# Patient Record
Sex: Male | Born: 1940 | Race: White | Hispanic: No | Marital: Married | Smoking: Never smoker
Health system: Southern US, Community
[De-identification: ages and names within clinical notes are randomized; demographics above are authoritative.]

## PROBLEM LIST (undated history)

## (undated) DIAGNOSIS — I872 Venous insufficiency (chronic) (peripheral): Secondary | ICD-10-CM

## (undated) DIAGNOSIS — M419 Scoliosis, unspecified: Secondary | ICD-10-CM

## (undated) DIAGNOSIS — M549 Dorsalgia, unspecified: Secondary | ICD-10-CM

## (undated) DIAGNOSIS — M1990 Unspecified osteoarthritis, unspecified site: Secondary | ICD-10-CM

## (undated) DIAGNOSIS — E039 Hypothyroidism, unspecified: Secondary | ICD-10-CM

## (undated) DIAGNOSIS — I480 Paroxysmal atrial fibrillation: Secondary | ICD-10-CM

## (undated) DIAGNOSIS — N419 Inflammatory disease of prostate, unspecified: Secondary | ICD-10-CM

## (undated) DIAGNOSIS — C069 Malignant neoplasm of mouth, unspecified: Secondary | ICD-10-CM

## (undated) DIAGNOSIS — I2510 Atherosclerotic heart disease of native coronary artery without angina pectoris: Secondary | ICD-10-CM

## (undated) DIAGNOSIS — T8859XA Other complications of anesthesia, initial encounter: Secondary | ICD-10-CM

## (undated) DIAGNOSIS — D696 Thrombocytopenia, unspecified: Secondary | ICD-10-CM

## (undated) HISTORY — PX: PILONIDAL CYST EXCISION: SHX744

## (undated) HISTORY — DX: Thrombocytopenia, unspecified: D69.6

## (undated) HISTORY — PX: SEPTOPLASTY: SUR1290

## (undated) HISTORY — DX: Malignant neoplasm of mouth, unspecified: C06.9

## (undated) HISTORY — PX: PROSTATE BIOPSY: SHX241

## (undated) HISTORY — PX: OTHER SURGICAL HISTORY: SHX169

## (undated) HISTORY — DX: Inflammatory disease of prostate, unspecified: N41.9

## (undated) HISTORY — PX: CATARACT EXTRACTION: SUR2

## (undated) HISTORY — DX: Venous insufficiency (chronic) (peripheral): I87.2

## (undated) HISTORY — PX: TONSILLECTOMY: SUR1361

## (undated) HISTORY — DX: Hypothyroidism, unspecified: E03.9

## (undated) HISTORY — DX: Paroxysmal atrial fibrillation: I48.0

## (undated) HISTORY — PX: LEFT HEART CATH: SHX5946

## (undated) HISTORY — DX: Atherosclerotic heart disease of native coronary artery without angina pectoris: I25.10

## (undated) HISTORY — DX: Scoliosis, unspecified: M41.9

## (undated) HISTORY — DX: Dorsalgia, unspecified: M54.9

## (undated) HISTORY — PX: KNEE ARTHROSCOPY: SUR90

---

## 2015-03-03 DIAGNOSIS — I82409 Acute embolism and thrombosis of unspecified deep veins of unspecified lower extremity: Secondary | ICD-10-CM

## 2015-03-03 HISTORY — DX: Acute embolism and thrombosis of unspecified deep veins of unspecified lower extremity: I82.409

## 2015-08-12 DIAGNOSIS — R002 Palpitations: Secondary | ICD-10-CM | POA: Insufficient documentation

## 2015-08-12 DIAGNOSIS — R609 Edema, unspecified: Secondary | ICD-10-CM | POA: Insufficient documentation

## 2015-08-12 DIAGNOSIS — G4733 Obstructive sleep apnea (adult) (pediatric): Secondary | ICD-10-CM | POA: Insufficient documentation

## 2015-08-12 DIAGNOSIS — R0683 Snoring: Secondary | ICD-10-CM | POA: Insufficient documentation

## 2015-08-12 DIAGNOSIS — I10 Essential (primary) hypertension: Secondary | ICD-10-CM | POA: Insufficient documentation

## 2015-08-12 DIAGNOSIS — I4891 Unspecified atrial fibrillation: Secondary | ICD-10-CM | POA: Insufficient documentation

## 2016-03-23 DIAGNOSIS — C4442 Squamous cell carcinoma of skin of scalp and neck: Secondary | ICD-10-CM | POA: Insufficient documentation

## 2016-04-13 DIAGNOSIS — C01 Malignant neoplasm of base of tongue: Secondary | ICD-10-CM | POA: Insufficient documentation

## 2017-11-02 DIAGNOSIS — R600 Localized edema: Secondary | ICD-10-CM | POA: Insufficient documentation

## 2018-10-03 DIAGNOSIS — Z923 Personal history of irradiation: Secondary | ICD-10-CM | POA: Insufficient documentation

## 2018-10-03 DIAGNOSIS — Z85819 Personal history of malignant neoplasm of unspecified site of lip, oral cavity, and pharynx: Secondary | ICD-10-CM | POA: Insufficient documentation

## 2018-12-19 ENCOUNTER — Telehealth: Payer: Self-pay | Admitting: Family Medicine

## 2018-12-19 NOTE — Telephone Encounter (Signed)
Patient has a new patient appointment on 12/26/18 with Dr.Duncan.  Patient wants to know if Dr.Duncan received records from [facility],patient's previous doctor.

## 2018-12-20 NOTE — Telephone Encounter (Signed)
I have the records.  Thanks.

## 2018-12-20 NOTE — Telephone Encounter (Signed)
Left detailed message on voicemail.  

## 2018-12-20 NOTE — Telephone Encounter (Signed)
Pt aware.

## 2018-12-26 ENCOUNTER — Other Ambulatory Visit: Payer: Self-pay

## 2018-12-26 ENCOUNTER — Ambulatory Visit (INDEPENDENT_AMBULATORY_CARE_PROVIDER_SITE_OTHER): Payer: Medicare Other | Admitting: Family Medicine

## 2018-12-26 ENCOUNTER — Encounter: Payer: Self-pay | Admitting: Family Medicine

## 2018-12-26 VITALS — BP 150/76 | HR 57 | Temp 97.5°F | Ht 70.0 in | Wt 165.2 lb

## 2018-12-26 DIAGNOSIS — I480 Paroxysmal atrial fibrillation: Secondary | ICD-10-CM

## 2018-12-26 DIAGNOSIS — E039 Hypothyroidism, unspecified: Secondary | ICD-10-CM

## 2018-12-26 DIAGNOSIS — C069 Malignant neoplasm of mouth, unspecified: Secondary | ICD-10-CM | POA: Diagnosis not present

## 2018-12-26 DIAGNOSIS — N419 Inflammatory disease of prostate, unspecified: Secondary | ICD-10-CM | POA: Diagnosis not present

## 2018-12-26 DIAGNOSIS — D696 Thrombocytopenia, unspecified: Secondary | ICD-10-CM | POA: Diagnosis not present

## 2018-12-26 DIAGNOSIS — Z0000 Encounter for general adult medical examination without abnormal findings: Secondary | ICD-10-CM

## 2018-12-26 DIAGNOSIS — Z7189 Other specified counseling: Secondary | ICD-10-CM

## 2018-12-26 DIAGNOSIS — R300 Dysuria: Secondary | ICD-10-CM

## 2018-12-26 DIAGNOSIS — I872 Venous insufficiency (chronic) (peripheral): Secondary | ICD-10-CM

## 2018-12-26 MED ORDER — CEFUROXIME AXETIL 500 MG PO TABS: 500.0000 mg | ORAL_TABLET | Freq: Two times a day (BID) | ORAL | 0 refills | Status: DC

## 2018-12-26 NOTE — Patient Instructions (Signed)
Urine culture today.  We'll call about urology and cardiology.  Update me as needed.  Start ceftin today.  Take care.  Glad to see you.

## 2018-12-26 NOTE — Progress Notes (Signed)
New patient.  Establishing care.  I will check back through his old records and update his health maintenance items.    Tetanus shot 2020. Flu shot 2020. Pneumonia shot up-to-date Shingrix up-to-date. Prostate cancer screening discussed with patient.  He has history of negative prostate biopsy.  He has a history of prostatitis.  Recent PSA is slightly elevated in spite of finasteride use.  See discussion of prostatitis below. Colonoscopy 2014. Living will discussed with patient.  Wife designated patient were incapacitated.  History of tongue cancer.  Followed at [facility].  No active treatment at this point.  No known active disease at this point.  He had dental exam this morning that was normal per his report without remarkable oral lesions noted.  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  Compliant.  Recent TSH minimally elevated.  Discussed with patient.  No adverse effect of medication.  No dysphagia.  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  History of A. fib.  He has occasional symptoms thought to be due to paroxysmal atrial fibrillation.  BP controlled at home.   He likely has a whitecoat component with his blood pressure.  His CHADS2 score is 1, when using his controlled home blood pressures for scoring.  Discussed with patient.  No chest pain, shortness of breath, edema.  He has not been on anticoagulation previously.  History of venous insufficiency noted and he wears a compression stocking on the right leg at baseline  History of prostatitis.  Required previous treatment with Ceftin.  Fluoroquinolone intolerant.  Over the last few weeks he has had burning with urination that was similar to previous episodes.  No fevers.  No chills.  PMH and SH reviewed  ROS: Per HPI unless specifically indicated in ROS section   Meds, vitals, and allergies reviewed.   GEN: nad, alert and oriented HEENT: Wearing a mask due to Covid  restrictions. ncat NECK: supple w/o LA CV: rrr.  PULM: ctab, no inc wob ABD: soft, +bs EXT: no edema, right leg and compression stocking. SKIN: no acute rash

## 2018-12-28 ENCOUNTER — Encounter: Payer: Self-pay | Admitting: Family Medicine

## 2018-12-28 DIAGNOSIS — I872 Venous insufficiency (chronic) (peripheral): Secondary | ICD-10-CM | POA: Insufficient documentation

## 2018-12-28 DIAGNOSIS — C069 Malignant neoplasm of mouth, unspecified: Secondary | ICD-10-CM | POA: Insufficient documentation

## 2018-12-28 DIAGNOSIS — Z7189 Other specified counseling: Secondary | ICD-10-CM | POA: Insufficient documentation

## 2018-12-28 DIAGNOSIS — I480 Paroxysmal atrial fibrillation: Secondary | ICD-10-CM | POA: Insufficient documentation

## 2018-12-28 DIAGNOSIS — D696 Thrombocytopenia, unspecified: Secondary | ICD-10-CM | POA: Insufficient documentation

## 2018-12-28 DIAGNOSIS — Z0000 Encounter for general adult medical examination without abnormal findings: Secondary | ICD-10-CM | POA: Insufficient documentation

## 2018-12-28 DIAGNOSIS — E039 Hypothyroidism, unspecified: Secondary | ICD-10-CM | POA: Insufficient documentation

## 2018-12-28 DIAGNOSIS — N419 Inflammatory disease of prostate, unspecified: Secondary | ICD-10-CM | POA: Insufficient documentation

## 2018-12-28 LAB — URINE CULTURE
MICRO NUMBER:: 1029578
SPECIMEN QUALITY:: ADEQUATE

## 2018-12-28 NOTE — Assessment & Plan Note (Signed)
Reasonable to check urine culture.  I expect this to be negative but still reasonable to check.  Discussed with patient.  Presumed prostatitis given his symptoms and history.  History of PSA elevation in spite of finasteride use, based on outside labs.  Did not recheck PSA today because of likely prostatitis.  Start Ceftin.  Refer to urology.  He agrees to plan. >30 minutes spent in face to face time with patient, >50% spent in counselling or coordination of care

## 2018-12-28 NOTE — Assessment & Plan Note (Signed)
Wife designated if patient were incapacitated.  

## 2018-12-28 NOTE — Assessment & Plan Note (Signed)
History of, noted, no bleeding.  We can follow this episodically.

## 2018-12-28 NOTE — Assessment & Plan Note (Signed)
CHADS2 score of 1 using his lower blood pressures at home.  No change in medication at this point.  Refer to cardiology.  He agrees with plan.

## 2018-12-28 NOTE — Assessment & Plan Note (Signed)
Tetanus shot 2020. Flu shot 2020. Pneumonia shot up-to-date Shingrix up-to-date. Prostate cancer screening discussed with patient.  He has history of negative prostate biopsy.  He has a history of prostatitis.  Recent PSA is slightly elevated in spite of finasteride use.  See discussion of prostatitis below. Colonoscopy 2014. Living will discussed with patient.  Wife designated patient were incapacitated.

## 2018-12-28 NOTE — Assessment & Plan Note (Signed)
Continue using compression stocking as needed.

## 2018-12-28 NOTE — Assessment & Plan Note (Signed)
TSH minimally elevated on recent check.  No change in meds at this point.  I do not want overtreat his hypothyroidism and I prefer him to see cardiology before we change any of his medications around.  He agrees.  See paroxysmal atrial fibrillation discussion.

## 2018-12-28 NOTE — Assessment & Plan Note (Signed)
Followed by [facility] cancer clinic.  I will defer.  He agrees.

## 2018-12-29 ENCOUNTER — Ambulatory Visit: Payer: Medicare Other | Admitting: Cardiology

## 2018-12-30 ENCOUNTER — Encounter: Payer: Self-pay | Admitting: Cardiology

## 2018-12-30 ENCOUNTER — Ambulatory Visit (INDEPENDENT_AMBULATORY_CARE_PROVIDER_SITE_OTHER): Payer: Medicare Other | Admitting: Cardiology

## 2018-12-30 ENCOUNTER — Other Ambulatory Visit: Payer: Self-pay

## 2018-12-30 ENCOUNTER — Ambulatory Visit (INDEPENDENT_AMBULATORY_CARE_PROVIDER_SITE_OTHER): Payer: Medicare Other

## 2018-12-30 VITALS — BP 160/82 | HR 51 | Ht 70.5 in | Wt 167.5 lb

## 2018-12-30 DIAGNOSIS — R030 Elevated blood-pressure reading, without diagnosis of hypertension: Secondary | ICD-10-CM

## 2018-12-30 DIAGNOSIS — I480 Paroxysmal atrial fibrillation: Secondary | ICD-10-CM

## 2018-12-30 DIAGNOSIS — I77810 Thoracic aortic ectasia: Secondary | ICD-10-CM | POA: Diagnosis not present

## 2018-12-30 DIAGNOSIS — I340 Nonrheumatic mitral (valve) insufficiency: Secondary | ICD-10-CM | POA: Diagnosis not present

## 2018-12-30 MED ORDER — APIXABAN 5 MG PO TABS: 5.0000 mg | ORAL_TABLET | Freq: Two times a day (BID) | ORAL | 5 refills | Status: DC

## 2018-12-30 NOTE — Progress Notes (Signed)
Cardiology Office Note:    Date:  12/30/2018   ID:  Norman Mitchell, DOB Jan 11, 1941, MRN [id number]  PCP:  Tonia Ghent, MD  Cardiologist:  Kate Sable, MD  Electrophysiologist:  None   Referring MD: Tonia Ghent, MD   Chief Complaint  Patient presents with   other    Ref by Dr. Damita Dunnings for arrhythmia.Echo 10/2016 @ [facility] in [place], [place]. Meds reviewed by the pt. verbally.  Pt. c/o palpitatioins at rest.     History of Present Illness:    Norman Mitchell is a 78 y.o. male with a hx of DVT, paroxysmal atrial fibrillation (diagnosed May 2017) who presents due to history of atrial fibrillation.  Patient has been having palpitation for years, eventually an EKG done at his primary care provider's office in Jul 02, 2015 showed atrial fibrillation with a ventricular rate of 101.  He was later on referred to see a cardiologist, and was evaluated by Dr. Nash Dimmer in June 2017.Marland Kitchen  At the time his CHA2DS2-VASc score was 1.  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  He was managed with anticoagulation for 3 months with resolution.  He was later diagnosed with throat cancer and his malignancy was thought to cause the DVT.  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  Patient feels fine, states having palpitations once or twice a month, denies chest pain, dizziness, edema.  He is a never smoker.  States his blood pressure at home usually in the 120s to 130s.  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   Paroxysmal atrial fibrillation (HCC)    Prostatitis    Thrombocytopenia (HCC)    Venous insufficiency     Past Surgical  History:  Procedure Laterality Date   KNEE ARTHROSCOPY     Oral biopsy     PILONIDAL CYST EXCISION     PROSTATE BIOPSY     Negative   SEPTOPLASTY     TONSILLECTOMY      Current Medications: Current Meds  Medication Sig   albuterol (VENTOLIN HFA) 108 (90 Base) MCG/ACT inhaler Inhale into the lungs as needed.    alfuzosin (UROXATRAL) 10 MG 24 hr tablet Take by mouth daily.    aspirin EC 81 MG tablet Take 81 mg by mouth daily.    cefUROXime (CEFTIN) 500 MG tablet Take 1 tablet (500 mg total) by mouth 2 (two) times daily with a meal.   CRANBERRY EXTRACT PO Take 500 mg by mouth 3 (three) times a week.    Fiber, Corn Dextrin, POWD Take 2 tsp per day   finasteride (PROSCAR) 5 MG tablet Take 5 mg by mouth daily.    levothyroxine (SYNTHROID) 75 MCG tablet Take 75 mcg by mouth daily.    Lutein 20 MG CAPS Take by mouth.   Multiple Vitamins-Minerals (SENIOR MULTIVITAMIN PLUS PO)    NON FORMULARY Cocoavia 750 mg daily.   Omega-3 1000 MG CAPS Take by mouth 3 (three) times daily.      Allergies:   Sulfamethoxazole-trimethoprim, Ciprofloxacin, Ibuprofen, Levofloxacin, and Sulfa antibiotics   Social History   Socioeconomic History   Marital status: Married    Spouse name:  Not on file   Number of children: Not on file   Years of education: Not on file   Highest education level: Not on file  Occupational History   Not on file  Social Needs   Financial resource strain: Not on file   Food insecurity    Worry: Not on file    Inability: Not on file   Transportation needs    Medical: Not on file    Non-medical: Not on file  Tobacco Use   Smoking status: Never Smoker   Smokeless tobacco: Never Used  Substance and Sexual Activity   Alcohol use: Never    Frequency: Never   Drug use: Never   Sexual activity: Not on file  Lifestyle   Physical activity    Days per week: Not on file    Minutes per session: Not on file   Stress: Not on file  Relationships    Social connections    Talks on phone: Not on file    Gets together: Not on file    Attends religious service: Not on file    Active member of club or organization: Not on file    Attends meetings of clubs or organizations: Not on file    Relationship status: Not on file  Other Topics Concern   Not on file  Social History Narrative   From [place], [place].    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   [employer] [phone] through 1972.  87.  No service related issues.   Enjoys hiking, paining, Scientist, water quality, woodworking   Lives at [facility].     Family History: The patient's family history includes Heart disease in his mother; High Cholesterol in his father; High blood pressure in his father; Prostate cancer in his paternal grandfather; Stroke in his father. There is no history of Colon cancer.  ROS:   Please see the history of present illness.     All other systems reviewed and are negative.  EKGs/Labs/Other Studies Reviewed:    The following studies were reviewed today: Echocardiogram date 11/16/2016 Summary:  1. The left ventricular chamber size is normal. There is normal left ventricular systolic function. The EF is estimated at 60-65%. 2. There is mild to moderate mitral and tricuspid regurgitation. 3. There is mild dilatation of the ascending aorta.-4.0 cm. 4. There is a trivial amount of aortic regurgitation. 5. Compared to prior report of 2017, there are no sig changes.   Findings     Technical Comments: The study quality is fair.    Left Ventricle: The left ventricular chamber size is normal. There is normal left ventricular systolic function. The EF is estimated at 60-65%.   Left Atrium: The left atrium is mildly dilated.   Right Ventricle: The right ventricular cavity size is normal. The right ventricular global systolic function is normal.   Right Atrium: The right atrium is mild to  moderately dilated.    Aortic Valve: The aortic valve is trileaflet. There is mild sclerosis of the aortic valve cusps. There is a trivial amount of aortic regurgitation. There is no evidence of aortic stenosis.   Mitral Valve: There is mild mitral leaflet thickening. There is mild to moderate mitral regurgitation observed.   Tricuspid Valve: The tricuspid valve is redundant. There is mild to moderate tricuspid regurgitation. The right ventricular systolic pressure is calculated at ( )mmHg.   Pulmonic Valve: The pulmonic valve is not well visualized.  There is mild pulmonic regurgitation present.   Pericardium: The pericardium appears normal. There is no pericardial effusion.   Aorta: The aortic root appears normal. There is mild dilatation of the ascending aorta.   EKG:  EKG is  ordered today.  The ekg ordered today demonstrates sinus bradycardia, right bundle branch block.  Recent Labs: No results found for requested labs within last 8760 hours.  Recent Lipid Panel No results found for: CHOL, TRIG, HDL, CHOLHDL, VLDL, LDLCALC, LDLDIRECT  Physical Exam:    VS:  BP (!) 160/82 (BP Location: Right Arm, Patient Position: Sitting, Cuff Size: Normal)    Pulse (!) 51    Ht 5' 10.5" (1.791 m)    Wt 167 lb 8 oz (76 kg)    BMI 23.69 kg/m     Wt Readings from Last 3 Encounters:  12/30/18 167 lb 8 oz (76 kg)  12/26/18 165 lb 3 oz (74.9 kg)     GEN:  Well nourished, well developed in no acute distress HEENT: Normal NECK: No JVD; No carotid bruits LYMPHATICS: No lymphadenopathy CARDIAC: Regular, bradycardic, no murmurs, rubs, gallops RESPIRATORY:  Clear to auscultation without rales, wheezing or rhonchi  ABDOMEN: Soft, non-tender, non-distended MUSCULOSKELETAL:  No edema; No deformity  SKIN: Warm and dry NEUROLOGIC:  Alert and oriented x 3 PSYCHIATRIC:  Normal affect   ASSESSMENT:   Patient has elevated blood pressure in the office.  His blood pressure at  home is usually in the 120s to 130s.  Patient may have whitecoat hypertension. His chads vas score is at least 2 (age-2, ?hypertension-1).  He is currently in sinus bradycardia with heart rate 51.  1. PAF (paroxysmal atrial fibrillation) ([place])   2. Mitral valve insufficiency, unspecified etiology   3. 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 Start Eliquis 5 mg twice daily.  Get echocardiogram.  Get Zio patch to document A. fib burden. 2.  Echocardiogram as above. 3.  Echocardiogram as above. 4.  General check blood pressure daily and keep a log.  If blood pressures elevated at home we will plan to start an antihypertensive.  Total encounter time more than 60 minutes  Greater than 50% was spent in counseling and coordination of care with the patient  This note was generated in part or whole with voice recognition software. Voice recognition is usually quite accurate but there are transcription errors that can and very often do occur. I apologize for any typographical errors that were not detected and corrected.   Medication Adjustments/Labs and Tests Ordered: Current medicines are reviewed at length with the patient today.  Concerns regarding medicines are outlined above.  Orders Placed This Encounter  Procedures   LONG TERM MONITOR (3-14 DAYS)   EKG 12-Lead   ECHOCARDIOGRAM COMPLETE   Meds ordered this encounter  Medications   apixaban (ELIQUIS) 5 MG TABS tablet    Sig: Take 1 tablet (5 mg total) by mouth 2 (two) times daily.    Dispense:  60 tablet    Refill:  5    Patient Instructions  Medication Instructions:  Your physician has recommended you make the following change in your medication:   START Eliquis 5mg  twice daily. An Rx has been sent to your pharmacy.  *If you need a refill on your cardiac medications before your next appointment, please call your pharmacy*  Lab Work: None ordered If you have  labs (blood work) drawn  today and your tests are completely normal, you will receive your results only by:  MyChart Message (if you have MyChart) OR  A paper copy in the mail If you have any lab test that is abnormal or we need to change your treatment, we will call you to review the results.  Testing/Procedures: Your physician has requested that you have an echocardiogram. Echocardiography is a painless test that uses sound waves to create images of your heart. It provides your doctor with information about the size and shape of your heart and how well your hearts chambers and valves are working. This procedure takes approximately one hour. There are no restrictions for this procedure.  Your physician has recommended that you wear an zio monitor. Zio monitors are medical devices that record the hearts electrical activity. Doctors most often [place] these monitors to diagnose arrhythmias. Arrhythmias are problems with the speed or rhythm of the heartbeat. The monitor is a small, portable device. You can wear one while you do your normal daily activities. This is usually used to diagnose what is causing palpitations/syncope (passing out).    Follow-Up: At [facility], you and your health needs are our priority.  As part of our continuing mission to provide you with exceptional heart care, we have created designated Provider Care Teams.  These Care Teams include your primary Cardiologist (physician) and Advanced Practice Providers (APPs -  Physician Assistants and Nurse Practitioners) who all work together to provide you with the care you need, when you need it.  Your next appointment:   2 months  The format for your next appointment:   In Person  Provider:    You may see Kate Sable, MD or one of the following Advanced Practice Providers on your designated Care Team:    Murray Hodgkins, NP  Christell Faith, PA-C  Marrianne Mood, PA-C   Other Instructions  Your physician has  recommended that you wear a Zio monitor. This monitor is a medical device that records the hearts electrical activity. Doctors most often use these monitors to diagnose arrhythmias. Arrhythmias are problems with the speed or rhythm of the heartbeat. The monitor is a small device applied to your chest. You can wear one while you do your normal daily activities. While wearing this monitor if you have any symptoms to push the button and record what you felt. Once you have worn this monitor for the period of time provider prescribed (Usually 14 days), you will return the monitor device in the postage paid box. Once it is returned they will download the data collected and provide [place] with a report which the provider will then review and we will call you with those results. Important tips:  1. Avoid showering during the first 24 hours of wearing the monitor. 2. Avoid excessive sweating to help maximize wear time. 3. Do not submerge the device, no hot tubs, and no swimming pools. 4. Keep any lotions or oils away from the patch. 5. After 24 hours you may shower with the patch on. Take brief showers with your back facing the shower head.  6. Do not remove patch once it has been placed because that will interrupt data and decrease adhesive wear time. 7. Push the button when you have any symptoms and write down what you were feeling. 8. Once you have completed wearing your monitor, remove and place into box which has postage paid and place in your outgoing mailbox.  9. If for some reason you have misplaced your  box then call our office and we can provide another box and/or mail it off for you.           Signed, Kate Sable, MD  12/30/2018 3:10 PM    [facility]

## 2018-12-30 NOTE — Patient Instructions (Signed)
Medication Instructions:  Your physician has recommended you make the following change in your medication:   START Eliquis 5mg  twice daily. An Rx has been sent to your pharmacy.  *If you need a refill on your cardiac medications before your next appointment, please call your pharmacy*  Lab Work: None ordered If you have labs (blood work) drawn today and your tests are completely normal, you will receive your results only by: Marland Kitchen MyChart Message (if you have MyChart) OR . A paper copy in the mail If you have any lab test that is abnormal or we need to change your treatment, we will call you to review the results.  Testing/Procedures: Your physician has requested that you have an echocardiogram. Echocardiography is a painless test that uses sound waves to create images of your heart. It provides your doctor with information about the size and shape of your heart and how well your heart's chambers and valves are working. This procedure takes approximately one hour. There are no restrictions for this procedure.  Your physician has recommended that you wear an zio monitor. Zio monitors are medical devices that record the heart's electrical activity. Doctors most often [place] these monitors to diagnose arrhythmias. Arrhythmias are problems with the speed or rhythm of the heartbeat. The monitor is a small, portable device. You can wear one while you do your normal daily activities. This is usually used to diagnose what is causing palpitations/syncope (passing out).    Follow-Up: At [facility], you and your health needs are our priority.  As part of our continuing mission to provide you with exceptional heart care, we have created designated Provider Care Teams.  These Care Teams include your primary Cardiologist (physician) and Advanced Practice Providers (APPs -  Physician Assistants and Nurse Practitioners) who all work together to provide you with the care you need, when you need it.  Your next  appointment:   2 months  The format for your next appointment:   In Person  Provider:    You may see Kate Sable, MD or one of the following Advanced Practice Providers on your designated Care Team:    Murray Hodgkins, NP  Christell Faith, PA-C  Marrianne Mood, PA-C   Other Instructions  Your physician has recommended that you wear a Zio monitor. This monitor is a medical device that records the heart's electrical activity. Doctors most often use these monitors to diagnose arrhythmias. Arrhythmias are problems with the speed or rhythm of the heartbeat. The monitor is a small device applied to your chest. You can wear one while you do your normal daily activities. While wearing this monitor if you have any symptoms to push the button and record what you felt. Once you have worn this monitor for the period of time provider prescribed (Usually 14 days), you will return the monitor device in the postage paid box. Once it is returned they will download the data collected and provide [place] with a report which the provider will then review and we will call you with those results. Important tips:  1. Avoid showering during the first 24 hours of wearing the monitor. 2. Avoid excessive sweating to help maximize wear time. 3. Do not submerge the device, no hot tubs, and no swimming pools. 4. Keep any lotions or oils away from the patch. 5. After 24 hours you may shower with the patch on. Take brief showers with your back facing the shower head.  6. Do not remove patch once it has been  placed because that will interrupt data and decrease adhesive wear time. 7. Push the button when you have any symptoms and write down what you were feeling. 8. Once you have completed wearing your monitor, remove and place into box which has postage paid and place in your outgoing mailbox.  9. If for some reason you have misplaced your box then call our office and we can provide another box and/or mail it off for  you.

## 2019-01-03 ENCOUNTER — Encounter: Payer: Self-pay | Admitting: Family Medicine

## 2019-01-03 LAB — LAB REPORT - SCANNED
ALT: 18 (ref 3–30)
Cholesterol: 123
Creatinine, Ser: 1.25
Glucose: 91
HDL: 55
Hemoglobin: 14.8
LDL (calc): 53
Platelets: 114
Prostate Specific Ag, Serum: 3.4
SGOT(AST): 28
TSH: 4.89
Triglycerides: 72 (ref 40–160)
WBC: 3.8

## 2019-01-05 ENCOUNTER — Encounter: Payer: Self-pay | Admitting: Family Medicine

## 2019-01-05 ENCOUNTER — Ambulatory Visit (INDEPENDENT_AMBULATORY_CARE_PROVIDER_SITE_OTHER): Payer: Medicare Other | Admitting: Family Medicine

## 2019-01-05 ENCOUNTER — Other Ambulatory Visit: Payer: Self-pay

## 2019-01-05 VITALS — BP 144/80 | HR 57 | Temp 98.1°F | Ht 70.5 in | Wt 166.1 lb

## 2019-01-05 DIAGNOSIS — I480 Paroxysmal atrial fibrillation: Secondary | ICD-10-CM

## 2019-01-05 DIAGNOSIS — M419 Scoliosis, unspecified: Secondary | ICD-10-CM | POA: Diagnosis not present

## 2019-01-05 DIAGNOSIS — N419 Inflammatory disease of prostate, unspecified: Secondary | ICD-10-CM

## 2019-01-05 DIAGNOSIS — I77810 Thoracic aortic ectasia: Secondary | ICD-10-CM

## 2019-01-05 DIAGNOSIS — D696 Thrombocytopenia, unspecified: Secondary | ICD-10-CM

## 2019-01-05 MED ORDER — CEFUROXIME AXETIL 500 MG PO TABS: 500.0000 mg | ORAL_TABLET | Freq: Two times a day (BID) | ORAL | 0 refills | Status: DC

## 2019-01-05 NOTE — Progress Notes (Signed)
Dysuria/prostatitis.  He had improvement on abx, was clearly improved until recently.  It isn't back to baseline.  He just finished his abx a few days ago and sx returned in the meantime.  No FCNAVD.  He has not regressed to the point where his symptoms were as notable as the last office visit.  He was asking if he can get set up with Dr. Louis Meckel or Dr. Alinda Money at [facility] urology.  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  He has been able to manage his back pain with massage therapy and he was asking about options for that.  He has a history of mild dilatation of the ascending aorta previously noted on imaging done at [facility].  Per patient report he had follow-up imaging 1 year later.  He seen cardiology in the meantime and he has a follow-up echo pending.  He is seen cardiology.  He has mildly low platelets.  He was asking about anticoagulation, risk versus benefit.  He does not have bleeding history otherwise.  He is not having chest pain.  He has echo pending.  He is wearing a patch/monitor currently to evaluate A. Fib.  He presented outside imaging and PET scan.  See scanned forms.  Reviewed with patient at office visit.  PMH and SH reviewed  ROS: Per HPI unless specifically indicated in ROS section   Meds, vitals, and allergies reviewed.   GEN: nad, alert and oriented HEENT: ncat NECK: supple w/o LA CV: rrr. PULM: ctab, no inc wob ABD: soft, +bs EXT: no edema SKIN: no acute rash

## 2019-01-05 NOTE — Patient Instructions (Signed)
Restart ceftin and update me as needed.  I'll check on the massage and urology appointments.  Take care.  Glad to see you.  I'll await the cardiology notes.

## 2019-01-06 ENCOUNTER — Telehealth: Payer: Self-pay | Admitting: Cardiology

## 2019-01-06 NOTE — Telephone Encounter (Signed)
Patient calling  Patient was started on Eliquis recently  Patient would like to know if there is a free trial card or coupons he can use in order to avoid an expensive prescription payment until next year Please call to discuss

## 2019-01-06 NOTE — Telephone Encounter (Signed)
Spoke with patient. He has not met his deductible for this year and would have to pay over $400 for his Eliquis fofr the remainder of the year. In the new year, he will plan and be able to start paying towards that deductible.  He has not used a 30 day free card yet.  We decided I would send him a MyChart message with the card information for him to activate and take to his pharmacy.  He also has 2 weeks of samples from last office visit.  Advised we could supply him with an additional 2 weeks of samples to get him through the end of the year. He was very appreciative and will call us back in the first week or 2 of December to ask for them. He will let us know if any further issues arise.

## 2019-01-08 ENCOUNTER — Encounter: Payer: Self-pay | Admitting: Family Medicine

## 2019-01-09 ENCOUNTER — Encounter: Payer: Self-pay | Admitting: Family Medicine

## 2019-01-09 DIAGNOSIS — M419 Scoliosis, unspecified: Secondary | ICD-10-CM | POA: Insufficient documentation

## 2019-01-09 DIAGNOSIS — I77810 Thoracic aortic ectasia: Secondary | ICD-10-CM | POA: Insufficient documentation

## 2019-01-09 NOTE — Assessment & Plan Note (Signed)
History of mild thrombocytopenia and I do not think this will be a contraindication to anticoagulation as planned per cardiology.  Discussed with patient.

## 2019-01-09 NOTE — Assessment & Plan Note (Signed)
[facility]er to [facility] per patient request.  See above.  Restart Ceftin.  He will update me if not better in the meantime.  He will likely need a longer course of antibiotics, see orders.

## 2019-01-09 NOTE — Assessment & Plan Note (Signed)
Massage has been helpful previously for patient and I will check on options locally.

## 2019-01-09 NOTE — Assessment & Plan Note (Signed)
Previously noted and I think this can be reassessed on echo.  I will update cardiology.

## 2019-01-09 NOTE — Assessment & Plan Note (Signed)
He is wearing a monitor currently and has echo pending.  We talked about the risk and benefit of anticoagulation but I think it makes sense to proceed at this point.

## 2019-01-13 DIAGNOSIS — I480 Paroxysmal atrial fibrillation: Secondary | ICD-10-CM | POA: Diagnosis not present

## 2019-01-23 ENCOUNTER — Other Ambulatory Visit: Payer: Self-pay

## 2019-01-24 ENCOUNTER — Encounter: Payer: Self-pay | Admitting: Family Medicine

## 2019-01-31 ENCOUNTER — Encounter: Payer: Self-pay | Admitting: Family Medicine

## 2019-02-02 ENCOUNTER — Ambulatory Visit: Payer: Medicare Other | Admitting: Urology

## 2019-02-02 ENCOUNTER — Telehealth: Payer: Self-pay

## 2019-02-02 MED ORDER — CEFUROXIME AXETIL 500 MG PO TABS: 500.0000 mg | ORAL_TABLET | Freq: Two times a day (BID) | ORAL | 0 refills | Status: DC

## 2019-02-02 NOTE — Telephone Encounter (Signed)
Patient left a message on triage line stating that he was to tell Dr Damita Dunnings if his symptoms of prostatitis came back. Patient states symptoms of burning and discolored ejaculate is back. The urine flow is strong still. Wanted to know what to do at this time.

## 2019-02-02 NOTE — Telephone Encounter (Signed)
Left message on patient's voicemail to return call with information. 

## 2019-02-02 NOTE — Telephone Encounter (Signed)
Would restart abx and get him set up with urology.   rx sent.  He has referral to uro.  When is his appointment?  Please let me know.  Thanks.

## 2019-02-03 NOTE — Telephone Encounter (Signed)
[facility] Night - Client Nonclinical Telephone Record AccessNurse Client [facility] Night - Client Client Site [place] Physician Renford Dills - MD Contact Type Call Who Is Calling Patient / Member / Family / Caregiver Caller Name Felder Criscione Caller Phone Number [phone] Call Type Message Only Information Provided Reason for Call Returning a Call from the Office Initial [place] states he received a call from the nurse asking what date his appt is with the urologist Additional Comment Caller states he received a call from the nurse asking when his appt is scheduled with the urologist, and he was returning the call, that appt is scheduled for 02/13/2019 Disp. Time Disposition Final User 02/02/2019 5:51:25 PM General Information Provided Yes Virgil Benedict Call Closed By: Virgil Benedict Transaction Date/Time: 02/02/2019 5:47:44 PM (ET)

## 2019-02-03 NOTE — Telephone Encounter (Signed)
that appt is scheduled for 02/13/2019

## 2019-02-03 NOTE — Telephone Encounter (Signed)
Noted. The current rx should carry him through that appointment and I'll await the update from urology.  Update me as needed in the meantime o/w.  Thanks.

## 2019-02-04 ENCOUNTER — Encounter: Payer: Self-pay | Admitting: Family Medicine

## 2019-02-09 ENCOUNTER — Telehealth: Payer: Self-pay | Admitting: Cardiology

## 2019-02-09 NOTE — Telephone Encounter (Signed)
Patient calling the office for samples of medication:   1.  What medication and dosage are you requesting samples for?  Eliquis 5 mg po q d   2.  Are you currently out of this medication? No but needs 2 weeks to get to end of year for insurance cost issue

## 2019-02-09 NOTE — Telephone Encounter (Signed)
Patient notified samples of Eliquis 5 mg available of 1 Box of 14 tabs only.  Medication Samples have been provided to the patient.  Drug name: Eliquis      Strength: 5 mg       Qty: 14 tablets  LOT: [id number] Exp.Date: 01/2021   Dolores Lory 10:52 AM 02/09/2019

## 2019-02-16 ENCOUNTER — Telehealth: Payer: Self-pay | Admitting: Cardiology

## 2019-02-16 NOTE — Telephone Encounter (Signed)
Patient has been informed that samples will be in front office for him to pick up at his convenience.

## 2019-02-16 NOTE — Telephone Encounter (Signed)
Medication Samples have been provided to the patient.  Drug name: Eliquis      Strength: 5mg         Qty: 14  [id number]  Exp.Date: Jan 2023  Dosing instructions: one tablet PO BID  Rene Paci McClain 10:12 AM 02/16/2019

## 2019-02-16 NOTE — Telephone Encounter (Signed)
Patient calling the office for samples of medication:   1.  What medication and dosage are you requesting samples for? Eliquis 5 mg daily  2.  Are you currently out of this medication?  Yes  Patient states he needs one more sample box to get him through the year.

## 2019-02-22 ENCOUNTER — Telehealth: Payer: Self-pay

## 2019-02-22 NOTE — Telephone Encounter (Signed)
Pt unable to reach. ECHO not scheduled. Pt has an F/U w/ Dr. Garen Lah for 02/28/19. Does pt need to reschedule apt?

## 2019-02-23 ENCOUNTER — Encounter: Payer: Self-pay | Admitting: Family Medicine

## 2019-02-27 ENCOUNTER — Encounter: Payer: Self-pay | Admitting: Family Medicine

## 2019-02-28 ENCOUNTER — Ambulatory Visit (INDEPENDENT_AMBULATORY_CARE_PROVIDER_SITE_OTHER): Payer: Medicare Other | Admitting: Cardiology

## 2019-02-28 ENCOUNTER — Encounter: Payer: Self-pay | Admitting: Cardiology

## 2019-02-28 ENCOUNTER — Other Ambulatory Visit: Payer: Self-pay

## 2019-02-28 VITALS — BP 160/86 | HR 55 | Ht 70.0 in | Wt 170.5 lb

## 2019-02-28 DIAGNOSIS — I340 Nonrheumatic mitral (valve) insufficiency: Secondary | ICD-10-CM

## 2019-02-28 DIAGNOSIS — I480 Paroxysmal atrial fibrillation: Secondary | ICD-10-CM

## 2019-02-28 DIAGNOSIS — R030 Elevated blood-pressure reading, without diagnosis of hypertension: Secondary | ICD-10-CM | POA: Diagnosis not present

## 2019-02-28 DIAGNOSIS — I77810 Thoracic aortic ectasia: Secondary | ICD-10-CM

## 2019-02-28 NOTE — Progress Notes (Addendum)
Cardiology Office Note:    Date:  02/28/2019   ID:  Norman Mitchell, DOB 06-Nov-1940, MRN [id number]  PCP:  Tonia Ghent, MD  Cardiologist:  Kate Sable, MD  Electrophysiologist:  None   Referring MD: Tonia Ghent, MD   Chief Complaint  Patient presents with  . other    2 month f/u pt hasn't had echo no complaints today. Meds reviewed verbally with pt.    History of Present Illness:    Norman Mitchell is a 78 y.o. male with a hx of DVT, paroxysmal atrial fibrillation (diagnosed May 2017) who presents for follow-up.  He was previously seen due to history of atrial fibrillation.    Patient had palpitations for years, eventually an EKG done at his primary care provider's office in Jul 02, 2015 showed atrial fibrillation with a ventricular rate of 101.  He was later on referred to see a cardiologist, and was evaluated by Dr. Nash Dimmer in June 2017 at [facility].  At the time his CHA2DS2-VASc score was 1.  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  He was managed with anticoagulation for 3 months with resolution.  He was later diagnosed with throat cancer and his malignancy was thought to cause the DVT.  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  Patient feels fine, states having palpitations once or twice a month, denies chest pain, dizziness, edema.  He is a never smoker. blood pressure at home usually in the 120s to 130s.  Cardiac monitor was ordered to evaluate A. fib burden, echocardiogram ordered to evaluate mitral regurgitation and ascending aorta due to history of dilatation.  Echocardiogram has not yet been performed. Past Medical History:  Diagnosis Date  . Back pain   .  Hypothyroidism   . 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  . Paroxysmal atrial fibrillation ([place])   . Prostatitis   . Scoliosis   . Thrombocytopenia ([place])   . Venous insufficiency     Past Surgical History:  Procedure Laterality Date  . KNEE ARTHROSCOPY    . Oral biopsy    . PILONIDAL CYST EXCISION    . PROSTATE BIOPSY     Negative  . SEPTOPLASTY    . TONSILLECTOMY      Current Medications: Current Meds  Medication Sig  . albuterol (VENTOLIN HFA) 108 (90 Base) MCG/ACT inhaler Inhale into the lungs as needed.   Marland Kitchen alfuzosin (UROXATRAL) 10 MG 24 hr tablet Take by mouth daily.   Marland Kitchen apixaban (ELIQUIS) 5 MG TABS tablet Take 1 tablet (5 mg total) by mouth 2 (two) times daily.  Marland Kitchen CRANBERRY EXTRACT PO Take 500 mg by mouth 3 (three) times a week.   . Fiber, Corn Dextrin, POWD Take 2 tsp per day  . finasteride (PROSCAR) 5 MG tablet Take 5 mg by mouth daily.   Marland Kitchen levothyroxine (SYNTHROID) 75 MCG tablet Take 75 mcg by mouth daily.   . Lutein 20 MG CAPS Take by mouth.  . Multiple Vitamins-Minerals (SENIOR MULTIVITAMIN PLUS PO)   . NON FORMULARY Cocoavia 750 mg daily.  . Omega-3 1000 MG CAPS Take by mouth 3 (three) times daily.      Allergies:   Sulfamethoxazole-trimethoprim, Ciprofloxacin, Ibuprofen, and Levofloxacin  Social History   Socioeconomic History  . Marital status: Married    Spouse name: Not on file  . Number of children: Not on file  . Years of education: Not on file  . Highest education level: Not on file  Occupational History  . Not on file  Tobacco Use  . Smoking status: Never Smoker  . Smokeless tobacco: Never Used  Substance and Sexual Activity  . Alcohol use: Never  . Drug use: Never  . Sexual activity: Not on file  Other Topics Concern  . Not on file  Social History Narrative   From [place], [place].    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   [employer] [phone] through 1972.  87.  No service  related issues.   Enjoys hiking, paining, Scientist, water quality, woodworking   Lives at [facility].   Social Determinants of Health   Financial Resource Strain:   . Difficulty of Paying Living Expenses: Not on file  Food Insecurity:   . Worried About Charity fundraiser in the Last Year: Not on file  . Ran Out of Food in the Last Year: Not on file  Transportation Needs:   . Lack of Transportation (Medical): Not on file  . Lack of Transportation (Non-Medical): Not on file  Physical Activity:   . Days of Exercise per Week: Not on file  . Minutes of Exercise per Session: Not on file  Stress:   . Feeling of Stress : Not on file  Social Connections:   . Frequency of Communication with Friends and Family: Not on file  . Frequency of Social Gatherings with Friends and Family: Not on file  . Attends Religious Services: Not on file  . Active Member of Clubs or Organizations: Not on file  . Attends Archivist Meetings: Not on file  . Marital Status: Not on file     Family History: The patient's family history includes Heart disease in his mother; High Cholesterol in his father; High blood pressure in his father; Prostate cancer in his paternal grandfather; Stroke in his father. There is no history of Colon cancer.  ROS:   Please see the history of present illness.     All other systems reviewed and are negative.  EKGs/Labs/Other Studies Reviewed:    The following studies were reviewed today:  2-week cardiac monitor 01/23/2019 Patient had a min HR of 34 bpm, max HR of 143 bpm, and avg HR of 52 bpm. Predominant underlying rhythm was Sinus Rhythm. 1 run of Ventricular Tachycardia occurred lasting 8 beats with a max rate of 138 bpm (avg 126 bpm).  Occasional Supraventricular Tachycardia runs occurred, the run with the fastest interval lasting 5 beats with a max rate of 143 bpm.. Supraventricular Tachycardia was detected within +/- 45 seconds of symptomatic patient event(s). Isolated  SVEs were occasional (2.6%, [id number]), SVE Couplets were rare (<1.0%, 566), and SVE Triplets were rare (<1.0%, 47). Isolated VEs were rare (<1.0%), VE Couplets were rare (<1.0%), and no VE Triplets were present. Ventricular Bigeminy was present.  Echocardiogram date 11/16/2016 Summary:  1. The left ventricular chamber size is normal. There is normal left ventricular systolic function. The EF is estimated at 60-65%. 2. There is mild to moderate mitral and tricuspid regurgitation. 3. There is mild dilatation of the ascending aorta.-4.0 cm. 4. There is a trivial amount of aortic regurgitation. 5. Compared to prior report of 2017, there are no sig  changes.  Findings     Technical Comments: The study quality is fair.    Left Ventricle: The left ventricular chamber size is normal. There is normal left ventricular systolic function. The EF is estimated at 60-65%.   Left Atrium: The left atrium is mildly dilated.   Right Ventricle: The right ventricular cavity size is normal. The right ventricular global systolic function is normal.   Right Atrium: The right atrium is mild to moderately dilated.    Aortic Valve: The aortic valve is trileaflet. There is mild sclerosis of the aortic valve cusps. There is a trivial amount of aortic regurgitation. There is no evidence of aortic stenosis.   Mitral Valve: There is mild mitral leaflet thickening. There is mild to moderate mitral regurgitation observed.   Tricuspid Valve: The tricuspid valve is redundant. There is mild to moderate tricuspid regurgitation. The right ventricular systolic pressure is calculated at ( )mmHg.   Pulmonic Valve: The pulmonic valve is not well visualized. There is mild pulmonic regurgitation present.   Pericardium: The pericardium appears normal. There is no pericardial effusion.   Aorta: The aortic root appears normal. There is mild dilatation of the ascending aorta.   EKG:   EKG is  ordered today.  The ekg ordered today demonstrates sinus bradycardia, right bundle branch block.  Recent Labs: 11/28/2018: ALT 18; Creatinine, Ser 1.25; Platelets 114; TSH 4.890  Recent Lipid Panel    Component Value Date/Time   CHOL 123 11/28/2018 0000   TRIG 72 11/28/2018 0000   LDLCALC 53 11/28/2018 0000    Physical Exam:    VS:  BP (!) 160/86 (BP Location: Left Arm, Patient Position: Sitting, Cuff Size: Normal)   Pulse (!) 55   Ht 5\' 10"  (1.778 m)   Wt 170 lb 8 oz (77.3 kg)   SpO2 97%   BMI 24.46 kg/m     Wt Readings from Last 3 Encounters:  02/28/19 170 lb 8 oz (77.3 kg)  01/05/19 166 lb 2 oz (75.4 kg)  12/30/18 167 lb 8 oz (76 kg)     GEN:  Well nourished, well developed in no acute distress HEENT: Normal NECK: No JVD; No carotid bruits LYMPHATICS: No lymphadenopathy CARDIAC: Regular, bradycardic, no murmurs, rubs, gallops RESPIRATORY:  Clear to auscultation without rales, wheezing or rhonchi  ABDOMEN: Soft, non-tender, non-distended MUSCULOSKELETAL:  No edema; No deformity  SKIN: Warm and dry NEUROLOGIC:  Alert and oriented x 3 PSYCHIATRIC:  Normal affect   ASSESSMENT:   Cardiac monitor with no evidence of atrial fibrillation.  Occasional SVTs noted.  Patient has elevated blood pressure in the office.  His blood pressure at home is usually in the 120s to 130s.  Patient may have whitecoat hypertension.  His chads vas score is at least 2 (age-30, ?hypertension-1).  He is currently in sinus bradycardia with heart rate 51.  1. PAF (paroxysmal atrial fibrillation) ([place])   2. Mitral valve insufficiency, unspecified etiology   3. 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  Continue Eliquis 5 mg twice daily.  Get echocardiogram. 2.  Echocardiogram to monitor MR 3.  Echocardiogram to monitor aortic root size 4.  General check blood pressure daily and keep a log.  Patient will bring BP log during echo  appointment.  Follow-up in 6 months.  Will call patient with echo results.  If any significant changes, may consider an earlier appointment.  Total encounter time  more than 35 minutes  Greater than 50% was spent in counseling and coordination of care with the patient  This note was generated in part or whole with voice recognition software. Voice recognition is usually quite accurate but there are transcription errors that can and very often do occur. I apologize for any typographical errors that were not detected and corrected.   Medication Adjustments/Labs and Tests Ordered: Current medicines are reviewed at length with the patient today.  Concerns regarding medicines are outlined above.  Orders Placed This Encounter  Procedures  . EKG 12-Lead   No orders of the defined types were placed in this encounter.   Patient Instructions  Medication Instructions:  Your physician recommends that you continue on your current medications as directed. Please refer to the Current Medication list given to you today.  *If you need a refill on your cardiac medications before your next appointment, please call your pharmacy*  Lab Work: none If you have labs (blood work) drawn today and your tests are completely normal, you will receive your results only by: Marland Kitchen MyChart Message (if you have MyChart) OR . A paper copy in the mail If you have any lab test that is abnormal or we need to change your treatment, we will call you to review the results.  Testing/Procedures: Your physician has requested that you have an echocardiogram. Echocardiography is a painless test that uses sound waves to create images of your heart. It provides your doctor with information about the size and shape of your heart and how well your heart's chambers and valves are working. This procedure takes approximately one hour. There are no restrictions for this procedure. You may get an IV, if needed, to receive an ultrasound  enhancing agent through to better visualize your heart.   Follow-Up: At [facility], you and your health needs are our priority.  As part of our continuing mission to provide you with exceptional heart care, we have created designated Provider Care Teams.  These Care Teams include your primary Cardiologist (physician) and Advanced Practice Providers (APPs -  Physician Assistants and Nurse Practitioners) who all work together to provide you with the care you need, when you need it.  Your next appointment:   6 month(s)  The format for your next appointment:   In Person  Provider:    You may see Kate Sable, MD or one of the following Advanced Practice Providers on your designated Care Team:    Murray Hodgkins, NP  Christell Faith, PA-C  Marrianne Mood, PA-C     Signed, Kate Sable, MD  02/28/2019 2:55 PM    [place]

## 2019-02-28 NOTE — Patient Instructions (Signed)
Medication Instructions:  Your physician recommends that you continue on your current medications as directed. Please refer to the Current Medication list given to you today.  *If you need a refill on your cardiac medications before your next appointment, please call your pharmacy*  Lab Work: none If you have labs (blood work) drawn today and your tests are completely normal, you will receive your results only by: Marland Kitchen MyChart Message (if you have MyChart) OR . A paper copy in the mail If you have any lab test that is abnormal or we need to change your treatment, we will call you to review the results.  Testing/Procedures: Your physician has requested that you have an echocardiogram. Echocardiography is a painless test that uses sound waves to create images of your heart. It provides your doctor with information about the size and shape of your heart and how well your heart's chambers and valves are working. This procedure takes approximately one hour. There are no restrictions for this procedure. You may get an IV, if needed, to receive an ultrasound enhancing agent through to better visualize your heart.   Follow-Up: At [facility], you and your health needs are our priority.  As part of our continuing mission to provide you with exceptional heart care, we have created designated Provider Care Teams.  These Care Teams include your primary Cardiologist (physician) and Advanced Practice Providers (APPs -  Physician Assistants and Nurse Practitioners) who all work together to provide you with the care you need, when you need it.  Your next appointment:   6 month(s)  The format for your next appointment:   In Person  Provider:    You may see Kate Sable, MD or one of the following Advanced Practice Providers on your designated Care Team:    Murray Hodgkins, NP  Christell Faith, PA-C  Marrianne Mood, PA-C

## 2019-03-18 ENCOUNTER — Encounter: Payer: Self-pay | Admitting: Family Medicine

## 2019-03-23 ENCOUNTER — Other Ambulatory Visit: Payer: Self-pay

## 2019-03-23 ENCOUNTER — Ambulatory Visit (INDEPENDENT_AMBULATORY_CARE_PROVIDER_SITE_OTHER): Payer: Medicare Other

## 2019-03-23 ENCOUNTER — Telehealth: Payer: Self-pay | Admitting: Cardiology

## 2019-03-23 DIAGNOSIS — I340 Nonrheumatic mitral (valve) insufficiency: Secondary | ICD-10-CM

## 2019-03-23 DIAGNOSIS — I480 Paroxysmal atrial fibrillation: Secondary | ICD-10-CM

## 2019-03-23 NOTE — Telephone Encounter (Signed)
Patient dropped off BP readings to be reviewed °Placed in nurse box  °

## 2019-03-23 NOTE — Telephone Encounter (Signed)
Blood pressure readings paper placed in Dr Thereasa Solo office basket for his review.

## 2019-03-27 ENCOUNTER — Other Ambulatory Visit: Payer: Self-pay | Admitting: Family Medicine

## 2019-03-28 ENCOUNTER — Encounter: Payer: Self-pay | Admitting: Family Medicine

## 2019-03-28 NOTE — Telephone Encounter (Signed)
Electronic refill request. Levothyroxine Last office visit:   01/05/2019  I don't see a TSH recently. Last Filled:   09/26/2018 Historical Please advise.

## 2019-03-29 NOTE — Telephone Encounter (Signed)
TSH done 2020, entered from outside records.  Prescription sent.  Thanks.

## 2019-04-06 ENCOUNTER — Encounter: Payer: Self-pay | Admitting: Family Medicine

## 2019-04-14 ENCOUNTER — Encounter: Payer: Self-pay | Admitting: Family Medicine

## 2019-04-25 ENCOUNTER — Encounter: Payer: Self-pay | Admitting: Family Medicine

## 2019-04-30 ENCOUNTER — Other Ambulatory Visit: Payer: Self-pay | Admitting: Family Medicine

## 2019-04-30 DIAGNOSIS — M25519 Pain in unspecified shoulder: Secondary | ICD-10-CM

## 2019-05-16 ENCOUNTER — Encounter: Payer: Self-pay | Admitting: Family Medicine

## 2019-05-17 ENCOUNTER — Encounter: Payer: Self-pay | Admitting: Family Medicine

## 2019-05-21 ENCOUNTER — Encounter: Payer: Self-pay | Admitting: Family Medicine

## 2019-05-21 DIAGNOSIS — M549 Dorsalgia, unspecified: Secondary | ICD-10-CM | POA: Insufficient documentation

## 2019-05-29 MED ORDER — APIXABAN 5 MG PO TABS: 5.0000 mg | ORAL_TABLET | Freq: Two times a day (BID) | ORAL | 1 refills | Status: DC

## 2019-06-04 ENCOUNTER — Encounter: Payer: Self-pay | Admitting: Family Medicine

## 2019-06-11 ENCOUNTER — Other Ambulatory Visit: Payer: Self-pay | Admitting: Family Medicine

## 2019-06-11 DIAGNOSIS — E039 Hypothyroidism, unspecified: Secondary | ICD-10-CM

## 2019-07-20 ENCOUNTER — Encounter: Payer: Self-pay | Admitting: Family Medicine

## 2019-08-14 ENCOUNTER — Other Ambulatory Visit (INDEPENDENT_AMBULATORY_CARE_PROVIDER_SITE_OTHER): Payer: Medicare Other

## 2019-08-14 ENCOUNTER — Other Ambulatory Visit: Payer: Self-pay

## 2019-08-14 DIAGNOSIS — E039 Hypothyroidism, unspecified: Secondary | ICD-10-CM | POA: Diagnosis not present

## 2019-08-14 LAB — TSH: TSH: 2.46 u[IU]/mL (ref 0.35–4.50)

## 2019-08-21 ENCOUNTER — Other Ambulatory Visit: Payer: Self-pay | Admitting: Family Medicine

## 2019-10-13 ENCOUNTER — Telehealth: Payer: Self-pay | Admitting: Cardiology

## 2019-10-13 NOTE — Telephone Encounter (Signed)
   [facility] Pre-operative Risk Assessment    HEARTCARE STAFF: - Please ensure there is not already an duplicate clearance open for this procedure. - Under Visit Info/Reason for Call, type in Other and utilize the format Clearance MM/DD/YY or Clearance TBD. Do not use dashes or single digits. - If request is for dental extraction, please clarify the # of teeth to be extracted.  Request for surgical clearance:  1. What type of surgery is being performed? Right Knee arthroscopy 2.  3. When is this surgery scheduled? 01/09/20  4. What type of clearance is required (medical clearance vs. Pharmacy clearance to hold med vs. Both)? Not listed  5. Are there any medications that need to be held prior to surgery and how long? Not listed  6. Practice name and name of physician performing surgery? [facility], Dr. Mack Guise   7. What is the office phone number? [phone] x [phone]   7.   What is the office fax number? [phone]  8.   Anesthesia type (None, local, MAC, general) ? general   Norman Mitchell 10/13/2019, 3:05 PM  _________________________________________________________________   (provider comments below)

## 2019-10-13 NOTE — Telephone Encounter (Signed)
Please call pt to schedule appt for clearance per Jesse Cleaver, NP. 

## 2019-10-13 NOTE — Telephone Encounter (Signed)
Primary Cardiologist:Brian Agbor-Etang, MD  Chart reviewed as part of pre-operative protocol coverage. Because of Norman Mitchell past medical history and time since last visit, he/she will require a follow-up visit in order to better assess preoperative cardiovascular risk.  Pre-op covering staff: - Please schedule appointment and call patient to inform them. - Please contact requesting surgeon's office via preferred method (i.e, phone, fax) to inform them of need for appointment prior to surgery.  If applicable, this message will also be routed to pharmacy pool and/or primary cardiologist for input on holding anticoagulant/antiplatelet agent as requested below so that this information is available at time of patient's appointment.   Deberah Pelton, NP  10/13/2019, 3:25 PM

## 2019-10-16 NOTE — Telephone Encounter (Signed)
Pt scheduled for clearance appt with Dr. Garen Lah on Monday, 12/04/19 at 2:20 PM.

## 2019-11-08 ENCOUNTER — Ambulatory Visit (INDEPENDENT_AMBULATORY_CARE_PROVIDER_SITE_OTHER): Payer: Medicare Other

## 2019-11-08 ENCOUNTER — Other Ambulatory Visit: Payer: Self-pay

## 2019-11-08 DIAGNOSIS — Z0000 Encounter for general adult medical examination without abnormal findings: Secondary | ICD-10-CM | POA: Diagnosis not present

## 2019-11-08 NOTE — Progress Notes (Signed)
Subjective:   Norman Mitchell is a 79 y.o. male who presents for Medicare Annual/Subsequent preventive examination.  Review of Systems: N/A     I connected with the patient today by telephone and verified that I am speaking with the correct person using two identifiers. Location patient: home Location nurse: work Persons participating in the telephone visit: patient, nurse.   I discussed the limitations, risks, security and privacy concerns of performing an evaluation and management service by telephone and the availability of in person appointments. I also discussed with the patient that there may be a patient responsible charge related to this service. The patient expressed understanding and verbally consented to this telephonic visit.        Cardiac Risk Factors include: advanced age (>49men, >60 women);male gender     Objective:    Today's Vitals   There is no height or weight on file to calculate BMI.  Advanced Directives 11/08/2019  Does Patient Have a Medical Advance Directive? Yes  Type of Paramedic of [place];Living will  Copy of [place] in Chart? No - copy requested    Current Medications (verified) Outpatient Encounter Medications as of 11/08/2019  Medication Sig  . albuterol (VENTOLIN HFA) 108 (90 Base) MCG/ACT inhaler Inhale into the lungs as needed.   Marland Kitchen alfuzosin (UROXATRAL) 10 MG 24 hr tablet Take by mouth daily.   Marland Kitchen apixaban (ELIQUIS) 5 MG TABS tablet Take 1 tablet (5 mg total) by mouth 2 (two) times daily.  Marland Kitchen CRANBERRY EXTRACT PO Take 500 mg by mouth 3 (three) times a week.   . finasteride (PROSCAR) 5 MG tablet Take 5 mg by mouth daily.   Marland Kitchen levothyroxine (SYNTHROID) 75 MCG tablet TAKE ONE TABLET BY MOUTH DAILY  . Lutein 20 MG CAPS Take by mouth.  . Multiple Vitamins-Minerals (SENIOR MULTIVITAMIN PLUS PO)   . NON FORMULARY Cocoavia 750 mg daily.  . Omega-3 1000 MG CAPS Take by mouth. 3 times a week  . Fiber,  Corn Dextrin, POWD Take 2 tsp per day   No facility-administered encounter medications on file as of 11/08/2019.    Allergies (verified) Sulfamethoxazole-trimethoprim, Ciprofloxacin, Ibuprofen, and Levofloxacin   History: Past Medical History:  Diagnosis Date  . Back pain   . Hypothyroidism   . 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  . Paroxysmal atrial fibrillation ([place])   . Prostatitis   . Scoliosis   . Thrombocytopenia ([place])   . Venous insufficiency    Past Surgical History:  Procedure Laterality Date  . KNEE ARTHROSCOPY    . Oral biopsy    . PILONIDAL CYST EXCISION    . PROSTATE BIOPSY     Negative  . SEPTOPLASTY    . TONSILLECTOMY     Family History  Problem Relation Age of Onset  . Heart disease Mother   . High Cholesterol Father   . High blood pressure Father   . Stroke Father   . Prostate cancer Paternal Grandfather        Possible diagnosis, not definite.  . Colon cancer Neg Hx    Social History   Socioeconomic History  . Marital status: Married    Spouse name: Not on file  . Number of children: Not on file  . Years of education: Not on file  . Highest education level: Not on file  Occupational History  . Not on file  Tobacco Use  . Smoking status: Never Smoker  . Smokeless tobacco:  Never Used  Substance and Sexual Activity  . Alcohol use: Never  . Drug use: Never  . Sexual activity: Not on file  Other Topics Concern  . Not on file  Social History Narrative   From [place], [place].    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   [phone] through 1972.  87.  No service related issues.   Enjoys hiking, paining, Scientist, water quality, woodworking   Lives at [facility].   Social Determinants of Health   Financial Resource Strain: Low Risk   . Difficulty of Paying Living Expenses: Not hard at all  Food Insecurity: No Food Insecurity  . Worried About Charity fundraiser in the Last Year:  Never true  . Ran Out of Food in the Last Year: Never true  Transportation Needs: No Transportation Needs  . Lack of Transportation (Medical): No  . Lack of Transportation (Non-Medical): No  Physical Activity: Sufficiently Active  . Days of Exercise per Week: 7 days  . Minutes of Exercise per Session: 50 min  Stress: No Stress Concern Present  . Feeling of Stress : Not at all  Social Connections:   . Frequency of Communication with Friends and Family: Not on file  . Frequency of Social Gatherings with Friends and Family: Not on file  . Attends Religious Services: Not on file  . Active Member of Clubs or Organizations: Not on file  . Attends Archivist Meetings: Not on file  . Marital Status: Not on file    Tobacco Counseling Counseling given: Not Answered   Clinical Intake:  Pre-visit preparation completed: Yes  Pain : No/denies pain     Nutritional Risks: None Diabetes: No  How often do you need to have someone help you when you read instructions, pamphlets, or other written materials from your doctor or pharmacy?: 1 - Never What is the last grade level you completed in school?: 6 years of college  Diabetic: no Nutrition Risk Assessment:  Has the patient had any N/V/D within the last 2 months?  No  Does the patient have any non-healing wounds?  No  Has the patient had any unintentional weight loss or weight gain?  No   Diabetes:  Is the patient diabetic?  No  If diabetic, was a CBG obtained today?  N/A Did the patient bring in their glucometer from home?  N/A How often do you monitor your CBG's? N/A.   Financial Strains and Diabetes Management:  Are you having any financial strains with the device, your supplies or your medication? N/A.  Does the patient want to be seen by Chronic Care Management for management of their diabetes?  N/A Would the patient like to be referred to a Nutritionist or for Diabetic Management?  N/A     Interpreter Needed?:  No  Information entered by :: CJohnson, LPN   Activities of Daily Living In your present state of health, do you have any difficulty performing the following activities: 11/08/2019  Hearing? N  Vision? N  Difficulty concentrating or making decisions? N  Walking or climbing stairs? N  Dressing or bathing? N  Doing errands, shopping? N  Preparing Food and eating ? N  Using the Toilet? N  In the past six months, have you accidently leaked urine? N  Do you have problems with loss of bowel control? N  Managing your Medications? N  Managing your Finances? N  Housekeeping or managing  your Housekeeping? N  Some recent data might be hidden    Patient Care Team: Tonia Ghent, MD as PCP - General (Family Medicine) Kate Sable, MD as PCP - Cardiology (Cardiology)  Indicate any recent Medical Services you may have received from other than [facility] providers in the past year (date may be approximate).     Assessment:   This is a routine wellness examination for [place].  Hearing/Vision screen  Hearing Screening   125Hz  250Hz  500Hz  1000Hz  2000Hz  3000Hz  4000Hz  6000Hz  8000Hz   Right ear:           Left ear:           Vision Screening Comments: Patient gets annual eye exams   Dietary issues and exercise activities discussed: Current Exercise Habits: Home exercise routine, Type of exercise: walking, Time (Minutes): 45, Frequency (Times/Week): 7, Weekly Exercise (Minutes/Week): 315, Intensity: Moderate, Exercise limited by: None identified  Goals    . Patient Stated     11/08/2019, I will continue to walk 2 miles everyday.       Depression Screen PHQ 2/9 Scores 11/08/2019  PHQ - 2 Score 0  PHQ- 9 Score 0    Fall Risk Fall Risk  11/08/2019  Falls in the past year? 0  Number falls in past yr: 0  Injury with Fall? 0  Risk for fall due to : Medication side effect  Follow up Falls evaluation completed;Falls prevention discussed    Any stairs in or around the home? Yes  If so, are  there any without handrails? No  Home free of loose throw rugs in walkways, pet beds, electrical cords, etc? Yes  Adequate lighting in your home to reduce risk of falls? Yes   ASSISTIVE DEVICES UTILIZED TO PREVENT FALLS:  Life alert? No  Use of a cane, walker or w/c? No  Grab bars in the bathroom? No  Shower chair or bench in shower? No  Elevated toilet seat or a handicapped toilet? No   TIMED UP AND GO:  Was the test performed? N/A, telephonic visit .   Cognitive Function: MMSE - Mini Mental State Exam 11/08/2019  Orientation to time 5  Orientation to Place 5  Registration 3  Attention/ Calculation 5  Recall 3  Language- repeat 1       Mini Cog  Mini-Cog screen was completed. Maximum score is 22. A value of 0 denotes this part of the MMSE was not completed or the patient failed this part of the Mini-Cog screening.  Immunizations Immunization History  Administered Date(s) Administered  . Hepatitis A 04/04/2003, 10/23/2003  . Hepatitis B 04/17/2004, 05/19/2004, 11/27/2004  . IPV 04/24/2008  . Influenza-Unspecified 12/30/2015, 11/28/2018  . Moderna SARS-COVID-2 Vaccination 03/16/2019, 04/13/2019  . Pneumococcal Conjugate-13 10/12/2013  . Pneumococcal Polysaccharide-23 05/08/2002, 11/13/2009  . Tdap 03/27/2008, 08/08/2018  . Unspecified SARS-COV-2 Vaccination 05/01/2019  . Zoster Recombinat (Shingrix) 12/07/2017, 03/14/2018    TDAP status: Up to date Flu Vaccine status: due, will get at upcoming office visit  Pneumococcal vaccine status: Up to date Covid-19 vaccine status: Completed vaccines  Qualifies for Shingles Vaccine? Yes   Zostavax completed No   Shingrix Completed?: Yes  Screening Tests Health Maintenance  Topic Date Due  . Hepatitis C Screening  Never done  . INFLUENZA VACCINE  10/01/2019  . TETANUS/TDAP  08/07/2028  . COVID-19 Vaccine  Completed  . PNA vac Low Risk Adult  Completed    Health Maintenance  Health Maintenance Due  Topic Date Due    .  Hepatitis C Screening  Never done  . INFLUENZA VACCINE  10/01/2019    Colorectal cancer screening: no longer required per patient   Lung Cancer Screening: (Low Dose CT Chest recommended if Age 48-80 years, 30 pack-year currently smoking OR have quit w/in 15 years) does not qualify.    Additional Screening:  Hepatitis C Screening: does qualify; Completed due   Vision Screening: Recommended annual ophthalmology exams for early detection of glaucoma and other disorders of the eye. Is the patient up to date with their annual eye exam?  Yes  Who is the provider or what is the name of the office in which the patient attends annual eye exams? [facility]  If pt is not established with a provider, would they like to be referred to a provider to establish care? No .   Dental Screening: Recommended annual dental exams for proper oral hygiene  Community Resource Referral / Chronic Care Management: CRR required this visit?  No   CCM required this visit?  No      Plan:     I have personally reviewed and noted the following in the patient's chart:   . Medical and social history . Use of alcohol, tobacco or illicit drugs  . Current medications and supplements . Functional ability and status . Nutritional status . Physical activity . Advanced directives . List of other physicians . Hospitalizations, surgeries, and ER visits in previous 12 months . Vitals . Screenings to include cognitive, depression, and falls . Referrals and appointments  In addition, I have reviewed and discussed with patient certain preventive protocols, quality metrics, and best practice recommendations. A written personalized care plan for preventive services as well as general preventive health recommendations were provided to patient.   Due to this being a telephonic visit, the after visit summary with patients personalized plan was offered to patient via mail or my-chart. Patient preferred to pick  up at office at next visit.  Andrez Grime, LPN   [id number]

## 2019-11-08 NOTE — Progress Notes (Signed)
PCP notes:  Health Maintenance: Flu- due   Abnormal Screenings: none   Patient concerns: Right knee meniscus tear  Right shoulder rotator cuff tear  Discuss changing urologist to Dr. Gillermo Murdoch at [facility]  Discuss stopping Eliquis  Scaly bump on his nose  Wants PSA and thyroid levels checked   Nurse concerns: none   Next PCP appt.: 11/10/2019 @ 9:30 am

## 2019-11-08 NOTE — Patient Instructions (Signed)
Norman Mitchell , Thank you for taking time to come for your Medicare Wellness Visit. I appreciate your ongoing commitment to your health goals. Please review the following plan we discussed and let me know if I can assist you in the future.   Screening recommendations/referrals: Colonoscopy: no longer required Recommended yearly ophthalmology/optometry visit for glaucoma screening and checkup Recommended yearly dental visit for hygiene and checkup  Vaccinations: Influenza vaccine: due, will get at next office visit  Pneumococcal vaccine: Completed series Tdap vaccine: Up to date, completed 08/08/2018, due 07/2028 Shingles vaccine: Completed series   Covid-19: Completed series  Advanced directives: Please bring a copy of your POA (Power of Attorney) and/or Living Will to your next appointment.   Conditions/risks identified: none  Next appointment: Follow up in one year for your annual wellness visit.   Preventive Care 77 Years and Older, Male Preventive care refers to lifestyle choices and visits with your health care provider that can promote health and wellness. What does preventive care include?  A yearly physical exam. This is also called an annual well check.  Dental exams once or twice a year.  Routine eye exams. Ask your health care provider how often you should have your eyes checked.  Personal lifestyle choices, including:  Daily care of your teeth and gums.  Regular physical activity.  Eating a healthy diet.  Avoiding tobacco and drug use.  Limiting alcohol use.  Practicing safe sex.  Taking low doses of aspirin every day.  Taking vitamin and mineral supplements as recommended by your health care provider. What happens during an annual well check? The services and screenings done by your health care provider during your annual well check will depend on your age, overall health, lifestyle risk factors, and family history of disease. Counseling  Your health care  provider may ask you questions about your:  Alcohol use.  Tobacco use.  Drug use.  Emotional well-being.  Home and relationship well-being.  Sexual activity.  Eating habits.  History of falls.  Memory and ability to understand (cognition).  Work and work Statistician. Screening  You may have the following tests or measurements:  Height, weight, and BMI.  Blood pressure.  Lipid and cholesterol levels. These may be checked every 5 years, or more frequently if you are over 77 years old.  Skin check.  Lung cancer screening. You may have this screening every year starting at age 77 if you have a 30-pack-year history of smoking and currently smoke or have quit within the past 15 years.  Fecal occult blood test (FOBT) of the stool. You may have this test every year starting at age 77.  Flexible sigmoidoscopy or colonoscopy. You may have a sigmoidoscopy every 5 years or a colonoscopy every 10 years starting at age 77.  Prostate cancer screening. Recommendations will vary depending on your family history and other risks.  Hepatitis C blood test.  Hepatitis B blood test.  Sexually transmitted disease (STD) testing.  Diabetes screening. This is done by checking your blood sugar (glucose) after you have not eaten for a while (fasting). You may have this done every 1-3 years.  Abdominal aortic aneurysm (AAA) screening. You may need this if you are a current or former smoker.  Osteoporosis. You may be screened starting at age 77 if you are at high risk. Talk with your health care provider about your test results, treatment options, and if necessary, the need for more tests. Vaccines  Your health care provider may recommend certain  vaccines, such as:  Influenza vaccine. This is recommended every year.  Tetanus, diphtheria, and acellular pertussis (Tdap, Td) vaccine. You may need a Td booster every 77 years.  Zoster vaccine. You may need this after age 77.  Pneumococcal  13-valent conjugate (PCV13) vaccine. One dose is recommended after age 77.  Pneumococcal polysaccharide (PPSV23) vaccine. One dose is recommended after age 77. Talk to your health care provider about which screenings and vaccines you need and how often you need them. This information is not intended to replace advice given to you by your health care provider. Make sure you discuss any questions you have with your health care provider. Document Released: 03/15/2015 Document Revised: 11/06/2015 Document Reviewed: 12/18/2014 Elsevier Interactive Patient Education  2017 [place] Prevention in the Home Falls can cause injuries. They can happen to people of all ages. There are many things you can do to make your home safe and to help prevent falls. What can I do on the outside of my home?  Regularly fix the edges of walkways and driveways and fix any cracks.  Remove anything that might make you trip as you walk through a door, such as a raised step or threshold.  Trim any bushes or trees on the path to your home.  Use bright outdoor lighting.  Clear any walking paths of anything that might make someone trip, such as rocks or tools.  Regularly check to see if handrails are loose or broken. Make sure that both sides of any steps have handrails.  Any raised decks and porches should have guardrails on the edges.  Have any leaves, snow, or ice cleared regularly.  Use sand or salt on walking paths during winter.  Clean up any spills in your garage right away. This includes oil or grease spills. What can I do in the bathroom?  Use night lights.  Install grab bars by the toilet and in the tub and shower. Do not use towel bars as grab bars.  Use non-skid mats or decals in the tub or shower.  If you need to sit down in the shower, use a plastic, non-slip stool.  Keep the floor dry. Clean up any water that spills on the floor as soon as it happens.  Remove soap buildup in the  tub or shower regularly.  Attach bath mats securely with double-sided non-slip rug tape.  Do not have throw rugs and other things on the floor that can make you trip. What can I do in the bedroom?  Use night lights.  Make sure that you have a light by your bed that is easy to reach.  Do not use any sheets or blankets that are too big for your bed. They should not hang down onto the floor.  Have a firm chair that has side arms. You can use this for support while you get dressed.  Do not have throw rugs and other things on the floor that can make you trip. What can I do in the kitchen?  Clean up any spills right away.  Avoid walking on wet floors.  Keep items that you use a lot in easy-to-reach places.  If you need to reach something above you, use a strong step stool that has a grab bar.  Keep electrical cords out of the way.  Do not use floor polish or wax that makes floors slippery. If you must use wax, use non-skid floor wax.  Do not have throw rugs and other things  on the floor that can make you trip. What can I do with my stairs?  Do not leave any items on the stairs.  Make sure that there are handrails on both sides of the stairs and use them. Fix handrails that are broken or loose. Make sure that handrails are as long as the stairways.  Check any carpeting to make sure that it is firmly attached to the stairs. Fix any carpet that is loose or worn.  Avoid having throw rugs at the top or bottom of the stairs. If you do have throw rugs, attach them to the floor with carpet tape.  Make sure that you have a light switch at the top of the stairs and the bottom of the stairs. If you do not have them, ask someone to add them for you. What else can I do to help prevent falls?  Wear shoes that:  Do not have high heels.  Have rubber bottoms.  Are comfortable and fit you well.  Are closed at the toe. Do not wear sandals.  If you use a stepladder:  Make sure that it  is fully opened. Do not climb a closed stepladder.  Make sure that both sides of the stepladder are locked into place.  Ask someone to hold it for you, if possible.  Clearly mark and make sure that you can see:  Any grab bars or handrails.  First and last steps.  Where the edge of each step is.  Use tools that help you move around (mobility aids) if they are needed. These include:  Canes.  Walkers.  Scooters.  Crutches.  Turn on the lights when you go into a dark area. Replace any light bulbs as soon as they burn out.  Set up your furniture so you have a clear path. Avoid moving your furniture around.  If any of your floors are uneven, fix them.  If there are any pets around you, be aware of where they are.  Review your medicines with your doctor. Some medicines can make you feel dizzy. This can increase your chance of falling. Ask your doctor what other things that you can do to help prevent falls. This information is not intended to replace advice given to you by your health care provider. Make sure you discuss any questions you have with your health care provider. Document Released: 12/13/2008 Document Revised: 07/25/2015 Document Reviewed: 03/23/2014 Elsevier Interactive Patient Education  2017 [employer].

## 2019-11-10 ENCOUNTER — Encounter: Payer: Self-pay | Admitting: Family Medicine

## 2019-11-10 ENCOUNTER — Ambulatory Visit (INDEPENDENT_AMBULATORY_CARE_PROVIDER_SITE_OTHER): Payer: Medicare Other | Admitting: Family Medicine

## 2019-11-10 ENCOUNTER — Other Ambulatory Visit: Payer: Self-pay

## 2019-11-10 VITALS — BP 142/82 | HR 52 | Temp 97.3°F | Ht 70.0 in | Wt 169.3 lb

## 2019-11-10 DIAGNOSIS — Z125 Encounter for screening for malignant neoplasm of prostate: Secondary | ICD-10-CM | POA: Diagnosis not present

## 2019-11-10 DIAGNOSIS — L570 Actinic keratosis: Secondary | ICD-10-CM

## 2019-11-10 DIAGNOSIS — N419 Inflammatory disease of prostate, unspecified: Secondary | ICD-10-CM | POA: Diagnosis not present

## 2019-11-10 DIAGNOSIS — E039 Hypothyroidism, unspecified: Secondary | ICD-10-CM

## 2019-11-10 DIAGNOSIS — I480 Paroxysmal atrial fibrillation: Secondary | ICD-10-CM | POA: Diagnosis not present

## 2019-11-10 DIAGNOSIS — Z7189 Other specified counseling: Secondary | ICD-10-CM

## 2019-11-10 DIAGNOSIS — Z0000 Encounter for general adult medical examination without abnormal findings: Secondary | ICD-10-CM

## 2019-11-10 DIAGNOSIS — M25569 Pain in unspecified knee: Secondary | ICD-10-CM

## 2019-11-10 DIAGNOSIS — Z8249 Family history of ischemic heart disease and other diseases of the circulatory system: Secondary | ICD-10-CM | POA: Diagnosis not present

## 2019-11-10 DIAGNOSIS — Z85819 Personal history of malignant neoplasm of unspecified site of lip, oral cavity, and pharynx: Secondary | ICD-10-CM

## 2019-11-10 LAB — CBC WITH DIFFERENTIAL/PLATELET
Basophils Absolute: 0 10*3/uL (ref 0.0–0.1)
Basophils Relative: 0.9 % (ref 0.0–3.0)
Eosinophils Absolute: 0.1 10*3/uL (ref 0.0–0.7)
Eosinophils Relative: 1.5 % (ref 0.0–5.0)
HCT: 45.9 % (ref 39.0–52.0)
Hemoglobin: 15.5 g/dL (ref 13.0–17.0)
Lymphocytes Relative: 14.4 % (ref 12.0–46.0)
Lymphs Abs: 0.7 10*3/uL (ref 0.7–4.0)
MCHC: 33.9 g/dL (ref 30.0–36.0)
MCV: 90.7 fl (ref 78.0–100.0)
Monocytes Absolute: 0.5 10*3/uL (ref 0.1–1.0)
Monocytes Relative: 11.6 % (ref 3.0–12.0)
Neutro Abs: 3.4 10*3/uL (ref 1.4–7.7)
Neutrophils Relative %: 71.6 % (ref 43.0–77.0)
Platelets: 107 10*3/uL — ABNORMAL LOW (ref 150.0–400.0)
RBC: 5.06 Mil/uL (ref 4.22–5.81)
RDW: 13.5 % (ref 11.5–15.5)
WBC: 4.8 10*3/uL (ref 4.0–10.5)

## 2019-11-10 LAB — COMPREHENSIVE METABOLIC PANEL
ALT: 14 U/L (ref 0–53)
AST: 21 U/L (ref 0–37)
Albumin: 4.5 g/dL (ref 3.5–5.2)
Alkaline Phosphatase: 66 U/L (ref 39–117)
BUN: 21 mg/dL (ref 6–23)
CO2: 32 mEq/L (ref 19–32)
Calcium: 9.4 mg/dL (ref 8.4–10.5)
Chloride: 102 mEq/L (ref 96–112)
Creatinine, Ser: 1.18 mg/dL (ref 0.40–1.50)
GFR: 59.86 mL/min — ABNORMAL LOW (ref >60.00)
Glucose, Bld: 88 mg/dL (ref 70–99)
Potassium: 4.2 mEq/L (ref 3.5–5.1)
Sodium: 140 mEq/L (ref 135–145)
Total Bilirubin: 2.2 mg/dL — ABNORMAL HIGH (ref 0.2–1.2)
Total Protein: 6.8 g/dL (ref 6.0–8.3)

## 2019-11-10 LAB — LIPID PANEL
Cholesterol: 137 mg/dL (ref 0–200)
HDL: 61.6 mg/dL (ref >39.00)
LDL Cholesterol: 62 mg/dL (ref 0–99)
NonHDL: 75.17
Total CHOL/HDL Ratio: 2
Triglycerides: 66 mg/dL (ref 0.0–149.0)
VLDL: 13.2 mg/dL (ref 0.0–40.0)

## 2019-11-10 LAB — PSA, MEDICARE: PSA: 1.92 ng/ml (ref 0.10–4.00)

## 2019-11-10 LAB — TSH: TSH: 4.11 u[IU]/mL (ref 0.35–4.50)

## 2019-11-10 NOTE — Progress Notes (Signed)
This visit occurred during the SARS-CoV-2 public health emergency.  Safety protocols were in place, including screening questions prior to the visit, additional usage of staff PPE, and extensive cleaning of exam room while observing appropriate contact time as indicated for disinfecting solutions.  Wife designated if patient were incapacitated. covid 2021 shingrix prev done PNA up to date.  tdap 2020 Flu shot to be done later this month.   PSA d/w pt- see below.  Colonoscopy done at age 15.   He had HCV screening done with blood donation ~2000.   H/o prostatitis.  Doing well now.  D/w pt about PSA check.  No dysuria.  He had seen Dr. Louis Meckel.  Discuss changing urologist to Dr. Gillermo Murdoch in [place] since that would be closer to home.  He'll update me as needed.   Hypothyroidism.  No neck mass. No dysphagia.  Compliant.  Due for labs.    H/o oral cancer.  He has periodic routine f/u.   I'll defer.  He agrees.  No new lesions.  No new sx.     Right knee meniscus tear- he has ortho f/u pending.  He hopes to have R shoulder follow up after that.    Discussed potentially stopping Eliquis.  I would prefer him to see cardiology first, unless he sig contraindication to use.  We talked about risk and benefit of use vs cessation.  No bleeding.    Scaly bump on his nose- likely [place].  D/w pt at options.  His blood pressure has been controlled at home and he has been diligent about checking it.  No chest pain.  No shortness of breath.  Meds, vitals, and allergies reviewed.   ROS: Per HPI unless specifically indicated in ROS section   GEN: nad, alert and oriented HEENT: ncat, small scaly area on the right side of the nose noted, consistent with small actinic keratosis.  Treated x3 with liquid nitrogen without complication after getting verbal informed consent. NECK: supple w/o LA CV: rrr. PULM: ctab, no inc wob ABD: soft, +bs EXT: no edema SKIN: no acute rash

## 2019-11-10 NOTE — Patient Instructions (Addendum)
Go to the lab on the way out.   If you have mychart we'll likely use that to update you.    Take care.  Glad to see you. I wouldn't change your meds for now.  I would get a flu shot each fall.

## 2019-11-12 DIAGNOSIS — M25569 Pain in unspecified knee: Secondary | ICD-10-CM | POA: Insufficient documentation

## 2019-11-12 DIAGNOSIS — L570 Actinic keratosis: Secondary | ICD-10-CM | POA: Insufficient documentation

## 2019-11-12 DIAGNOSIS — Z85819 Personal history of malignant neoplasm of unspecified site of lip, oral cavity, and pharynx: Secondary | ICD-10-CM | POA: Insufficient documentation

## 2019-11-12 NOTE — Assessment & Plan Note (Addendum)
No neck mass. No dysphagia.  Compliant.  Due for labs.  Continue levothyroxine.  See notes on labs.

## 2019-11-12 NOTE — Assessment & Plan Note (Signed)
Easily frozen x3 with liquid nitrogen without complication and routine instructions given to patient.  He will update me as needed.

## 2019-11-12 NOTE — Assessment & Plan Note (Signed)
Right knee meniscus tear- he has ortho f/u pending.  He hopes to have R shoulder follow up after that.   I will defer to orthopedics.  He agrees.

## 2019-11-12 NOTE — Assessment & Plan Note (Signed)
He has periodic routine f/u.   I'll defer.  He agrees.  No new lesions.  No new sx.

## 2019-11-12 NOTE — Assessment & Plan Note (Signed)
Wife designated if patient were incapacitated.  

## 2019-11-12 NOTE — Assessment & Plan Note (Signed)
Doing well now.  D/w pt about PSA check.  No dysuria.  He had seen Dr. Louis Meckel.  Discuss changing urologist to Dr. Gillermo Murdoch in [place] since that would be closer to home.  He'll update me as needed.

## 2019-11-12 NOTE — Assessment & Plan Note (Signed)
I would prefer him to see cardiology first, unless he sig contraindication to use.  We talked about risk and benefit of use vs cessation.  No bleeding.  In the absence of a clear contraindication I think it makes sense to continue anticoagulation for now.  Discussed with patient.  He agreed.

## 2019-11-12 NOTE — Assessment & Plan Note (Signed)
Wife designated if patient were incapacitated. covid 2021 shingrix prev done PNA up to date.  tdap 2020 Flu shot to be done later this month.   PSA d/w pt- see below.  Colonoscopy done at age 79.   He had HCV screening done with blood donation ~2000.

## 2019-11-14 ENCOUNTER — Encounter: Payer: Self-pay | Admitting: Family Medicine

## 2019-11-27 ENCOUNTER — Encounter: Payer: Self-pay | Admitting: Family Medicine

## 2019-11-27 ENCOUNTER — Encounter: Payer: Self-pay | Admitting: Urology

## 2019-11-27 ENCOUNTER — Ambulatory Visit (INDEPENDENT_AMBULATORY_CARE_PROVIDER_SITE_OTHER): Payer: Medicare Other | Admitting: Urology

## 2019-11-27 ENCOUNTER — Other Ambulatory Visit: Payer: Self-pay

## 2019-11-27 VITALS — BP 149/74 | HR 54 | Ht 70.0 in | Wt 162.0 lb

## 2019-11-27 DIAGNOSIS — N411 Chronic prostatitis: Secondary | ICD-10-CM | POA: Diagnosis not present

## 2019-11-27 DIAGNOSIS — N401 Enlarged prostate with lower urinary tract symptoms: Secondary | ICD-10-CM

## 2019-11-27 DIAGNOSIS — Z125 Encounter for screening for malignant neoplasm of prostate: Secondary | ICD-10-CM | POA: Diagnosis not present

## 2019-11-27 DIAGNOSIS — N138 Other obstructive and reflux uropathy: Secondary | ICD-10-CM

## 2019-11-27 DIAGNOSIS — C4490 Unspecified malignant neoplasm of skin, unspecified: Secondary | ICD-10-CM | POA: Insufficient documentation

## 2019-11-27 LAB — BLADDER SCAN AMB NON-IMAGING

## 2019-11-27 NOTE — Progress Notes (Signed)
11/27/19 12:49 PM   Norman Mitchell 01/28/1941 [id number]  CC: BPH and urinary symptoms, PSA screening, prostate nodule  HPI: I saw Norman Mitchell in urology clinic today for evaluation of the above issues.  He is a healthy 79 year old male with a long history of mildly elevated PSA, multiple negative prostate MRIs, and a negative prostate biopsy in 2011.  He recently moved to [place] and would like to establish care here.  He also saw Dr. Louis Meckel at [facility] urology within the last month as well.  He brought with him a sheet with all his prior PSA values which have ranged from 3-4 through 2015 when he started finasteride, then had an appropriate decrease in his PSA to about 1.8, and has been stable over the last 5 years.  Most recent prostate MRI in 2016 was negative for malignancy and prostate volume was 50 g.  He is also on alfuzosin in addition to finasteride for mild to moderate urinary symptoms.  His primary urinary complaint today is nocturia 3-4 times per night.  He also reports a history of multiple episodes of prostatitis which have correlated with increase PSA values.  He does have some swelling in his legs during the day.  He denies any gross hematuria or episodes of urinary retention.   PMH: Past Medical History:  Diagnosis Date  . Back pain   . Hypothyroidism   . 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  . Paroxysmal atrial fibrillation ([place])   . Prostatitis   . Scoliosis   . Thrombocytopenia ([place])   . Venous insufficiency     Surgical History: Past Surgical History:  Procedure Laterality Date  . KNEE ARTHROSCOPY    . Oral biopsy    . PILONIDAL CYST EXCISION    . PROSTATE BIOPSY     Negative  . SEPTOPLASTY    . TONSILLECTOMY      Family History: Family History  Problem Relation Age of Onset  . Heart disease Mother   . High Cholesterol Father   . High blood pressure Father   . Stroke Father   . Prostate cancer Paternal Grandfather        Possible  diagnosis, not definite.  . Colon cancer Neg Hx     Social History:  reports that he has never smoked. He has never used smokeless tobacco. He reports that he does not drink alcohol and does not use drugs.  Physical Exam: BP (!) 149/74   Pulse (!) 54   Ht 5\' 10"  (1.778 m)   Wt 162 lb (73.5 kg)   BMI 23.24 kg/m    Constitutional:  Alert and oriented, No acute distress. Cardiovascular: No clubbing, cyanosis, or edema. Respiratory: Normal respiratory effort, no increased work of breathing. GI: Abdomen is soft, nontender, nondistended, no abdominal masses  Laboratory Data: PSA history reviewed  Pertinent Imaging: Prostate MRI unavailable to personally review, but last prostate MRI in 2016 was negative for malignancy, and prostate volume was 50 g.  Assessment & Plan:   In summary, he is a 79 year old male with a long history of very mildly elevated PSA that has since resolved on finasteride and has been stable at about 1.8 over the last 5 years which would correlate to PSA of 3.6 on finasteride, still well within the normal range for his age.  He is interested in stopping one of his prostate medications if possible, and I recommended discontinuing finasteride and monitoring his symptoms over the next 6 months.  We  reviewed treatment options including alpha blockers, finasteride, and outlet procedures like UroLift or HOLEP for BPH and urinary symptoms.  We also discussed consideration of cystoscopy in the future for worsening symptoms.  Regarding his PSA history and PSA screening, reviewed the AUA guidelines at length that do not recommend routine screening in men over age 14.  We also discussed his reassuring PSA trend at length, and multiple negative prostate MRIs.  He is amenable to discontinuing PSA screening at this time.  Finally, we reviewed behavioral strategies at length regarding his nocturia including minimizing fluids in the evening, double voiding prior to bed, compression  stockings for his lower extremity edema, and elevation of the legs in the afternoon before laying down for bed.  -Discontinue PSA screening per AUA guidelines -Discontinue finasteride, continue alfuzosin -RTC 6 months for IPSS and PVR, symptom check -May be a candidate for UroLift or other outlet procedure in the future if worsening symptoms, pursue cystoscopy prior to any outlet procedure  Norman Madrid, MD 11/27/2019  [facility] [address] [place], [place] [address] [phone]

## 2019-11-27 NOTE — Patient Instructions (Signed)
Benign Prostatic Hyperplasia  Benign prostatic hyperplasia (BPH) is an enlarged prostate gland that is caused by the normal aging process and not by cancer. The prostate is a walnut-sized gland that is involved in the production of semen. It is located in front of the rectum and below the bladder. The bladder stores urine and the urethra is the tube that carries the urine out of the body. The prostate may get bigger as a man gets older. An enlarged prostate can press on the urethra. This can make it harder to pass urine. The build-up of urine in the bladder can cause infection. Back pressure and infection may progress to bladder damage and kidney (renal) failure. What are the causes? This condition is part of a normal aging process. However, not all men develop problems from this condition. If the prostate enlarges away from the urethra, urine flow will not be blocked. If it enlarges toward the urethra and compresses it, there will be problems passing urine. What increases the risk? This condition is more likely to develop in men over the age of 50 years. What are the signs or symptoms? Symptoms of this condition include:  Getting up often during the night to urinate.  Needing to urinate frequently during the day.  Difficulty starting urine flow.  Decrease in size and strength of your urine stream.  Leaking (dribbling) after urinating.  Inability to pass urine. This needs immediate treatment.  Inability to completely empty your bladder.  Pain when you pass urine. This is more common if there is also an infection.  Urinary tract infection (UTI). How is this diagnosed? This condition is diagnosed based on your medical history, a physical exam, and your symptoms. Tests will also be done, such as:  A post-void bladder scan. This measures any amount of urine that may remain in your bladder after you finish urinating.  A digital rectal exam. In a rectal exam, your health care provider  checks your prostate by putting a lubricated, gloved finger into your rectum to feel the back of your prostate gland. This exam detects the size of your gland and any abnormal lumps or growths.  An exam of your urine (urinalysis).  A prostate specific antigen (PSA) screening. This is a blood test used to screen for prostate cancer.  An ultrasound. This test uses sound waves to electronically produce a picture of your prostate gland. Your health care provider may refer you to a specialist in kidney and prostate diseases (urologist). How is this treated? Once symptoms begin, your health care provider will monitor your condition (active surveillance or watchful waiting). Treatment for this condition will depend on the severity of your condition. Treatment may include:  Observation and yearly exams. This may be the only treatment needed if your condition and symptoms are mild.  Medicines to relieve your symptoms, including: ? Medicines to shrink the prostate. ? Medicines to relax the muscle of the prostate.  Surgery in severe cases. Surgery may include: ? Prostatectomy. In this procedure, the prostate tissue is removed completely through an open incision or with a laparoscope or robotics. ? Transurethral resection of the prostate (TURP). In this procedure, a tool is inserted through the opening at the tip of the penis (urethra). It is used to cut away tissue of the inner core of the prostate. The pieces are removed through the same opening of the penis. This removes the blockage. ? Transurethral incision (TUIP). In this procedure, small cuts are made in the prostate. This lessens   the prostate's pressure on the urethra. ? Transurethral microwave thermotherapy (TUMT). This procedure uses microwaves to create heat. The heat destroys and removes a small amount of prostate tissue. ? Transurethral needle ablation (TUNA). This procedure uses radio frequencies to destroy and remove a small amount of  prostate tissue. ? Interstitial laser coagulation ([place]). This procedure uses a laser to destroy and remove a small amount of prostate tissue. ? Transurethral electrovaporization (TUVP). This procedure uses electrodes to destroy and remove a small amount of prostate tissue. ? Prostatic urethral lift. This procedure inserts an implant to push the lobes of the prostate away from the urethra. Follow these instructions at home:  Take over-the-counter and prescription medicines only as told by your health care provider.  Monitor your symptoms for any changes. Contact your health care provider with any changes.  Avoid drinking large amounts of liquid before going to bed or out in public.  Avoid or reduce how much caffeine or alcohol you drink.  Give yourself time when you urinate.  Keep all follow-up visits as told by your health care provider. This is important. Contact a health care provider if:  You have unexplained back pain.  Your symptoms do not get better with treatment.  You develop side effects from the medicine you are taking.  Your urine becomes very dark or has a bad smell.  Your lower abdomen becomes distended and you have trouble passing your urine. Get help right away if:  You have a fever or chills.  You suddenly cannot urinate.  You feel lightheaded, or very dizzy, or you faint.  There are large amounts of blood or clots in the urine.  Your urinary problems become hard to manage.  You develop moderate to severe low back or flank pain. The flank is the side of your body between the ribs and the hip. These symptoms may represent a serious problem that is an emergency. Do not wait to see if the symptoms will go away. Get medical help right away. Call your local emergency services (911 in the [place] Do not drive yourself to the hospital. Summary  Benign prostatic hyperplasia (BPH) is an enlarged prostate that is caused by the normal aging process and not by  cancer.  An enlarged prostate can press on the urethra. This can make it hard to pass urine.  This condition is part of a normal aging process and is more likely to develop in men over the age of 53 years.  Get help right away if you suddenly cannot urinate. This information is not intended to replace advice given to you by your health care provider. Make sure you discuss any questions you have with your health care provider. Document Revised: 01/11/2018 Document Reviewed: 03/23/2016 Elsevier Patient Education  2020 [employer]. Prostate Cancer Screening  Prostate cancer screening is a test that is done to check for the presence of prostate cancer in men. The prostate gland is a walnut-sized gland that is located below the bladder and in front of the rectum in males. The function of the prostate is to add fluid to semen during ejaculation. Prostate cancer is the second most common type of cancer in men. Who should have prostate cancer screening?  Screening recommendations vary based on age and other risk factors. Screening is recommended if:  You are older than age 74. If you are age 47-69, talk with your health care provider about your need for screening and how often screening should be done. Because most prostate  cancers are slow growing and will not cause death, screening is generally reserved in this age group for men who have a 10-15-year life expectancy.  You are younger than age 39, and you have these risk factors: ? Being a black male or a male of African descent. ? Having a father, brother, or uncle who has been diagnosed with prostate cancer. The risk is higher if your family member's cancer occurred at an early age. Screening is not recommended if:  You are younger than age 79.  You are between the ages of 61 and 71 and you have no risk factors.  You are 42 years of age or older. At this age, the risks that screening can cause are greater than the benefits that it may  provide. If you are at high risk for prostate cancer, your health care provider may recommend that you have screenings more often or that you start screening at a younger age. How is screening for prostate cancer done? The recommended prostate cancer screening test is a blood test called the prostate-specific antigen (PSA) test. PSA is a protein that is made in the prostate. As you age, your prostate naturally produces more PSA. Abnormally high PSA levels may be caused by:  Prostate cancer.  An enlarged prostate that is not caused by cancer (benign prostatic hyperplasia, BPH). This condition is very common in older men.  A prostate gland infection (prostatitis). Depending on the PSA results, you may need more tests, such as:  A physical exam to check the size of your prostate gland.  Blood and imaging tests.  A procedure to remove tissue samples from your prostate gland for testing (biopsy). What are the benefits of prostate cancer screening?  Screening can help to identify cancer at an early stage, before symptoms start and when the cancer can be treated more easily.  There is a small chance that screening may lower your risk of dying from prostate cancer. The chance is small because prostate cancer is a slow-growing cancer, and most men with prostate cancer die from a different cause. What are the risks of prostate cancer screening? The main risk of prostate cancer screening is diagnosing and treating prostate cancer that would never have caused any symptoms or problems. This is called overdiagnosisand overtreatment. PSA screening cannot tell you if your PSA is high due to cancer or a different cause. A prostate biopsy is the only procedure to diagnose prostate cancer. Even the results of a biopsy may not tell you if your cancer needs to be treated. Slow-growing prostate cancer may not need any treatment other than monitoring, so diagnosing and treating it may cause unnecessary stress or  other side effects. A prostate biopsy may also cause:  Infection or fever.  A false negative. This is a result that shows that you do not have prostate cancer when you actually do have prostate cancer. Questions to ask your health care provider  When should I start prostate cancer screening?  What is my risk for prostate cancer?  How often do I need screening?  What type of screening tests do I need?  How do I get my test results?  What do my results mean?  Do I need treatment? Where to find more information  The American Cancer Society: www.cancer.org  American Urological Association: www.auanet.org Contact a health care provider if:  You have difficulty urinating.  You have pain when you urinate or ejaculate.  You have blood in your urine or semen.  You have pain in your back or in the area of your prostate. Summary  Prostate cancer is a common type of cancer in men. The prostate gland is located below the bladder and in front of the rectum. This gland adds fluid to semen during ejaculation.  Prostate cancer screening may identify cancer at an early stage, when the cancer can be treated more easily.  The prostate-specific antigen (PSA) test is the recommended screening test for prostate cancer.  Discuss the risks and benefits of prostate cancer screening with your health care provider. If you are age 37 or older, the risks that screening can cause are greater than the benefits that it may provide. This information is not intended to replace advice given to you by your health care provider. Make sure you discuss any questions you have with your health care provider. Document Revised: 09/29/2018 Document Reviewed: 09/29/2018 Elsevier Patient Education  [place].

## 2019-11-30 ENCOUNTER — Ambulatory Visit (INDEPENDENT_AMBULATORY_CARE_PROVIDER_SITE_OTHER): Payer: Medicare Other

## 2019-11-30 ENCOUNTER — Other Ambulatory Visit: Payer: Self-pay

## 2019-11-30 DIAGNOSIS — Z23 Encounter for immunization: Secondary | ICD-10-CM

## 2019-12-04 ENCOUNTER — Other Ambulatory Visit: Payer: Self-pay

## 2019-12-04 ENCOUNTER — Ambulatory Visit (INDEPENDENT_AMBULATORY_CARE_PROVIDER_SITE_OTHER): Payer: Medicare Other | Admitting: Cardiology

## 2019-12-04 ENCOUNTER — Encounter: Payer: Self-pay | Admitting: Cardiology

## 2019-12-04 VITALS — BP 156/86 | HR 57 | Ht 70.0 in | Wt 171.8 lb

## 2019-12-04 DIAGNOSIS — I480 Paroxysmal atrial fibrillation: Secondary | ICD-10-CM

## 2019-12-04 DIAGNOSIS — Z01818 Encounter for other preprocedural examination: Secondary | ICD-10-CM | POA: Diagnosis not present

## 2019-12-04 DIAGNOSIS — R030 Elevated blood-pressure reading, without diagnosis of hypertension: Secondary | ICD-10-CM | POA: Diagnosis not present

## 2019-12-04 NOTE — Patient Instructions (Signed)

## 2019-12-04 NOTE — Progress Notes (Signed)
Cardiology Office Note:    Date:  12/04/2019   ID:  Norman Mitchell, DOB [birth year], MRN [id number]  PCP:  Tonia Ghent, MD  Cardiologist:  Kate Sable, MD  Electrophysiologist:  None   Referring MD: Tonia Ghent, MD   Chief Complaint  Patient presents with  . Pre-op Exam    pt states no Sx    History of Present Illness:    Norman Mitchell is a 79 y.o. male with a hx of arthritis, DVT, paroxysmal atrial fibrillation (diagnosed May 2017) who presents for follow-up and preop cardiac risk stratification.  Previously seen due to atrial fibrillation where cardiac monitor was ordered to evaluate A. fib burden.  No evidence of atrial fibrillation was noted.  Echocardiogram was obtained to evaluate systolic function and also ascending/aortic root due to previous history of dilatation.  Patient is planning a right knee surgical procedure from a torn meniscus.  Denies chest pain, shortness of breath.  Has rare palpitations.  Denies dizziness or syncope.  He feels well, checks his blood pressure frequently at home with ranges of [id number] systolic.  Sometimes after a warm shower, blood pressure drops to the low 100s.  Prior notes Patient had palpitations for years, eventually an EKG done at his primary care provider's office in Jul 02, 2015 showed atrial fibrillation with a ventricular rate of 101.  He was later on referred to see a cardiologist, and was evaluated by Dr. Nash Dimmer in June 2017 at [facility].  At the time his CHA2DS2-VASc score was 1.  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  He was managed with anticoagulation for 3 months with resolution.  He was later diagnosed with throat cancer and his malignancy was thought to cause the  DVT.  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    Echo 03/2019 showed normal systolic and diastolic function, EF 55 to 60%, normal aortic root and ascending aorta size, mild LVH.   Past Medical History:  Diagnosis Date  . Back pain   . Hypothyroidism   . 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  . Paroxysmal atrial fibrillation ([place])   . Prostatitis   . Scoliosis   . Thrombocytopenia ([place])   . Venous insufficiency     Past Surgical History:  Procedure Laterality Date  . KNEE ARTHROSCOPY    . Oral biopsy    . PILONIDAL CYST EXCISION    . PROSTATE BIOPSY     Negative  . SEPTOPLASTY    . TONSILLECTOMY      Current Medications: Current Meds  Medication Sig  . albuterol (VENTOLIN HFA) 108 (90 Base) MCG/ACT inhaler Inhale into the lungs as needed.   Marland Kitchen alfuzosin (UROXATRAL) 10 MG 24 hr tablet Take by mouth daily.   Marland Kitchen apixaban (ELIQUIS) 5 MG TABS tablet Take 1 tablet (5 mg total) by mouth 2 (two) times daily.  Marland Kitchen CRANBERRY EXTRACT PO Take 500 mg by mouth 3 (three) times a week.   . levothyroxine (SYNTHROID) 75 MCG tablet TAKE ONE TABLET BY MOUTH DAILY  . Lutein 20 MG CAPS Take by mouth.  . Multiple Vitamins-Minerals (SENIOR MULTIVITAMIN PLUS PO)   . NON FORMULARY Cocoavia 750 mg daily.  . Omega-3 1000 MG  CAPS Take by mouth. 3 times a week     Allergies:   Bactrim [sulfamethoxazole-trimethoprim], Ciprofloxacin, Ibuprofen, and Levofloxacin   Social History   Socioeconomic History  . Marital status: Married    Spouse name: Not on file  . Number of children: Not on file  . Years of education: Not on file  . Highest education level: Not on file  Occupational History  . Not on file  Tobacco Use  . Smoking status: Never Smoker  . Smokeless tobacco: Never Used  Substance and Sexual Activity  . Alcohol use: Never  . Drug use: Never  . Sexual activity: Not on file  Other Topics Concern  . Not on file  Social History Narrative    From [place], [place].    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   [phone] through 34.  E[employer].  No service related issues.   Enjoys hiking, painting, Scientist, water quality, woodworking   Lives at [facility].   Social Determinants of Health   Financial Resource Strain: Low Risk   . Difficulty of Paying Living Expenses: Not hard at all  Food Insecurity: No Food Insecurity  . Worried About Charity fundraiser in the Last Year: Never true  . Ran Out of Food in the Last Year: Never true  Transportation Needs: No Transportation Needs  . Lack of Transportation (Medical): No  . Lack of Transportation (Non-Medical): No  Physical Activity: Sufficiently Active  . Days of Exercise per Week: 7 days  . Minutes of Exercise per Session: 50 min  Stress: No Stress Concern Present  . Feeling of Stress : Not at all  Social Connections:   . Frequency of Communication with Friends and Family: Not on file  . Frequency of Social Gatherings with Friends and Family: Not on file  . Attends Religious Services: Not on file  . Active Member of Clubs or Organizations: Not on file  . Attends Archivist Meetings: Not on file  . Marital Status: Not on file     Family History: The patient's family history includes Heart disease in his mother; High Cholesterol in his father; High blood pressure in his father; Prostate cancer in his paternal grandfather; Stroke in his father. There is no history of Colon cancer.  ROS:   Please see the history of present illness.     All other systems reviewed and are negative.  EKGs/Labs/Other Studies Reviewed:    The following studies were reviewed today:    EKG:  EKG is  ordered today.  The ekg ordered today demonstrates sinus bradycardia.  Recent Labs: 11/10/2019: ALT 14; BUN 21; Creatinine, Ser 1.18; Hemoglobin 15.5; Platelets 107.0; Potassium 4.2; Sodium 140; TSH 4.11  Recent Lipid Panel     Component Value Date/Time   CHOL 137 11/10/2019 1016   CHOL 123 11/28/2018 0000   TRIG 66.0 11/10/2019 1016   TRIG 72 11/28/2018 0000   HDL 61.60 11/10/2019 1016   CHOLHDL 2 11/10/2019 1016   VLDL 13.2 11/10/2019 1016   LDLCALC 62 11/10/2019 1016   LDLCALC 53 11/28/2018 0000    Physical Exam:    VS:  BP (!) 156/86   Pulse (!) 57   Ht 5\' 10"  (1.778 m)   Wt 171 lb 12.8 oz (77.9 kg)   BMI 24.65 kg/m     Wt Readings from Last 3 Encounters:  12/04/19 171 lb 12.8 oz (77.9 kg)  11/27/19 162  lb (73.5 kg)  11/10/19 169 lb 5 oz (76.8 kg)     GEN:  Well nourished, well developed in no acute distress HEENT: Normal NECK: No JVD; No carotid bruits LYMPHATICS: No lymphadenopathy CARDIAC: Regular, bradycardic, no murmurs, rubs, gallops RESPIRATORY:  Clear to auscultation without rales, wheezing or rhonchi  ABDOMEN: Soft, non-tender, non-distended MUSCULOSKELETAL:  No edema; No deformity  SKIN: Warm and dry NEUROLOGIC:  Alert and oriented x 3 PSYCHIATRIC:  Normal affect   ASSESSMENT:    1. PAF (paroxysmal atrial fibrillation) ([place])   2. Pre-op evaluation   3. 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  Paroxysmal atrial fibrillation, CHA2DS2-VASc score 2.  Previous echocardiogram showed normal systolic function, normal aortic root and ascending aorta size.  Continue Eliquis.  Patient currently in sinus rhythm/bradycardia heart rate 57.  Avoiding AV nodal blockers.  2.  Right knee surgery being planned for torn meniscus.  Previous echocardiogram showed normal function.  Patient denies cardiac symptoms of chest pain or shortness of breath at rest or with exertion.  Okay to perform procedure from a cardiac perspective without any additional testing.  If needed, can we hold Eliquis 48 hours prior to procedure.  Restart Eliquis as soon as possible after surgery when safe by surgical team.  3.  Patient has whitecoat syndrome, blood pressure at home usually low  100s to 130s.  Continue to monitor off BP meds.  Total encounter time more than 40 minutes  Greater than 50% was spent in counseling and coordination of care with the patient  This note was generated in part or whole with voice recognition software. Voice recognition is usually quite accurate but there are transcription errors that can and very often do occur. I apologize for any typographical errors that were not detected and corrected.   Medication Adjustments/Labs and Tests Ordered: Current medicines are reviewed at length with the patient today.  Concerns regarding medicines are outlined above.  Orders Placed This Encounter  Procedures  . EKG 12-Lead   No orders of the defined types were placed in this encounter.   Patient Instructions  Medication Instructions:  Your physician recommends that you continue on your current medications as directed. Please refer to the Current Medication list given to you today.  *If you need a refill on your cardiac medications before your next appointment, please call your pharmacy*   Lab Work: None Ordered If you have labs (blood work) drawn today and your tests are completely normal, you will receive your results only by: Marland Kitchen MyChart Message (if you have MyChart) OR . A paper copy in the mail If you have any lab test that is abnormal or we need to change your treatment, we will call you to review the results.   Testing/Procedures: None Ordered   Follow-Up: At [facility], you and your health needs are our priority.  As part of our continuing mission to provide you with exceptional heart care, we have created designated Provider Care Teams.  These Care Teams include your primary Cardiologist (physician) and Advanced Practice Providers (APPs -  Physician Assistants and Nurse Practitioners) who all work together to provide you with the care you need, when you need it.  We recommend signing up for the patient portal called "MyChart".  Sign up  information is provided on this After Visit Summary.  MyChart is used to connect with patients for Virtual Visits (Telemedicine).  Patients are able to view lab/test results, encounter  notes, upcoming appointments, etc.  Non-urgent messages can be sent to your provider as well.   To learn more about what you can do with MyChart, go to [email or web address].    Your next appointment:   6 month(s)  The format for your next appointment:   In Person  Provider:   Kate Sable, MD   Other Instructions      Signed, Kate Sable, MD  12/04/2019 5:28 PM    [place]

## 2019-12-26 ENCOUNTER — Other Ambulatory Visit: Payer: Self-pay | Admitting: Orthopedic Surgery

## 2020-01-03 ENCOUNTER — Other Ambulatory Visit: Payer: Self-pay

## 2020-01-03 ENCOUNTER — Encounter: Payer: Self-pay | Admitting: Orthopedic Surgery

## 2020-01-03 NOTE — Anesthesia Preprocedure Evaluation (Addendum)
Anesthesia Evaluation  Patient identified by MRN, date of birth, ID band Patient awake    Reviewed: Allergy & Precautions, H&P , NPO status , Patient's Chart, lab work & pertinent test results  Airway Mallampati: I  TM Distance: >3 FB Neck ROM: full    Dental no notable dental hx.    Pulmonary sleep apnea ,    Pulmonary exam normal breath sounds clear to auscultation       Cardiovascular hypertension, + dysrhythmias Atrial Fibrillation  Rhythm:regular Rate:Normal  1.  Paroxysmal atrial fibrillation, CHA2DS2-VASc score 2.  Previous echocardiogram showed normal systolic function, normal aortic root and ascending aorta size.  Continue Eliquis.  Patient currently in sinus rhythm/bradycardia heart rate 57.  Avoiding AV nodal blockers.  2.  Right knee surgery being planned for torn meniscus.  Previous echocardiogram showed normal function.  Patient denies cardiac symptoms of chest pain or shortness of breath at rest or with exertion.  Okay to perform procedure from a cardiac perspective without any additional testing.  If needed, can we hold Eliquis 48 hours prior to procedure.  Restart Eliquis as soon as possible after surgery when safe by surgical team.   Neuro/Psych    GI/Hepatic   Endo/Other  Hypothyroidism   Renal/GU      Musculoskeletal   Abdominal   Peds  Hematology   Anesthesia Other Findings   Reproductive/Obstetrics                            Anesthesia Physical Anesthesia Plan  ASA: III  Anesthesia Plan: General   Post-op Pain Management:    Induction:   PONV Risk Score and Plan: 2 and Treatment may vary due to age or medical condition, Dexamethasone and Ondansetron  Airway Management Planned:   Additional Equipment:   Intra-op Plan:   Post-operative Plan:   Informed Consent: I have reviewed the patients History and Physical, chart, labs and discussed the procedure including  the risks, benefits and alternatives for the proposed anesthesia with the patient or authorized representative who has indicated his/her understanding and acceptance.     Dental Advisory Given  Plan Discussed with: CRNA  Anesthesia Plan Comments:         Anesthesia Quick Evaluation

## 2020-01-05 ENCOUNTER — Other Ambulatory Visit
Admission: RE | Admit: 2020-01-05 | Discharge: 2020-01-05 | Disposition: A | Discharge status: Home / Self Care | Payer: Medicare Other | Source: Ambulatory Visit | Attending: Orthopedic Surgery | Admitting: Orthopedic Surgery

## 2020-01-05 ENCOUNTER — Other Ambulatory Visit: Payer: Self-pay

## 2020-01-05 DIAGNOSIS — Z01812 Encounter for preprocedural laboratory examination: Secondary | ICD-10-CM | POA: Diagnosis present

## 2020-01-05 DIAGNOSIS — Z20822 Contact with and (suspected) exposure to covid-19: Secondary | ICD-10-CM | POA: Diagnosis not present

## 2020-01-05 LAB — CBC WITH DIFFERENTIAL/PLATELET
Abs Immature Granulocytes: 0.02 10*3/uL (ref 0.00–0.07)
Basophils Absolute: 0 10*3/uL (ref 0.0–0.1)
Basophils Relative: 1 %
Eosinophils Absolute: 0.1 10*3/uL (ref 0.0–0.5)
Eosinophils Relative: 1 %
HCT: 47 % (ref 39.0–52.0)
Hemoglobin: 15.5 g/dL (ref 13.0–17.0)
Immature Granulocytes: 0 %
Lymphocytes Relative: 20 %
Lymphs Abs: 1.1 10*3/uL (ref 0.7–4.0)
MCH: 30 pg (ref 26.0–34.0)
MCHC: 33 g/dL (ref 30.0–36.0)
MCV: 91.1 fL (ref 80.0–100.0)
Monocytes Absolute: 0.6 10*3/uL (ref 0.1–1.0)
Monocytes Relative: 11 %
Neutro Abs: 3.8 10*3/uL (ref 1.7–7.7)
Neutrophils Relative %: 67 %
Platelets: 133 10*3/uL — ABNORMAL LOW (ref 150–400)
RBC: 5.16 MIL/uL (ref 4.22–5.81)
RDW: 13.1 % (ref 11.5–15.5)
WBC: 5.6 10*3/uL (ref 4.0–10.5)
nRBC: 0 % (ref 0.0–0.2)

## 2020-01-05 LAB — PROTIME-INR
INR: 1.1 (ref 0.8–1.2)
Prothrombin Time: 14.1 seconds (ref 11.4–15.2)

## 2020-01-05 LAB — BASIC METABOLIC PANEL
Anion gap: 9 (ref 5–15)
BUN: 24 mg/dL — ABNORMAL HIGH (ref 8–23)
CO2: 30 mmol/L (ref 22–32)
Calcium: 8.8 mg/dL — ABNORMAL LOW (ref 8.9–10.3)
Chloride: 104 mmol/L (ref 98–111)
Creatinine, Ser: 1.14 mg/dL (ref 0.61–1.24)
GFR, Estimated: >60 mL/min (ref >60)
Glucose, Bld: 110 mg/dL — ABNORMAL HIGH (ref 70–99)
Potassium: 3.4 mmol/L — ABNORMAL LOW (ref 3.5–5.1)
Sodium: 143 mmol/L (ref 135–145)

## 2020-01-05 LAB — SARS CORONAVIRUS 2 (TAT 6-24 HRS): SARS Coronavirus 2: NEGATIVE

## 2020-01-05 LAB — APTT: aPTT: 35 seconds (ref 24–36)

## 2020-01-08 ENCOUNTER — Other Ambulatory Visit: Payer: Self-pay

## 2020-01-08 MED ORDER — APIXABAN 5 MG PO TABS
5.0000 mg | ORAL_TABLET | Freq: Two times a day (BID) | ORAL | 1 refills | Status: DC
Start: 2020-01-08 — End: 2020-08-02

## 2020-01-08 NOTE — Telephone Encounter (Signed)
Pt's age 79, wt 73.5 kg, SCr 1.14, CrCl 56.41, last ov w/ BA 12/04/19.

## 2020-01-08 NOTE — Discharge Instructions (Signed)
General Anesthesia, Adult, Care After This sheet gives you information about how to care for yourself after your procedure. Your health care provider may also give you more specific instructions. If you have problems or questions, contact your health care provider. What can I expect after the procedure? After the procedure, the following side effects are common:  Pain or discomfort at the IV site.  Nausea.  Vomiting.  Sore throat.  Trouble concentrating.  Feeling cold or chills.  Weak or tired.  Sleepiness and fatigue.  Soreness and body aches. These side effects can affect parts of the body that were not involved in surgery. Follow these instructions at home:  For at least 24 hours after the procedure:  Have a responsible adult stay with you. It is important to have someone help care for you until you are awake and alert.  Rest as needed.  Do not: ? Participate in activities in which you could fall or become injured. ? Drive. ? Use heavy machinery. ? Drink alcohol. ? Take sleeping pills or medicines that cause drowsiness. ? Make important decisions or sign legal documents. ? Take care of children on your own. Eating and drinking  Follow any instructions from your health care provider about eating or drinking restrictions.  When you feel hungry, start by eating small amounts of foods that are soft and easy to digest (bland), such as toast. Gradually return to your regular diet.  Drink enough fluid to keep your urine pale yellow.  If you vomit, rehydrate by drinking water, juice, or clear broth. General instructions  If you have sleep apnea, surgery and certain medicines can increase your risk for breathing problems. Follow instructions from your health care provider about wearing your sleep device: ? Anytime you are sleeping, including during daytime naps. ? While taking prescription pain medicines, sleeping medicines, or medicines that make you drowsy.  Return to  your normal activities as told by your health care provider. Ask your health care provider what activities are safe for you.  Take over-the-counter and prescription medicines only as told by your health care provider.  If you smoke, do not smoke without supervision.  Keep all follow-up visits as told by your health care provider. This is important. Contact a health care provider if:  You have nausea or vomiting that does not get better with medicine.  You cannot eat or drink without vomiting.  You have pain that does not get better with medicine.  You are unable to pass urine.  You develop a skin rash.  You have a fever.  You have redness around your IV site that gets worse. Get help right away if:  You have difficulty breathing.  You have chest pain.  You have blood in your urine or stool, or you vomit blood. Summary  After the procedure, it is common to have a sore throat or nausea. It is also common to feel tired.  Have a responsible adult stay with you for the first 24 hours after general anesthesia. It is important to have someone help care for you until you are awake and alert.  When you feel hungry, start by eating small amounts of foods that are soft and easy to digest (bland), such as toast. Gradually return to your regular diet.  Drink enough fluid to keep your urine pale yellow.  Return to your normal activities as told by your health care provider. Ask your health care provider what activities are safe for you. This information is not   intended to replace advice given to you by your health care provider. Make sure you discuss any questions you have with your health care provider. Document Revised: 02/19/2017 Document Reviewed: 10/02/2016 Elsevier Patient Education  2020 Elsevier Inc.  

## 2020-01-09 ENCOUNTER — Encounter
Admission: RE | Disposition: A | Discharge status: Home / Self Care | Payer: Self-pay | Source: Home / Self Care | Attending: Orthopedic Surgery

## 2020-01-09 ENCOUNTER — Other Ambulatory Visit: Payer: Self-pay

## 2020-01-09 ENCOUNTER — Ambulatory Visit: Payer: Medicare Other | Admitting: Anesthesiology

## 2020-01-09 ENCOUNTER — Encounter: Payer: Self-pay | Admitting: Orthopedic Surgery

## 2020-01-09 ENCOUNTER — Ambulatory Visit
Admission: RE | Admit: 2020-01-09 | Discharge: 2020-01-09 | Disposition: A | Discharge status: Home / Self Care | Payer: Medicare Other | Attending: Orthopedic Surgery | Admitting: Orthopedic Surgery

## 2020-01-09 DIAGNOSIS — S83271A Complex tear of lateral meniscus, current injury, right knee, initial encounter: Secondary | ICD-10-CM | POA: Insufficient documentation

## 2020-01-09 DIAGNOSIS — Z85819 Personal history of malignant neoplasm of unspecified site of lip, oral cavity, and pharynx: Secondary | ICD-10-CM | POA: Insufficient documentation

## 2020-01-09 DIAGNOSIS — Z79899 Other long term (current) drug therapy: Secondary | ICD-10-CM | POA: Insufficient documentation

## 2020-01-09 DIAGNOSIS — Z86718 Personal history of other venous thrombosis and embolism: Secondary | ICD-10-CM | POA: Insufficient documentation

## 2020-01-09 DIAGNOSIS — S83511A Sprain of anterior cruciate ligament of right knee, initial encounter: Secondary | ICD-10-CM | POA: Insufficient documentation

## 2020-01-09 DIAGNOSIS — X58XXXA Exposure to other specified factors, initial encounter: Secondary | ICD-10-CM | POA: Diagnosis not present

## 2020-01-09 DIAGNOSIS — M2341 Loose body in knee, right knee: Secondary | ICD-10-CM | POA: Insufficient documentation

## 2020-01-09 DIAGNOSIS — S83231A Complex tear of medial meniscus, current injury, right knee, initial encounter: Secondary | ICD-10-CM | POA: Insufficient documentation

## 2020-01-09 DIAGNOSIS — Z7901 Long term (current) use of anticoagulants: Secondary | ICD-10-CM | POA: Diagnosis not present

## 2020-01-09 DIAGNOSIS — Z79890 Hormone replacement therapy (postmenopausal): Secondary | ICD-10-CM | POA: Insufficient documentation

## 2020-01-09 DIAGNOSIS — Z923 Personal history of irradiation: Secondary | ICD-10-CM | POA: Insufficient documentation

## 2020-01-09 HISTORY — DX: Other complications of anesthesia, initial encounter: T88.59XA

## 2020-01-09 HISTORY — DX: Unspecified osteoarthritis, unspecified site: M19.90

## 2020-01-09 HISTORY — PX: KNEE ARTHROSCOPY WITH MEDIAL MENISECTOMY: SHX5651

## 2020-01-09 SURGERY — ARTHROSCOPY, KNEE, WITH MEDIAL MENISCECTOMY
Anesthesia: General | Site: Knee | Laterality: Right

## 2020-01-09 MED ORDER — ONDANSETRON HCL 4 MG/2ML IJ SOLN: 4.0000 mg | Freq: Once | INTRAMUSCULAR | Status: DC | PRN

## 2020-01-09 MED ORDER — OXYCODONE HCL 5 MG PO TABS
5.0000 mg | ORAL_TABLET | Freq: Once | ORAL | Status: AC | PRN
  Administered 2020-01-09: 5 mg via ORAL

## 2020-01-09 MED ORDER — ONDANSETRON HCL 4 MG/2ML IJ SOLN
INTRAMUSCULAR | Status: DC | PRN
  Administered 2020-01-09: 4 mg via INTRAVENOUS

## 2020-01-09 MED ORDER — ACETAMINOPHEN 325 MG PO TABS: 325.0000 mg | ORAL_TABLET | ORAL | Status: DC | PRN

## 2020-01-09 MED ORDER — FENTANYL CITRATE (PF) 100 MCG/2ML IJ SOLN: 25.0000 ug | INTRAMUSCULAR | Status: DC | PRN

## 2020-01-09 MED ORDER — CHLORHEXIDINE GLUCONATE CLOTH 2 % EX PADS: 6.0000 | MEDICATED_PAD | Freq: Once | CUTANEOUS | Status: DC

## 2020-01-09 MED ORDER — DEXAMETHASONE SODIUM PHOSPHATE 4 MG/ML IJ SOLN
INTRAMUSCULAR | Status: DC | PRN
  Administered 2020-01-09: 4 mg via INTRAVENOUS

## 2020-01-09 MED ORDER — LIDOCAINE HCL (CARDIAC) PF 100 MG/5ML IV SOSY
PREFILLED_SYRINGE | INTRAVENOUS | Status: DC | PRN
  Administered 2020-01-09: 50 mg via INTRATRACHEAL

## 2020-01-09 MED ORDER — SODIUM CHLORIDE 0.9 % IR SOLN
Status: DC | PRN
  Administered 2020-01-09 (×4): 3001 mL

## 2020-01-09 MED ORDER — LIDOCAINE HCL (PF) 1 % IJ SOLN
INTRAMUSCULAR | Status: DC | PRN
  Administered 2020-01-09: 5 mL

## 2020-01-09 MED ORDER — ACETAMINOPHEN 500 MG PO TABS
1000.0000 mg | ORAL_TABLET | ORAL | Status: AC
  Administered 2020-01-09: 1000 mg via ORAL

## 2020-01-09 MED ORDER — GLYCOPYRROLATE 0.2 MG/ML IJ SOLN
INTRAMUSCULAR | Status: DC | PRN
  Administered 2020-01-09 (×2): .1 mg via INTRAVENOUS

## 2020-01-09 MED ORDER — LACTATED RINGERS IR SOLN
Status: DC | PRN
  Administered 2020-01-09 (×2): 3000 mL

## 2020-01-09 MED ORDER — FENTANYL CITRATE (PF) 100 MCG/2ML IJ SOLN
INTRAMUSCULAR | Status: DC | PRN
  Administered 2020-01-09: 12.5 ug via INTRAVENOUS
  Administered 2020-01-09: 25 ug via INTRAVENOUS

## 2020-01-09 MED ORDER — ACETAMINOPHEN 160 MG/5ML PO SOLN: 325.0000 mg | ORAL | Status: DC | PRN

## 2020-01-09 MED ORDER — OXYCODONE HCL 5 MG/5ML PO SOLN: 5.0000 mg | Freq: Once | ORAL | Status: AC | PRN

## 2020-01-09 MED ORDER — EPHEDRINE SULFATE 50 MG/ML IJ SOLN
INTRAMUSCULAR | Status: DC | PRN
  Administered 2020-01-09 (×3): 5 mg via INTRAVENOUS
  Administered 2020-01-09: 10 mg via INTRAVENOUS
  Administered 2020-01-09 (×2): 5 mg via INTRAVENOUS

## 2020-01-09 MED ORDER — LACTATED RINGERS IV SOLN: INTRAVENOUS | Status: DC

## 2020-01-09 MED ORDER — ONDANSETRON HCL 4 MG PO TABS: 4.0000 mg | ORAL_TABLET | Freq: Three times a day (TID) | ORAL | 0 refills | Status: DC | PRN

## 2020-01-09 MED ORDER — PROPOFOL 10 MG/ML IV BOLUS
INTRAVENOUS | Status: DC | PRN
  Administered 2020-01-09: 120 mg via INTRAVENOUS

## 2020-01-09 MED ORDER — BUPIVACAINE HCL 0.25 % IJ SOLN
INTRAMUSCULAR | Status: DC | PRN
  Administered 2020-01-09: 20 mL

## 2020-01-09 MED ORDER — CEFAZOLIN SODIUM-DEXTROSE 2-4 GM/100ML-% IV SOLN
2.0000 g | INTRAVENOUS | Status: AC
  Administered 2020-01-09: 2 g via INTRAVENOUS

## 2020-01-09 MED ORDER — HYDROCODONE-ACETAMINOPHEN 5-325 MG PO TABS: 1.0000 | ORAL_TABLET | ORAL | 0 refills | Status: DC | PRN

## 2020-01-09 MED ORDER — MIDAZOLAM HCL 5 MG/5ML IJ SOLN
INTRAMUSCULAR | Status: DC | PRN
  Administered 2020-01-09: 2 mg via INTRAVENOUS

## 2020-01-09 SURGICAL SUPPLY — 48 items
ADAPTER IRRIG TUBE 2 SPIKE SOL (ADAPTER) ×6 IMPLANT
ADPR TBG 2 SPK PMP STRL ASCP (ADAPTER) ×2
BNDG CMPR STD VLCR NS LF 5.8X6 (GAUZE/BANDAGES/DRESSINGS) ×1
BNDG COHESIVE 4X5 TAN STRL (GAUZE/BANDAGES/DRESSINGS) ×3 IMPLANT
BNDG ELASTIC 6X5.8 VLCR NS LF (GAUZE/BANDAGES/DRESSINGS) ×3 IMPLANT
BUR RADIUS 3.5 (BURR) ×3 IMPLANT
BUR RADIUS 4.0X18.5 (BURR) ×3 IMPLANT
BUR RADIUS 5.5 (BURR) ×3 IMPLANT
CLOSURE WOUND 1/2 X4 (GAUZE/BANDAGES/DRESSINGS) ×1
COOLER POLAR GLACIER W/PUMP (MISCELLANEOUS) ×3 IMPLANT
COVER WAND RF STERILE (DRAPES) ×3 IMPLANT
CUFF TOURN SGL QUICK 30 (TOURNIQUET CUFF)
CUFF TOURN SGL QUICK 34 (TOURNIQUET CUFF) ×3
CUFF TRNQT CYL 30X4X21-28X (TOURNIQUET CUFF) IMPLANT
CUFF TRNQT CYL 34X4.125X (TOURNIQUET CUFF) ×1 IMPLANT
DRAPE IMP U-DRAPE 54X76 (DRAPES) ×3 IMPLANT
DURAPREP 26ML APPLICATOR (WOUND CARE) ×9 IMPLANT
ELECT REM PT RETURN 9FT ADLT (ELECTROSURGICAL) ×3
ELECTRODE REM PT RTRN 9FT ADLT (ELECTROSURGICAL) ×1 IMPLANT
GAUZE SPONGE 4X4 12PLY STRL (GAUZE/BANDAGES/DRESSINGS) ×3 IMPLANT
GAUZE XEROFORM 1X8 LF (GAUZE/BANDAGES/DRESSINGS) ×3 IMPLANT
GLOVE BIOGEL PI IND STRL 9 (GLOVE) ×1 IMPLANT
GLOVE BIOGEL PI INDICATOR 9 (GLOVE) ×2
GLOVE SURG 9.0 ORTHO LTXF (GLOVE) ×6 IMPLANT
GOWN STRL REUS W/ TWL LRG LVL3 (GOWN DISPOSABLE) ×1 IMPLANT
GOWN STRL REUS W/TWL 2XL LVL3 (GOWN DISPOSABLE) ×3 IMPLANT
GOWN STRL REUS W/TWL LRG LVL3 (GOWN DISPOSABLE) ×3
IV LACTATED RINGER IRRG 3000ML (IV SOLUTION) ×18
IV LR IRRIG 3000ML ARTHROMATIC (IV SOLUTION) ×6 IMPLANT
KIT TURNOVER KIT A (KITS) ×3 IMPLANT
MANIFOLD NEPTUNE II (INSTRUMENTS) ×3 IMPLANT
MAT ABSORB  FLUID 56X50 GRAY (MISCELLANEOUS) ×2
MAT ABSORB FLUID 56X50 GRAY (MISCELLANEOUS) ×1 IMPLANT
NEEDLE HYPO 22GX1.5 SAFETY (NEEDLE) ×3 IMPLANT
PACK ARTHROSCOPY KNEE (MISCELLANEOUS) ×3 IMPLANT
PAD ABD DERMACEA PRESS 5X9 (GAUZE/BANDAGES/DRESSINGS) ×6 IMPLANT
PAD WRAPON POLAR KNEE (MISCELLANEOUS) ×1 IMPLANT
SET TUBE SUCT SHAVER OUTFL 24K (TUBING) ×3 IMPLANT
SET TUBE TIP INTRA-ARTICULAR (MISCELLANEOUS) ×3 IMPLANT
SOL PREP PVP 2OZ (MISCELLANEOUS) ×3
SOLUTION PREP PVP 2OZ (MISCELLANEOUS) ×1 IMPLANT
STRIP CLOSURE SKIN 1/2X4 (GAUZE/BANDAGES/DRESSINGS) ×2 IMPLANT
SUT ETHILON 4-0 (SUTURE) ×3
SUT ETHILON 4-0 FS2 18XMFL BLK (SUTURE) ×1
SUTURE ETHLN 4-0 FS2 18XMF BLK (SUTURE) ×1 IMPLANT
TUBING ARTHRO INFLOW-ONLY STRL (TUBING) ×3 IMPLANT
WAND HAND CNTRL MULTIVAC 90 (MISCELLANEOUS) ×3 IMPLANT
WRAPON POLAR PAD KNEE (MISCELLANEOUS) ×3

## 2020-01-09 NOTE — Anesthesia Procedure Notes (Signed)
Procedure Name: LMA Insertion Date/Time: 01/09/2020 1:30 PM Performed by: Mayme Genta, CRNA Pre-anesthesia Checklist: Patient identified, Emergency Drugs available, Suction available, Timeout performed and Patient being monitored Patient Re-evaluated:Patient Re-evaluated prior to induction Oxygen Delivery Method: Circle system utilized Preoxygenation: Pre-oxygenation with 100% oxygen Induction Type: IV induction LMA: LMA inserted LMA Size: 4.0 Number of attempts: 1 Placement Confirmation: positive ETCO2 and breath sounds checked- equal and bilateral Tube secured with: Tape

## 2020-01-09 NOTE — Op Note (Signed)
PATIENT:  Norman Mitchell  PRE-OPERATIVE DIAGNOSIS:  TEAR OF MEDIAL MENISCUS, RIGHT KNEE  POST-OPERATIVE DIAGNOSIS: Right knee complex tear of the medial and lateral menisci, chondral loose bodies x2, grade 3/4 chondral wear of the lateral femoral condyle, grade 2 chondral wear of the undersurface of the patella, grade 1 chondral wear of the medial femoral condyle and trochlea, chronic sprain of the ACL  PROCEDURE:  RIGHT KNEE ARTHROSCOPIC Partial MEDIAL AND LAT MENISECTOMY, synovectomy  SURGEON:  Thornton Park, MD  ANESTHESIA:   General  PREOPERATIVE INDICATIONS:  Norman Mitchell  79 y.o. male with a diagnosis of TEAR OF MEDIAL MENISCUS who failed conservative management and elected for surgical management.    The risks benefits and alternatives were discussed with the patient preoperatively including the risks of infection, bleeding, nerve injury, knee stiffness, persistent pain, osteoarthritis and the need for further surgery. Medical  risks include DVT and pulmonary embolism, myocardial infarction, stroke, pneumonia, respiratory failure and death. The patient understood these risks and wished to proceed.  OPERATIVE FINDINGS: Complex tear of the medial meniscus with displacement into the medial gutter, complex tear of the lateral meniscus body extending into the anterior horn, compartmental osteoarthritis with grade 3/4 chondral wear of the lateral femoral condyle.  Chondral loose body in the lateral gutter and medial compartment.  Patient had grade 1 chondral changes of the medial femoral condyle and grade 2 malacia of the undersurface of the patella.  Patient had a partial sprain to the ACL from its tibial insertion which appeared chronic and did not need to clinical instability..  OPERATIVE PROCEDURE: Patient was met in the preoperative area. The operative extremity was signed with the word yes and my initials according the hospital's correct site of surgery protocol.  The patient was  brought to the operating room where they was placed supine on the operative table. General anesthesia was administered. The patient was prepped and draped in a sterile fashion.  A timeout was performed to verify the patient's name, date of birth, medical record number, correct site of surgery correct procedure to be performed. It was also used to verify the patient received antibiotics that all appropriate instruments, and radiographic studies were available in the room. Once all in attendance were in agreement, the case began.  Examination under anesthesia revealed knee range of motion from full extension to 120s of flexion.  There is no anterior laxity on Lachman's test.  He had a negative pivot shift and no instability with varus or valgus stress testing at 0 and 30 degrees of flexion.  Proposed arthroscopy incisions were drawn out with a surgical marker. These were pre-injected with 1% lidocaine plain. An 11 blade was used to establish an inferior lateral and inferomedial portals. The inferomedial portal was created using a 18-gauge spinal needle under direct visualization.  A full diagnostic examination of the knee was performed including the suprapatellar pouch, patellofemoral joint, medial lateral compartments as well as the medial lateral gutters, the intercondylar notch in the posterior knee.  Patient had the meniscal tear treated with a 4-0 resector shaver blade and straight duckbill basket.  The portion of the medial meniscus displaced into the medial gutter was reduced using a hook probe and debrided using a straight basket and 4.0 resector shaver blade.  The meniscus was debrided until a stable rim was achieved.  The knee was then placed in a figure-of-four position and a complex tear of the lateral meniscus extending from the body to the anterior horn was debrided using  a 4.0 resector shaver blade.  A chondroplasty of the lateral femoral condyle,  and undersurface of patella was also  performed using a 4-0 resector shaver blade.  2 chondral loose bodies were removed with 4 oh resector shaver blade, 1 to the medial compartment and one from the lateral gutter.  A partial synovectomy was also performed using a 4-0 resector shaver blade and 90 ArthroCare wand.  The knee was then copiously lavaged. All arthroscopic instruments were removed. The 2 arthroscopy portals were closed with 4-0 nylon. Steri-Strips were applied along with a dry sterile and compressive dressing. The patient was brought to the PACU in stable condition. I was scrubbed and present for the entire case and all sharp, sponge and instrument counts were correct at the conclusion the case. I spoke with the patient's wife postoperatively by phone from the PACU to let her know the case was performed without complication and the patient was stable in the recovery room.    Norman Gaul, MD

## 2020-01-09 NOTE — Anesthesia Postprocedure Evaluation (Signed)
Anesthesia Post Note  Patient: Norman Mitchell  Procedure(s) Performed: RIGHT KNEE ARTHROSCOPY WITH MEDIAL MENISECTOMY (Right Knee)     Patient location during evaluation: PACU Anesthesia Type: General Level of consciousness: awake and alert and oriented Pain management: satisfactory to patient Vital Signs Assessment: post-procedure vital signs reviewed and stable Respiratory status: spontaneous breathing, nonlabored ventilation and respiratory function stable Cardiovascular status: blood pressure returned to baseline and stable Postop Assessment: Adequate PO intake and No signs of nausea or vomiting Anesthetic complications: no   No complications documented.  Raliegh Ip

## 2020-01-09 NOTE — Transfer of Care (Signed)
Immediate Anesthesia Transfer of Care Note  Patient: Norman Mitchell  Procedure(s) Performed: RIGHT KNEE ARTHROSCOPY WITH MEDIAL MENISECTOMY (Right Knee)  Patient Location: PACU  Anesthesia Type: General  Level of Consciousness: awake, alert  and patient cooperative  Airway and Oxygen Therapy: Patient Spontanous Breathing and Patient connected to supplemental oxygen  Post-op Assessment: Post-op Vital signs reviewed, Patient's Cardiovascular Status Stable, Respiratory Function Stable, Patent Airway and No signs of Nausea or vomiting  Post-op Vital Signs: Reviewed and stable  Complications: No complications documented.

## 2020-01-09 NOTE — H&P (Signed)
PREOPERATIVE H&P  Chief Complaint: Tear of the Medial Meniscus of Right Knee  HPI: Norman Mitchell is a 79 y.o. male who presents for preoperative history and physical with a diagnosis of Tear of the Medial Meniscus of Right Knee. Symptoms of pain and mechanical symptoms are significantly impairing activities of daily living.  MRI is confirmed a complex tear of the medial meniscus with displacement  He wished to proceed with surgical management.   Past Medical History:  Diagnosis Date  . Arthritis    Hands, knee  . Back pain   . Complication of anesthesia    Was told after 1 surgery that his HR went "really low".  Says his normal HR is 50-55.  Marland Kitchen DVT (deep venous thrombosis) ([place]) 2017   Bilateral knees.  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  . Hypothyroidism   . 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  . Paroxysmal atrial fibrillation ([place])   . Prostatitis   . Scoliosis   . Thrombocytopenia ([place])   . Venous insufficiency    Past Surgical History:  Procedure Laterality Date  . KNEE ARTHROSCOPY    . Oral biopsy    . PILONIDAL CYST EXCISION    . PROSTATE BIOPSY     Negative  . SEPTOPLASTY    . TONSILLECTOMY     Social History   Socioeconomic History  . Marital status: Married    Spouse name: Not on file  . Number of children: Not on file  . Years of education: Not on file  . Highest education level: Not on file  Occupational History  . Not on file  Tobacco Use  . Smoking status: Never Smoker  . Smokeless tobacco: Never Used  Vaping Use  . Vaping Use: Never used  Substance and Sexual Activity  . Alcohol use: Never  . Drug use: Never  . Sexual activity: Not on file  Other Topics Concern  . Not on file  Social History Narrative   From [place], [place].    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   [employer] [phone] through 39.  E5.  No service related issues.   Enjoys hiking, painting, Scientist, water quality, woodworking    Lives at [facility].   Social Determinants of Health   Financial Resource Strain: Low Risk   . Difficulty of Paying Living Expenses: Not hard at all  Food Insecurity: No Food Insecurity  . Worried About Charity fundraiser in the Last Year: Never true  . Ran Out of Food in the Last Year: Never true  Transportation Needs: No Transportation Needs  . Lack of Transportation (Medical): No  . Lack of Transportation (Non-Medical): No  Physical Activity: Sufficiently Active  . Days of Exercise per Week: 7 days  . Minutes of Exercise per Session: 50 min  Stress: No Stress Concern Present  . Feeling of Stress : Not at all  Social Connections:   . Frequency of Communication with Friends and Family: Not on file  . Frequency of Social Gatherings with Friends and Family: Not on file  . Attends Religious Services: Not on file  . Active Member of Clubs or Organizations: Not on file  . Attends Archivist Meetings: Not on file  . Marital Status: Not on file   Family History  Problem Relation Age of Onset  . Heart disease Mother   . High Cholesterol Father   . High blood pressure Father   .  Stroke Father   . Prostate cancer Paternal Grandfather        Possible diagnosis, not definite.  . Colon cancer Neg Hx    Allergies  Allergen Reactions  . Bactrim [Sulfamethoxazole-Trimethoprim] Hives  . Ciprofloxacin Other (See Comments)    Leg tendonitis   . Ibuprofen Other (See Comments)    Renal failure with high doses.   . Levofloxacin Other (See Comments)    Severe leg tendonitis   Prior to Admission medications   Medication Sig Start Date End Date Taking? Authorizing Provider  albuterol (VENTOLIN HFA) 108 (90 Base) MCG/ACT inhaler Inhale into the lungs as needed.    Yes [provider]  alfuzosin (UROXATRAL) 10 MG 24 hr tablet Take by mouth daily.  05/14/14  Yes [provider]  CRANBERRY EXTRACT PO Take 500 mg by mouth 3 (three) times a week.    Yes [provider]  levothyroxine (SYNTHROID) 75 MCG tablet TAKE ONE TABLET BY MOUTH DAILY 08/21/19  Yes Tonia Ghent, MD  Lutein 20 MG CAPS Take by mouth. 08/01/15  Yes [provider]  Multiple Vitamins-Minerals (SENIOR MULTIVITAMIN PLUS PO)    Yes [provider]  NON FORMULARY Cocoavia 750 mg daily.   Yes [provider]  Omega-3 1000 MG CAPS Take by mouth. 3 times a week   Yes [provider]  apixaban (ELIQUIS) 5 MG TABS tablet Take 1 tablet (5 mg total) by mouth 2 (two) times daily. 01/08/20   Kate Sable, MD     Positive ROS: All other systems have been reviewed and were otherwise negative with the exception of those mentioned in the HPI and as above.  Physical Exam: General: Alert, no acute distress   MUSCULOSKELETAL: Right knee: Patient skin is intact.  There is no erythema ecchymosis or effusion.  Range of motion is 0 to 120 degrees of flexion.  Patient has tenderness of the medial joint line positive McMurray's test.  There is no ligamentous laxity.  Distally is neurovascular intact.  Assessment: Tear of the Medial Meniscus of Right Knee  Plan: Plan for Procedure(s): RIGHT KNEE ARTHROSCOPY WITH MEDIAL MENISECTOMY  I reviewed with the patient the details of the operation as well as the postoperative course.  I discussed the risks and benefits of surgery. The risks include but are not limited to infection, bleeding, nerve or blood vessel injury, joint stiffness or loss of motion, persistent pain, weakness or instability, retear of the medial meniscus and the need for further surgery. Medical risks include but are not limited to DVT and pulmonary embolism, myocardial infarction, stroke, pneumonia, respiratory failure and death. Patient understood these risks and wished to proceed.     Thornton Park, MD   01/09/2020 1:15 PM

## 2020-01-10 ENCOUNTER — Encounter: Payer: Self-pay | Admitting: Orthopedic Surgery

## 2020-02-13 ENCOUNTER — Encounter: Payer: Self-pay | Admitting: Family Medicine

## 2020-02-22 ENCOUNTER — Encounter: Payer: Self-pay | Admitting: Family Medicine

## 2020-04-29 DIAGNOSIS — E039 Hypothyroidism, unspecified: Secondary | ICD-10-CM | POA: Insufficient documentation

## 2020-05-29 ENCOUNTER — Ambulatory Visit (INDEPENDENT_AMBULATORY_CARE_PROVIDER_SITE_OTHER): Payer: Medicare Other | Admitting: Urology

## 2020-05-29 ENCOUNTER — Telehealth: Payer: Self-pay

## 2020-05-29 ENCOUNTER — Encounter: Payer: Self-pay | Admitting: Urology

## 2020-05-29 ENCOUNTER — Other Ambulatory Visit: Payer: Self-pay

## 2020-05-29 VITALS — BP 175/91 | HR 52 | Ht 70.0 in | Wt 164.0 lb

## 2020-05-29 DIAGNOSIS — N401 Enlarged prostate with lower urinary tract symptoms: Secondary | ICD-10-CM | POA: Diagnosis not present

## 2020-05-29 DIAGNOSIS — N138 Other obstructive and reflux uropathy: Secondary | ICD-10-CM

## 2020-05-29 DIAGNOSIS — Z125 Encounter for screening for malignant neoplasm of prostate: Secondary | ICD-10-CM | POA: Diagnosis not present

## 2020-05-29 LAB — BLADDER SCAN AMB NON-IMAGING

## 2020-05-29 MED ORDER — ALFUZOSIN HCL ER 10 MG PO TB24: 10.0000 mg | ORAL_TABLET | Freq: Every day | ORAL | 3 refills | Status: DC

## 2020-05-29 NOTE — Telephone Encounter (Signed)
Called pt informed him of the information below. Pt gave verbal understanding. He states that he has shoulder surgery coming up soon and so he will take more time to consider an outlet procedure in the future. He will call us back when he has made a decision. Pt expressed verbal gratitude for giving him the measurements and information from 2016.

## 2020-05-29 NOTE — Patient Instructions (Signed)
Prostatic Urethral Lift  Prostatic urethral lift is a surgical procedure to relieve symptoms of prostate gland enlargement that occurs with age (benign prostatic hypertrophy, BPH). The part of the body that drains urine from the bladder (urethra) passes between the two lobes of the prostate. As the prostate enlarges, it can push on the urethra and cause problems with urinating. This procedure involves placing an implant that holds the prostate away from the urethra. The procedure is performed with a thin operating telescope (cystoscope) that is inserted through the tip of the penis and moved up the urethra to the prostate. This is less invasive than other procedures that require an incision. You may have this procedure if:  You have symptoms of BPH.  Your prostate is not severely enlarged.  Medicines to treat BPH are not working or not tolerated.  You want to avoid possible sexual side effects from medicines or other procedures that are used to treat BPH. Tell a health care provider about:  Any allergies you have.  All medicines you are taking, including vitamins, herbs, eye drops, creams, and over-the-counter medicines.  Previous problems you or members of your family have had with the use of anesthetics.  Any blood disorders you have.  Previous surgeries you have had.  Any medical conditions you have. What are the risks?  Bleeding.  Infection.  Leaking of urine (incontinence).  Allergic reactions to medicines.  Return of BPH symptoms after 2 years, requiring more treatment. What happens before the procedure?  Ask your health care provider about: ? Changing or stopping your regular medicines. This is especially important if you are taking diabetes medicines or blood thinners. ? Taking medicines such as aspirin and ibuprofen. These medicines can thin your blood. Do not take these medicines before your procedure if your health care provider instructs you not to.  Follow  instructions from your health care provider about eating or drinking restrictions.  Plan to have someone take you home from the hospital or clinic. What happens during the procedure?  To lower your risk of infection: ? Your health care team will wash or sanitize their hands. ? Your skin will be washed with soap.  An IV may be inserted into one of your veins.  You will be given one or more of the following: ? A medicine to help you relax (sedative). ? A medicine that is injected into your urethra to numb the area (local anesthetic). ? A medicine to make you fall asleep (general anesthetic).  A cystoscope will be inserted into your penis and moved through your urethra to your prostate.  A device will be inserted through the cystoscope and used to press the lobes of your prostate away from your urethra.  Implants will be inserted through the device to hold the lobes of your prostate in the widened position.  The device and cystoscope will be removed. The procedure may vary among health care providers and hospitals. What happens after the procedure?  Your blood pressure, heart rate, breathing rate, and blood oxygen level will be monitored until the medicines you were given have worn off.  Do not drive for 24 hours if you were given a sedative. Summary  Prostatic urethral lift is a surgical procedure to relieve symptoms of prostate gland enlargement that occurs with age (benign prostatic hypertrophy, BPH).  The procedure is performed with a thin operating telescope (cystoscope) that is inserted through the tip of the penis and moved up the part of the body that drains  urine from the bladder (urethra) to reach the prostate. This is less invasive than other procedures that require an incision.  Plan to have someone take you home from the hospital or clinic. You will not be allowed to drive for 24 hours if you were given a sedative during the procedure. This information is not intended to  replace advice given to you by your health care provider. Make sure you discuss any questions you have with your health care provider. Document Revised: 10/26/2019 Document Reviewed: 10/26/2019 Elsevier Patient Education  [place].

## 2020-05-29 NOTE — Progress Notes (Signed)
   05/29/2020 12:51 PM   Adella Hare 1940-05-16 [id number]  Reason for visit: Follow up BPH, PSA screening  HPI: I saw Mr. Ging back in clinic for the above issues.  He is a healthy 80 year old male who I originally met in September 2021 for BPH, PSA screening, and a chronic prostate nodule.  He also has previously been followed by Dr. Louis Meckel at [facility] as well.  His PSAs have ranged from 3-4 through 2015 when he started finasteride, then had an appropriate decrease in his PSA to about 1.8, and has been stable over the last 5 years.  Most recent prostate MRI in 2016 was negative for malignancy and prostate volume was 50 g.  I personally viewed and interpreted the MRI.  At our last visit we stopped the finasteride and he has just been on alfuzosin.  Primary urinary complaint is nocturia 3 times per night.  He does pretty well during the day aside from some weak stream in the morning.  PVR is normal today at 35 mL.  IPSS score today is 9, with quality of life pleased.  Overall, he is doing well.  We discussed that he is past the age of routine PSA screening and has had extensive work-up.  He is okay with discontinuing PSA screening, and we again reviewed the risks and benefits.  Regarding his urinary symptoms he is satisfied on alfuzosin for the time being.  We discussed alternative options like UroLift or HOLEP and the differences between these procedures.  Since his prostate volume was last updated over 5 years ago, I would recommend cystoscopy and TRUS prior to undergoing any outlet procedure in future  Alfuzosin refilled RTC 1 year IPSS and PVR Cystoscopy and TRUS if he desires an outlet procedure   Billey Co, MD  [facility] [address] [place], [place] [address] [phone]

## 2020-05-29 NOTE — Telephone Encounter (Signed)
-----   Message from Billey Co, MD sent at 05/29/2020 12:50 PM EDT ----- Regarding: UroLift and MRI Can you let him know that UroLift is compatible with prostate MRI.  I looked at his prostate MRI from 2016 and his prostate was 50 g at that time which would be slightly larger than average.  That was 5 years ago and so if we opted to pursue a outlet procedure, we would need to remeasure prostate with TRUS.  Follow-up as scheduled, okay to set up for cystoscopy and TRUS if he has worsening urinary symptoms and wants to think more about outlet procedures  Norman Madrid, MD 05/29/2020

## 2020-06-01 ENCOUNTER — Encounter: Payer: Self-pay | Admitting: Cardiology

## 2020-06-07 ENCOUNTER — Other Ambulatory Visit: Payer: Self-pay | Admitting: Orthopedic Surgery

## 2020-06-11 ENCOUNTER — Telehealth: Payer: Self-pay

## 2020-06-11 NOTE — Telephone Encounter (Signed)
[facility] - Client TELEPHONE ADVICE RECORD [facility] Patient Name: Norman Mitchell Gender: Male DOB: 05/21/1940 Age: 80 Y 69 M Return Phone Number: [phone] (Primary), [phone] (Secondary) Address: City/ State/ Zip: [place]  [address] Client [facility] - Client Client Site [facility] - Day Physician Renford Dills - MD Contact Type Call Who Is Calling Patient / Member / Family / Caregiver Call Type Triage / Clinical Relationship To Patient Self Return Phone Number [phone] (Primary) Chief Complaint Diarrhea Reason for Call Symptomatic / Request for [place] states for 3 to 4 weeks has had a lot of gas and diarrhea for the past week that is watery. no appointments available in the office Translation No Nurse Assessment Nurse: Ysidro Evert, RN, Levada Dy Date/Time Eilene Ghazi Time): 06/11/2020 11:43:21 AM Confirm and document reason for call. If symptomatic, describe symptoms. ---Caller states he has been having diarrhea for the past week. No fever and no vomiting Does the patient have any new or worsening symptoms? ---Yes Will a triage be completed? ---Yes Related visit to physician within the last 2 weeks? ---No Does the PT have any chronic conditions? (i.e. diabetes, asthma, this includes High risk factors for pregnancy, etc.) ---No Is this a behavioral health or substance abuse call? ---No Guidelines Guideline Title Affirmed Question Affirmed Notes Nurse Date/Time (Eastern Time) Diarrhea [1] Mucus or pus in stool AND [2] present > 2 days AND [3] diarrhea is more than mild Ysidro Evert, RN, Levada Dy 06/11/2020 11:44:45 AM Disp. Time Eilene Ghazi Time) Disposition Final User 06/11/2020 11:48:16 AM See PCP within 24 Hours Yes Ysidro Evert, RN, Levada Dy PLEASE NOTE: All timestamps contained within this report are represented as [place] Standard Time. CONFIDENTIALTY  NOTICE: This fax transmission is intended only for the addressee. It contains information that is legally privileged, confidential or otherwise protected from use or disclosure. If you are not the intended recipient, you are strictly prohibited from reviewing, disclosing, copying using or disseminating any of this information or taking any action in reliance on or regarding this information. If you have received this fax in error, please notify us immediately by telephone so that we can arrange for its return to [place]. Phone: [phone], Toll-Free: [phone], Fax: [phone] Page: 2 of 2 Call Id: [id number] [place] Disagree/Comply Comply Caller Understands Yes PreDisposition Did not know what to do Care Advice Given Per Guideline SEE PCP WITHIN 24 HOURS: CALL BACK IF: * Signs of dehydration occur (e.g., no urine over 12 hours, very dry mouth, lightheaded, etc.) * Constant or severe abdomen pain * You become worse * Bloody stools CARE ADVICE given per Diarrhea (Adult) guideline. Referrals REFERRED TO PCP OFFICE

## 2020-06-11 NOTE — Telephone Encounter (Signed)
Patient has an appointment with Allie Bossier, NP tomorrow.

## 2020-06-11 NOTE — Telephone Encounter (Signed)
I think OV tomorrow makes sense.  Thanks.  Routed as FYI.

## 2020-06-12 ENCOUNTER — Encounter: Payer: Self-pay | Admitting: Primary Care

## 2020-06-12 ENCOUNTER — Other Ambulatory Visit: Payer: Self-pay

## 2020-06-12 ENCOUNTER — Telehealth: Payer: Self-pay | Admitting: Family Medicine

## 2020-06-12 ENCOUNTER — Encounter: Payer: Self-pay | Admitting: Family Medicine

## 2020-06-12 ENCOUNTER — Ambulatory Visit (INDEPENDENT_AMBULATORY_CARE_PROVIDER_SITE_OTHER): Payer: Medicare Other | Admitting: Primary Care

## 2020-06-12 VITALS — BP 145/88 | HR 77 | Temp 98.3°F | Ht 70.0 in | Wt 173.0 lb

## 2020-06-12 DIAGNOSIS — R197 Diarrhea, unspecified: Secondary | ICD-10-CM | POA: Diagnosis not present

## 2020-06-12 DIAGNOSIS — R1084 Generalized abdominal pain: Secondary | ICD-10-CM

## 2020-06-12 LAB — CBC WITH DIFFERENTIAL/PLATELET
Basophils Absolute: 0 10*3/uL (ref 0.0–0.1)
Basophils Relative: 0.7 % (ref 0.0–3.0)
Eosinophils Absolute: 0.1 10*3/uL (ref 0.0–0.7)
Eosinophils Relative: 2.1 % (ref 0.0–5.0)
HCT: 45.2 % (ref 39.0–52.0)
Hemoglobin: 15.2 g/dL (ref 13.0–17.0)
Lymphocytes Relative: 14.5 % (ref 12.0–46.0)
Lymphs Abs: 0.7 10*3/uL (ref 0.7–4.0)
MCHC: 33.5 g/dL (ref 30.0–36.0)
MCV: 89.1 fl (ref 78.0–100.0)
Monocytes Absolute: 0.6 10*3/uL (ref 0.1–1.0)
Monocytes Relative: 12.1 % — ABNORMAL HIGH (ref 3.0–12.0)
Neutro Abs: 3.4 10*3/uL (ref 1.4–7.7)
Neutrophils Relative %: 70.6 % (ref 43.0–77.0)
Platelets: 102 10*3/uL — ABNORMAL LOW (ref 150.0–400.0)
RBC: 5.07 Mil/uL (ref 4.22–5.81)
RDW: 13.8 % (ref 11.5–15.5)
WBC: 4.8 10*3/uL (ref 4.0–10.5)

## 2020-06-12 LAB — COMPREHENSIVE METABOLIC PANEL
ALT: 14 U/L (ref 0–53)
AST: 20 U/L (ref 0–37)
Albumin: 4 g/dL (ref 3.5–5.2)
Alkaline Phosphatase: 70 U/L (ref 39–117)
BUN: 18 mg/dL (ref 6–23)
CO2: 32 mEq/L (ref 19–32)
Calcium: 9.3 mg/dL (ref 8.4–10.5)
Chloride: 104 mEq/L (ref 96–112)
Creatinine, Ser: 1.22 mg/dL (ref 0.40–1.50)
GFR: 57.16 mL/min — ABNORMAL LOW (ref >60.00)
Glucose, Bld: 69 mg/dL — ABNORMAL LOW (ref 70–99)
Potassium: 4 mEq/L (ref 3.5–5.1)
Sodium: 140 mEq/L (ref 135–145)
Total Bilirubin: 2 mg/dL — ABNORMAL HIGH (ref 0.2–1.2)
Total Protein: 6.8 g/dL (ref 6.0–8.3)

## 2020-06-12 LAB — LIPASE: Lipase: 23 U/L (ref 11.0–59.0)

## 2020-06-12 NOTE — Assessment & Plan Note (Signed)
Acute for the last week, improved this morning.  Today he appears well, no distress and does not appear acutely ill.  Doesn't appear to have a history of diverticulosis. Could have had viral GI bug. Will rule out pancreatitis as he had a remote history of this in the past.   Checking labs today including lipase, CBC, CMP. Also provided GI stool kit for him to take home and return if symptoms returned.   Discussed bland diet, water.  He will update if symptoms return.

## 2020-06-12 NOTE — Progress Notes (Signed)
Subjective:    Patient ID: Norman Mitchell, male    DOB: 1940/12/11, 80 y.o.   MRN: [id number]  HPI  Norman Mitchell is a very pleasant 80 y.o. male patient of Dr. Damita Dunnings with a history of paroxysmal atrial fibrillation, hypertension, aortic dilation, OSA, hypothyroidism, thrombocytopenia, who presents today with a chief complaint of diarrhea.   He also endorses a four week history of gas/bloating, "stomach rumbling" to the generalized abdomen. He was experiencing loose stools 1-2 times daily for about one week. This morning he had a normal bowel movement, began to feel better last night.   He denies dark stools, bright red rectal bleeding, fevers, nausea, vomiting. He is compliant to his Eliquis, has not taken anything OTC for symptoms. No colonoscopy on file, last colonoscopy per patient was six years ago in [place], denies history of diverticulosis. He also denies recent antibiotic use over last few months. No one else in his home had these symptoms.   He's been eating and drinking well throughout the duration of his symptoms. Has been eating a bland diet.   Review of Systems  Constitutional: Negative for fatigue and fever.  Gastrointestinal: Positive for abdominal pain and diarrhea. Negative for blood in stool, nausea and vomiting.       See HPI  Neurological: Negative for dizziness.         Past Medical History:  Diagnosis Date  . Arthritis    Hands, knee  . Back pain   . Complication of anesthesia    Was told after 1 surgery that his HR went "really low".  Says his normal HR is 50-55.  Marland Kitchen DVT (deep venous thrombosis) ([place]) 2017   Bilateral knees.  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  . Hypothyroidism   . 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  . Paroxysmal atrial fibrillation ([place])   . Prostatitis   . Scoliosis   . Thrombocytopenia ([place])   . Venous insufficiency     Social History   Socioeconomic History  . Marital status: Married    Spouse name: Not on file  . Number of  children: Not on file  . Years of education: Not on file  . Highest education level: Not on file  Occupational History  . Not on file  Tobacco Use  . Smoking status: Never Smoker  . Smokeless tobacco: Never Used  Vaping Use  . Vaping Use: Never used  Substance and Sexual Activity  . Alcohol use: Never  . Drug use: Never  . Sexual activity: Not on file  Other Topics Concern  . Not on file  Social History Narrative   From [place], [place].    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   [employer] [phone] through 35.  E5.  No service related issues.   Enjoys hiking, painting, Scientist, water quality, woodworking   [facility].   Social Determinants of Health   Financial Resource Strain: Low Risk   . Difficulty of Paying Living Expenses: Not hard at all  Food Insecurity: No Food Insecurity  . Worried About Charity fundraiser in the Last Year: Never true  . Ran Out of Food in the Last Year: Never true  Transportation Needs: No Transportation Needs  . Lack of Transportation (Medical): No  . Lack of Transportation (Non-Medical): No  Physical Activity: Sufficiently Active  . Days of Exercise per Week: 7 days  . Minutes of Exercise per  Session: 50 min  Stress: No Stress Concern Present  . Feeling of Stress : Not at all  Social Connections: Not on file  Intimate Partner Violence: Not At Risk  . Fear of Current or Ex-Partner: No  . Emotionally Abused: No  . Physically Abused: No  . Sexually Abused: No    Past Surgical History:  Procedure Laterality Date  . KNEE ARTHROSCOPY    . KNEE ARTHROSCOPY WITH MEDIAL MENISECTOMY Right 01/09/2020   Procedure: RIGHT KNEE ARTHROSCOPY WITH MEDIAL MENISECTOMY;  Surgeon: Thornton Park, MD;  Location: [facility];  Service: Orthopedics;  Laterality: Right;  . Oral biopsy    . PILONIDAL CYST EXCISION    . PROSTATE BIOPSY     Negative  . SEPTOPLASTY    . TONSILLECTOMY       Family History  Problem Relation Age of Onset  . Heart disease Mother   . High Cholesterol Father   . High blood pressure Father   . Stroke Father   . Prostate cancer Paternal Grandfather        Possible diagnosis, not definite.  . Colon cancer Neg Hx     Allergies  Allergen Reactions  . Bactrim [Sulfamethoxazole-Trimethoprim] Hives  . Ciprofloxacin Other (See Comments)    Leg tendonitis   . Ibuprofen Other (See Comments)    Renal failure with high doses.   . Levofloxacin Other (See Comments)    Severe leg tendonitis    Current Outpatient Medications on File Prior to Visit  Medication Sig Dispense Refill  . albuterol (VENTOLIN HFA) 108 (90 Base) MCG/ACT inhaler Inhale into the lungs as needed.     Marland Kitchen alfuzosin (UROXATRAL) 10 MG 24 hr tablet Take 1 tablet (10 mg total) by mouth daily. 90 tablet 3  . apixaban (ELIQUIS) 5 MG TABS tablet Take 1 tablet (5 mg total) by mouth 2 (two) times daily. 180 tablet 1  . CRANBERRY EXTRACT PO Take 500 mg by mouth 3 (three) times a week.     . levothyroxine (SYNTHROID) 75 MCG tablet TAKE ONE TABLET BY MOUTH DAILY 90 tablet 3  . Lutein 20 MG CAPS Take by mouth.    . Multiple Vitamins-Minerals (SENIOR MULTIVITAMIN PLUS PO)     . NON FORMULARY Cocoavia 750 mg daily.    . Omega-3 1000 MG CAPS Take by mouth. 3 times a week    . triamcinolone cream (KENALOG) 0.1 % triamcinolone acetonide 0.1 % topical cream     No current facility-administered medications on file prior to visit.    BP (!) 145/88   Pulse 77   Temp 98.3 F (36.8 C) (Temporal)   Ht 5\' 10"  (1.778 m)   Wt 173 lb (78.5 kg)   SpO2 95%   BMI 24.82 kg/m  Objective:   Physical Exam Constitutional:      Appearance: He is not ill-appearing.  Cardiovascular:     Rate and Rhythm: Normal rate.  Pulmonary:     Effort: Pulmonary effort is normal.     Breath sounds: No wheezing or rales.  Abdominal:     General: Abdomen is flat. Bowel sounds are normal.     Palpations: Abdomen  is soft.     Tenderness: There is no abdominal tenderness.  Musculoskeletal:     Cervical back: Neck supple.  Skin:    General: Skin is warm and dry.  Neurological:     Mental Status: He is alert and oriented to person, place, and time.  Assessment & Plan:      This visit occurred during the SARS-CoV-2 public health emergency.  Safety protocols were in place, including screening questions prior to the visit, additional usage of staff PPE, and extensive cleaning of exam room while observing appropriate contact time as indicated for disinfecting solutions.

## 2020-06-12 NOTE — Telephone Encounter (Signed)
Patient is scheduled on May 3 for right shoulder rotator cuff surgery at [facility]. Dr. Mack Guise will be the surgeon ([facility]).  Please send a copy of the patient's blood counts, especially his platelets.  Patient has a history of thrombocytopenia but I do not expect that to change his surgical course.  Thanks.

## 2020-06-12 NOTE — Addendum Note (Signed)
Addended by: Ellamae Sia on: 06/12/2020 09:53 AM   Modules accepted: Orders

## 2020-06-12 NOTE — Patient Instructions (Signed)
Continue to hydrate well with water.  Stop by the lab prior to leaving today. I will notify you of your results once received.   Advance your diet slowly as tolerated.   Please notify us if your symptoms return.  It was a pleasure meeting you!   Food Choices to Help Relieve Diarrhea, Adult Diarrhea can make you feel weak and cause you to become dehydrated. It is important to choose the right foods and drinks to:  Relieve diarrhea.  Replace lost fluids and nutrients.  Prevent dehydration. What are tips for following this plan? Relieving diarrhea  Avoid foods that make your diarrhea worse. These may include: ? Foods and beverages sweetened with high-fructose corn syrup, honey, or sweeteners such as xylitol, sorbitol, and mannitol. ? Fried, greasy, or spicy foods. ? Raw fruits and vegetables.  Eat foods that are rich in probiotics. These include foods such as yogurt and fermented milk products. Probiotics can help increase healthy bacteria in your stomach and intestines (gastrointestinal tract or GI tract). This may help digestion and stop diarrhea.  If you have lactose intolerance, avoid dairy products. These may make your diarrhea worse.  Take medicine to help stop diarrhea only as told by your health care provider. Replacing nutrients  Eat bland, easy-to-digest foods in small amounts as you are able, until your diarrhea starts to get better. These foods include bananas, applesauce, rice, toast, and crackers.  Gradually reintroduce nutrient-rich foods as tolerated or as told by your health care provider. This includes: ? Well-cooked protein foods, such as eggs, lean meats like fish or chicken without skin, and tofu. ? Peeled, seeded, and soft-cooked fruits and vegetables. ? Low-fat dairy products. ? Whole grains.  Take vitamin and mineral supplements as told by your health care provider.   Preventing dehydration  Start by sipping water or a solution to prevent dehydration  (oral rehydration solution, ORS). This is a drink that helps replace fluids and minerals your body has lost. You can buy an ORS at pharmacies and retail stores.  Try to drink at least 8-10 cups (2,000-2,500 mL) of fluid each day to help replace lost fluids. If you have urine that is pale yellow, you are getting enough fluids.  You may drink other liquids in addition to water, such as fruit juice that you have added water to (diluted fruit juice) or low-calorie sports drinks, as tolerated or as told by your health care provider.  Avoid drinks with caffeine, such as coffee, tea, or soft drinks.  Avoid alcohol.   Summary  When you have diarrhea, it is important to choose the right foods and drinks to relieve diarrhea, to replace lost fluids and nutrients, and to prevent dehydration.  Make sure you drink enough fluid to keep your urine pale yellow.  You may benefit from eating bland foods at first. Gradually reintroduce healthy, nutrient-rich foods as tolerated or as told by your health care provider.  Avoid foods that make your diarrhea worse, such as fried, greasy, or spicy foods. This information is not intended to replace advice given to you by your health care provider. Make sure you discuss any questions you have with your health care provider. Document Revised: 04/04/2019 Document Reviewed: 04/04/2019 Elsevier Patient Education  2021 [employer].

## 2020-06-13 NOTE — Telephone Encounter (Signed)
Labs have been printed and faxed to Dr. Mack Guise office

## 2020-06-14 ENCOUNTER — Encounter: Payer: Self-pay | Admitting: Cardiology

## 2020-06-14 ENCOUNTER — Other Ambulatory Visit: Payer: Self-pay

## 2020-06-14 ENCOUNTER — Ambulatory Visit (INDEPENDENT_AMBULATORY_CARE_PROVIDER_SITE_OTHER): Payer: Medicare Other | Admitting: Cardiology

## 2020-06-14 VITALS — BP 140/80 | HR 54 | Ht 70.0 in | Wt 174.0 lb

## 2020-06-14 DIAGNOSIS — R030 Elevated blood-pressure reading, without diagnosis of hypertension: Secondary | ICD-10-CM | POA: Diagnosis not present

## 2020-06-14 DIAGNOSIS — I480 Paroxysmal atrial fibrillation: Secondary | ICD-10-CM

## 2020-06-14 NOTE — Patient Instructions (Signed)

## 2020-06-14 NOTE — Progress Notes (Signed)
Cardiology Office Note:    Date:  06/14/2020   ID:  Adella Hare, DOB 01-16-1941, MRN [id number]  PCP:  Tonia Ghent, MD  Cardiologist:  Kate Sable, MD  Electrophysiologist:  None   Referring MD: Tonia Ghent, MD   Chief Complaint  Patient presents with  . Other    6 month follow up - Patient c.o swelling in ankles. Meds reviewed verbally with patient.     History of Present Illness:    Norman Mitchell is a 80 y.o. male with a hx of arthritis, DVT, paroxysmal atrial fibrillation (diagnosed May 2017) who presents for follow-up    Previously seen for atrial fibrillation and preop eval for knee surgery.  Tolerating Eliquis.  Not on AV nodal blockers due to sinus bradycardia with heart rates in the 40s occasionally.  Blood pressures usually controlled at home.  No complaints of edema.  Current episode of palpitations, outpatient cardiac device was used with heart rates averaging 89.  Patient denies having frequent palpitations, has not had one in almost 9 years.   Prior notes Patient had palpitations for years, eventually an EKG done at his primary care provider's office in Jul 02, 2015 showed atrial fibrillation with a ventricular rate of 101.  He was later on referred to see a cardiologist, and was evaluated by Dr. Nash Dimmer in June 2017 at [facility].  At the time his CHA2DS2-VASc score was 1.  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  He was managed with anticoagulation for 3 months with resolution.  He was later diagnosed with throat cancer and his malignancy was thought to cause the DVT.  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    Echo 03/2019 showed normal systolic and  diastolic function, EF 55 to 60%, normal aortic root and ascending aorta size, mild LVH.   Past Medical History:  Diagnosis Date  . Arthritis    Hands, knee  . Back pain   . Complication of anesthesia    Was told after 1 surgery that his HR went "really low".  Says his normal HR is 50-55.  Marland Kitchen DVT (deep venous thrombosis) ([place]) 2017   Bilateral knees.  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  . Hypothyroidism   . 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  . Paroxysmal atrial fibrillation ([place])   . Prostatitis   . Scoliosis   . Thrombocytopenia ([place])   . Venous insufficiency     Past Surgical History:  Procedure Laterality Date  . KNEE ARTHROSCOPY    . KNEE ARTHROSCOPY WITH MEDIAL MENISECTOMY Right 01/09/2020   Procedure: RIGHT KNEE ARTHROSCOPY WITH MEDIAL MENISECTOMY;  Surgeon: Thornton Park, MD;  Location: [place];  Service: Orthopedics;  Laterality: Right;  . Oral biopsy    . PILONIDAL CYST EXCISION    . PROSTATE BIOPSY     Negative  . SEPTOPLASTY    . TONSILLECTOMY      Current Medications: Current Meds  Medication Sig  . albuterol (VENTOLIN HFA) 108 (90 Base) MCG/ACT inhaler Inhale into the lungs as needed.   Marland Kitchen alfuzosin (UROXATRAL) 10 MG 24 hr tablet Take 1 tablet (10 mg total) by mouth daily.  Marland Kitchen apixaban (ELIQUIS) 5 MG TABS tablet Take  1 tablet (5 mg total) by mouth 2 (two) times daily.  Marland Kitchen CRANBERRY EXTRACT PO Take 500 mg by mouth 3 (three) times a week.   . levothyroxine (SYNTHROID) 75 MCG tablet TAKE ONE TABLET BY MOUTH DAILY  . Lutein 20 MG CAPS Take by mouth.  . Multiple Vitamins-Minerals (SENIOR MULTIVITAMIN PLUS PO)   . NON FORMULARY Cocoavia 750 mg daily.  . Omega-3 1000 MG CAPS Take by mouth. 3 times a week  . triamcinolone cream (KENALOG) 0.1 % triamcinolone acetonide 0.1 % topical cream     Allergies:   Bactrim [sulfamethoxazole-trimethoprim], Ciprofloxacin, Ibuprofen, and Levofloxacin   Social History   Socioeconomic History  . Marital status:  Married    Spouse name: Not on file  . Number of children: Not on file  . Years of education: Not on file  . Highest education level: Not on file  Occupational History  . Not on file  Tobacco Use  . Smoking status: Never Smoker  . Smokeless tobacco: Never Used  Vaping Use  . Vaping Use: Never used  Substance and Sexual Activity  . Alcohol use: Never  . Drug use: Never  . Sexual activity: Not on file  Other Topics Concern  . Not on file  Social History Narrative   From [place], [place].    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   [employer] [phone] through [employer].  E5.  No service related issues.   Enjoys hiking, painting, Scientist, water quality, woodworking   Lives at [facility].   Social Determinants of Health   Financial Resource Strain: Low Risk   . Difficulty of Paying Living Expenses: Not hard at all  Food Insecurity: No Food Insecurity  . Worried About Charity fundraiser in the Last Year: Never true  . Ran Out of Food in the Last Year: Never true  Transportation Needs: No Transportation Needs  . Lack of Transportation (Medical): No  . Lack of Transportation (Non-Medical): No  Physical Activity: Sufficiently Active  . Days of Exercise per Week: 7 days  . Minutes of Exercise per Session: 50 min  Stress: No Stress Concern Present  . Feeling of Stress : Not at all  Social Connections: Not on file     Family History: The patient's family history includes Heart disease in his mother; High Cholesterol in his father; High blood pressure in his father; Prostate cancer in his paternal grandfather; Stroke in his father. There is no history of Colon cancer.  ROS:   Please see the history of present illness.     All other systems reviewed and are negative.  EKGs/Labs/Other Studies Reviewed:    The following studies were reviewed today:    EKG:  EKG is  ordered today.  The ekg ordered today demonstrates sinus  bradycardia.  Recent Labs: 11/10/2019: TSH 4.11 06/12/2020: ALT 14; BUN 18; Creatinine, Ser 1.22; Hemoglobin 15.2; Platelets 102.0; Potassium 4.0; Sodium 140  Recent Lipid Panel    Component Value Date/Time   CHOL 137 11/10/2019 1016   CHOL 123 11/28/2018 0000   TRIG 66.0 11/10/2019 1016   TRIG 72 11/28/2018 0000   HDL 61.60 11/10/2019 1016   CHOLHDL 2 11/10/2019 1016   VLDL 13.2 11/10/2019 1016   LDLCALC 62 11/10/2019 1016   LDLCALC 53 11/28/2018 0000    Physical Exam:    VS:  BP 140/80 (BP Location: Left Arm, Patient Position: Sitting, Cuff Size: Normal)   Pulse Marland Kitchen)  54   Ht 5\' 10"  (1.778 m)   Wt 174 lb (78.9 kg)   SpO2 98%   BMI 24.97 kg/m     Wt Readings from Last 3 Encounters:  06/14/20 174 lb (78.9 kg)  06/12/20 173 lb (78.5 kg)  05/29/20 164 lb (74.4 kg)     GEN:  Well nourished, well developed in no acute distress HEENT: Normal NECK: No JVD; No carotid bruits LYMPHATICS: No lymphadenopathy CARDIAC: Regular, bradycardic, no murmurs, rubs, gallops RESPIRATORY:  Clear to auscultation without rales, wheezing or rhonchi  ABDOMEN: Soft, non-tender, non-distended MUSCULOSKELETAL:  No edema; No deformity  SKIN: Warm and dry NEUROLOGIC:  Alert and oriented x 3 PSYCHIATRIC:  Normal affect   ASSESSMENT:    1. PAF (paroxysmal atrial fibrillation) ([place])   2. 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  Paroxysmal atrial fibrillation, CHA2DS2-VASc score 2.  Previous echocardiogram showed normal systolic function, normal aortic root and ascending aorta size.  Continue Eliquis.  Patient currently in sinus rhythm.  Cardiac device/cardiac monitor reviewed by myself which was very regular, possible junctional tachycardia, ectopic atrial rhythm.  We will have patient see electrophysiology for any additional insight/input.  Feels okay, denies any symptoms.  2.  Patient has whitecoat syndrome, blood pressure at home usually low 100s to 130s.  Continue  to monitor off BP meds.  Total encounter time more than 30 minutes  Greater than 50% was spent in counseling and coordination of care with the patient    Medication Adjustments/Labs and Tests Ordered: Current medicines are reviewed at length with the patient today.  Concerns regarding medicines are outlined above.  Orders Placed This Encounter  Procedures  . Ambulatory referral to Cardiac Electrophysiology  . EKG 12-Lead   No orders of the defined types were placed in this encounter.   Patient Instructions  Medication Instructions:   Your physician recommends that you continue on your current medications as directed. Please refer to the Current Medication list given to you today.   *If you need a refill on your cardiac medications before your next appointment, please call your pharmacy*   Lab Work: None ordered If you have labs (blood work) drawn today and your tests are completely normal, you will receive your results only by: Marland Kitchen MyChart Message (if you have MyChart) OR . A paper copy in the mail If you have any lab test that is abnormal or we need to change your treatment, we will call you to review the results.   Testing/Procedures: None ordered   Follow-Up: At [facility], you and your health needs are our priority.  As part of our continuing mission to provide you with exceptional heart care, we have created designated Provider Care Teams.  These Care Teams include your primary Cardiologist (physician) and Advanced Practice Providers (APPs -  Physician Assistants and Nurse Practitioners) who all work together to provide you with the care you need, when you need it.  We recommend signing up for the patient portal called "MyChart".  Sign up information is provided on this After Visit Summary.  MyChart is used to connect with patients for Virtual Visits (Telemedicine).  Patients are able to view lab/test results, encounter notes, upcoming appointments, etc.  Non-urgent  messages can be sent to your provider as well.   To learn more about what you can do with MyChart, go to [email or web address].    Your next appointment:   6 month(s)  The format for  your next appointment:   In Person  Provider:   Kate Sable, MD   Other Instructions      Signed, Kate Sable, MD  06/14/2020 1:05 PM    [place]

## 2020-06-20 ENCOUNTER — Other Ambulatory Visit: Payer: Self-pay

## 2020-06-20 ENCOUNTER — Encounter: Payer: Self-pay | Admitting: Orthopedic Surgery

## 2020-06-21 DIAGNOSIS — M25461 Effusion, right knee: Secondary | ICD-10-CM | POA: Insufficient documentation

## 2020-06-21 DIAGNOSIS — M75110 Incomplete rotator cuff tear or rupture of unspecified shoulder, not specified as traumatic: Secondary | ICD-10-CM | POA: Insufficient documentation

## 2020-06-21 DIAGNOSIS — M1711 Unilateral primary osteoarthritis, right knee: Secondary | ICD-10-CM | POA: Insufficient documentation

## 2020-06-21 DIAGNOSIS — M659 Synovitis and tenosynovitis, unspecified: Secondary | ICD-10-CM | POA: Insufficient documentation

## 2020-06-28 ENCOUNTER — Other Ambulatory Visit: Payer: Self-pay

## 2020-06-28 ENCOUNTER — Other Ambulatory Visit
Admission: RE | Admit: 2020-06-28 | Discharge: 2020-06-28 | Disposition: A | Discharge status: Home / Self Care | Payer: Medicare Other | Source: Ambulatory Visit | Attending: Orthopedic Surgery | Admitting: Orthopedic Surgery

## 2020-06-28 DIAGNOSIS — Z01812 Encounter for preprocedural laboratory examination: Secondary | ICD-10-CM | POA: Insufficient documentation

## 2020-06-28 DIAGNOSIS — Z20822 Contact with and (suspected) exposure to covid-19: Secondary | ICD-10-CM | POA: Insufficient documentation

## 2020-06-28 LAB — SARS CORONAVIRUS 2 (TAT 6-24 HRS): SARS Coronavirus 2: NEGATIVE

## 2020-07-02 ENCOUNTER — Ambulatory Visit: Payer: Medicare Other | Admitting: Anesthesiology

## 2020-07-02 ENCOUNTER — Other Ambulatory Visit: Payer: Self-pay

## 2020-07-02 ENCOUNTER — Encounter
Admission: RE | Disposition: A | Discharge status: Home / Self Care | Payer: Self-pay | Source: Home / Self Care | Attending: Orthopedic Surgery

## 2020-07-02 ENCOUNTER — Encounter: Payer: Self-pay | Admitting: Orthopedic Surgery

## 2020-07-02 ENCOUNTER — Ambulatory Visit
Admission: RE | Admit: 2020-07-02 | Discharge: 2020-07-02 | Disposition: A | Discharge status: Home / Self Care | Payer: Medicare Other | Attending: Orthopedic Surgery | Admitting: Orthopedic Surgery

## 2020-07-02 DIAGNOSIS — E039 Hypothyroidism, unspecified: Secondary | ICD-10-CM | POA: Insufficient documentation

## 2020-07-02 DIAGNOSIS — I480 Paroxysmal atrial fibrillation: Secondary | ICD-10-CM | POA: Diagnosis not present

## 2020-07-02 DIAGNOSIS — Z823 Family history of stroke: Secondary | ICD-10-CM | POA: Diagnosis not present

## 2020-07-02 DIAGNOSIS — Z7951 Long term (current) use of inhaled steroids: Secondary | ICD-10-CM | POA: Insufficient documentation

## 2020-07-02 DIAGNOSIS — Z79890 Hormone replacement therapy (postmenopausal): Secondary | ICD-10-CM | POA: Insufficient documentation

## 2020-07-02 DIAGNOSIS — M75111 Incomplete rotator cuff tear or rupture of right shoulder, not specified as traumatic: Secondary | ICD-10-CM | POA: Diagnosis present

## 2020-07-02 DIAGNOSIS — Z7901 Long term (current) use of anticoagulants: Secondary | ICD-10-CM | POA: Diagnosis not present

## 2020-07-02 DIAGNOSIS — Z8349 Family history of other endocrine, nutritional and metabolic diseases: Secondary | ICD-10-CM | POA: Insufficient documentation

## 2020-07-02 DIAGNOSIS — M7541 Impingement syndrome of right shoulder: Secondary | ICD-10-CM | POA: Insufficient documentation

## 2020-07-02 DIAGNOSIS — Z8042 Family history of malignant neoplasm of prostate: Secondary | ICD-10-CM | POA: Diagnosis not present

## 2020-07-02 DIAGNOSIS — Z886 Allergy status to analgesic agent status: Secondary | ICD-10-CM | POA: Diagnosis not present

## 2020-07-02 DIAGNOSIS — Z881 Allergy status to other antibiotic agents status: Secondary | ICD-10-CM | POA: Diagnosis not present

## 2020-07-02 DIAGNOSIS — M719 Bursopathy, unspecified: Secondary | ICD-10-CM | POA: Diagnosis not present

## 2020-07-02 DIAGNOSIS — Z8249 Family history of ischemic heart disease and other diseases of the circulatory system: Secondary | ICD-10-CM | POA: Diagnosis not present

## 2020-07-02 DIAGNOSIS — Z79899 Other long term (current) drug therapy: Secondary | ICD-10-CM | POA: Insufficient documentation

## 2020-07-02 HISTORY — PX: SHOULDER ARTHROSCOPY WITH ROTATOR CUFF REPAIR AND SUBACROMIAL DECOMPRESSION: SHX5686

## 2020-07-02 SURGERY — SHOULDER ARTHROSCOPY WITH ROTATOR CUFF REPAIR AND SUBACROMIAL DECOMPRESSION
Anesthesia: Regional | Site: Shoulder | Laterality: Right

## 2020-07-02 MED ORDER — ACETAMINOPHEN 500 MG PO TABS
1000.0000 mg | ORAL_TABLET | ORAL | Status: AC
  Administered 2020-07-02: 1000 mg via ORAL

## 2020-07-02 MED ORDER — CHLORHEXIDINE GLUCONATE CLOTH 2 % EX PADS: 6.0000 | MEDICATED_PAD | Freq: Once | CUTANEOUS | Status: DC

## 2020-07-02 MED ORDER — BUPIVACAINE LIPOSOME 1.3 % IJ SUSP
INTRAMUSCULAR | Status: DC | PRN
  Administered 2020-07-02: 20 mL via PERINEURAL

## 2020-07-02 MED ORDER — FENTANYL CITRATE (PF) 100 MCG/2ML IJ SOLN
INTRAMUSCULAR | Status: DC | PRN
  Administered 2020-07-02: 50 ug via INTRAVENOUS

## 2020-07-02 MED ORDER — ONDANSETRON HCL 4 MG PO TABS: 4.0000 mg | ORAL_TABLET | Freq: Three times a day (TID) | ORAL | 0 refills | Status: DC | PRN

## 2020-07-02 MED ORDER — MIDAZOLAM HCL 5 MG/5ML IJ SOLN
INTRAMUSCULAR | Status: DC | PRN
  Administered 2020-07-02: 2 mg via INTRAVENOUS

## 2020-07-02 MED ORDER — ONDANSETRON HCL 4 MG/2ML IJ SOLN
INTRAMUSCULAR | Status: DC | PRN
  Administered 2020-07-02: 4 mg via INTRAVENOUS

## 2020-07-02 MED ORDER — CEFAZOLIN SODIUM-DEXTROSE 2-4 GM/100ML-% IV SOLN
2.0000 g | INTRAVENOUS | Status: AC
  Administered 2020-07-02: 2 g via INTRAVENOUS

## 2020-07-02 MED ORDER — OXYCODONE HCL 5 MG/5ML PO SOLN: 5.0000 mg | Freq: Once | ORAL | Status: DC | PRN

## 2020-07-02 MED ORDER — EPINEPHRINE PF 1 MG/ML IJ SOLN
INTRAMUSCULAR | Status: DC | PRN
  Administered 2020-07-02: 4 mL

## 2020-07-02 MED ORDER — FENTANYL CITRATE (PF) 100 MCG/2ML IJ SOLN: 25.0000 ug | INTRAMUSCULAR | Status: DC | PRN

## 2020-07-02 MED ORDER — LIDOCAINE HCL (CARDIAC) PF 100 MG/5ML IV SOSY
PREFILLED_SYRINGE | INTRAVENOUS | Status: DC | PRN
  Administered 2020-07-02: 20 mg via INTRATRACHEAL

## 2020-07-02 MED ORDER — OXYCODONE HCL 5 MG PO TABS: 5.0000 mg | ORAL_TABLET | Freq: Once | ORAL | Status: DC | PRN

## 2020-07-02 MED ORDER — ACETAMINOPHEN 325 MG PO TABS
325.0000 mg | ORAL_TABLET | ORAL | Status: DC | PRN
Start: 2020-07-02 — End: 2020-07-02

## 2020-07-02 MED ORDER — GLYCOPYRROLATE 0.2 MG/ML IJ SOLN
INTRAMUSCULAR | Status: DC | PRN
  Administered 2020-07-02: .1 mg via INTRAVENOUS

## 2020-07-02 MED ORDER — ACETAMINOPHEN 160 MG/5ML PO SOLN: 325.0000 mg | ORAL | Status: DC | PRN

## 2020-07-02 MED ORDER — PROPOFOL 10 MG/ML IV BOLUS
INTRAVENOUS | Status: DC | PRN
  Administered 2020-07-02: 14 mg via INTRAVENOUS

## 2020-07-02 MED ORDER — BUPIVACAINE HCL (PF) 0.5 % IJ SOLN
INTRAMUSCULAR | Status: DC | PRN
  Administered 2020-07-02: 20 mL via PERINEURAL

## 2020-07-02 MED ORDER — OXYCODONE HCL 5 MG PO TABS: 5.0000 mg | ORAL_TABLET | ORAL | 0 refills | Status: DC | PRN

## 2020-07-02 MED ORDER — LACTATED RINGERS IV SOLN: INTRAVENOUS | Status: DC

## 2020-07-02 MED ORDER — EPHEDRINE SULFATE 50 MG/ML IJ SOLN
INTRAMUSCULAR | Status: DC | PRN
  Administered 2020-07-02 (×2): 5 mg via INTRAVENOUS
  Administered 2020-07-02: 10 mg via INTRAVENOUS
  Administered 2020-07-02: 5 mg via INTRAVENOUS

## 2020-07-02 MED ORDER — DEXAMETHASONE SODIUM PHOSPHATE 4 MG/ML IJ SOLN
INTRAMUSCULAR | Status: DC | PRN
  Administered 2020-07-02: 4 mg via INTRAVENOUS

## 2020-07-02 SURGICAL SUPPLY — 72 items
ADAPTER IRRIG TUBE 2 SPIKE SOL (ADAPTER) ×4 IMPLANT
ADPR TBG 2 SPK PMP STRL ASCP (ADAPTER) ×2
ANCH SUT 2 SUTTK 14.5 STRL (Anchor) ×1 IMPLANT
ANCH SUT 2 SUTTK 14.5X3 FW (Anchor) ×2 IMPLANT
ANCH SUT 5.5 KNTLS PEEK (Orthopedic Implant) ×2 IMPLANT
ANCH SUT Q-FX 2.8 (Anchor) ×1 IMPLANT
ANCHOR ALL-SUT Q-FIX 2.8 (Anchor) ×2 IMPLANT
ANCHOR STRTK 3X14.5 BIOC 1.3 (Anchor) ×1 IMPLANT
ANCHOR SUT  BIOC ST 3X14.5 (Anchor) ×2 IMPLANT
ANCHOR SUT 5.5 MULTIFIX (Orthopedic Implant) ×4 IMPLANT
ANCHOR SUT BIOC ST 3X14.5 (Anchor) ×2 IMPLANT
ANCHOR SUTURETAK 3X14.5 BIOC (Anchor) ×2 IMPLANT
BNDG COHESIVE 6X5 TAN STRL LF (GAUZE/BANDAGES/DRESSINGS) ×2 IMPLANT
BUR RADIUS 4.0X18.5 (BURR) ×2 IMPLANT
BUR RADIUS 5.5 (BURR) ×2 IMPLANT
CANNULA 5.75X7 CRYSTAL CLEAR (CANNULA) ×2 IMPLANT
CANNULA PARTIAL THREAD 2X7 (CANNULA) ×2 IMPLANT
CONNECTOR PERFECT PASSER (CONNECTOR) ×6 IMPLANT
COOLER POLAR GLACIER W/PUMP (MISCELLANEOUS) ×2 IMPLANT
COVER WAND RF STERILE (DRAPES) ×2 IMPLANT
DEVICE SUCT BLK HOLE OR FLOOR (MISCELLANEOUS) ×4 IMPLANT
DRAPE IMP U-DRAPE 54X76 (DRAPES) ×4 IMPLANT
DRAPE INCISE IOBAN 66X45 STRL (DRAPES) ×2 IMPLANT
DRAPE SHEET LG 3/4 BI-LAMINATE (DRAPES) ×2 IMPLANT
DRAPE U 60X70 (DRAPES) ×2 IMPLANT
DURAPREP 26ML APPLICATOR (WOUND CARE) ×6 IMPLANT
ELECT REM PT RETURN 9FT ADLT (ELECTROSURGICAL) ×2
ELECTRODE REM PT RTRN 9FT ADLT (ELECTROSURGICAL) ×1 IMPLANT
GAUZE SPONGE 4X4 12PLY STRL (GAUZE/BANDAGES/DRESSINGS) ×2 IMPLANT
GAUZE XEROFORM 1X8 LF (GAUZE/BANDAGES/DRESSINGS) ×2 IMPLANT
GLOVE SURG 9.0 ORTHO LTXF (GLOVE) ×6 IMPLANT
GLOVE SURG UNDER POLY LF SZ9 (GLOVE) ×2 IMPLANT
GOWN STRL REUS TWL 2XL XL LVL4 (GOWN DISPOSABLE) ×2 IMPLANT
GOWN STRL REUS W/ TWL LRG LVL3 (GOWN DISPOSABLE) ×1 IMPLANT
GOWN STRL REUS W/TWL LRG LVL3 (GOWN DISPOSABLE) ×2
IV LACTATED RINGER IRRG 3000ML (IV SOLUTION) ×26
IV LR IRRIG 3000ML ARTHROMATIC (IV SOLUTION) ×13 IMPLANT
KIT STABILIZATION SHOULDER (MISCELLANEOUS) ×2 IMPLANT
KIT SUTURE 2.8 Q-FIX DISP (MISCELLANEOUS) ×2 IMPLANT
KIT SUTURETAK 3.0 INSERT PERC (KITS) ×2 IMPLANT
KIT TURNOVER KIT A (KITS) ×2 IMPLANT
MANIFOLD NEPTUNE II (INSTRUMENTS) ×4 IMPLANT
MASK FACE SPIDER DISP (MASK) ×2 IMPLANT
MAT ABSORB  FLUID 56X50 GRAY (MISCELLANEOUS) ×3
MAT ABSORB FLUID 56X50 GRAY (MISCELLANEOUS) ×3 IMPLANT
NDL SAFETY ECLIPSE 18X1.5 (NEEDLE) ×1 IMPLANT
NEEDLE HYPO 18GX1.5 SHARP (NEEDLE) ×2
PACK ARTHROSCOPY SHOULDER (MISCELLANEOUS) ×2 IMPLANT
PAD WRAPON POLAR SHDR XLG (MISCELLANEOUS) ×1 IMPLANT
PASSER SUT FIRSTPASS SELF (INSTRUMENTS) ×2 IMPLANT
PENCIL SMOKE EVACUATOR (MISCELLANEOUS) ×2 IMPLANT
SET TUBE SUCT SHAVER OUTFL 24K (TUBING) ×2 IMPLANT
SET TUBE TIP INTRA-ARTICULAR (MISCELLANEOUS) ×2 IMPLANT
STRIP CLOSURE SKIN 1/2X4 (GAUZE/BANDAGES/DRESSINGS) ×2 IMPLANT
SUT ETHILON 4-0 (SUTURE) ×4
SUT ETHILON 4-0 FS2 18XMFL BLK (SUTURE) ×2
SUT LASSO 90 DEG SD STR (SUTURE) ×2 IMPLANT
SUT MNCRL 4-0 (SUTURE) ×2
SUT MNCRL 4-0 27XMFL (SUTURE) ×1
SUT PERFECTPASSER WHITE CART (SUTURE) ×2 IMPLANT
SUT SMART STITCH CARTRIDGE (SUTURE) ×4 IMPLANT
SUT VIC AB 0 CT1 36 (SUTURE) ×2 IMPLANT
SUT VIC AB 2-0 CT2 27 (SUTURE) IMPLANT
SUT VIC AB 2-0 SH 27 (SUTURE) ×2
SUT VIC AB 2-0 SH 27XBRD (SUTURE) ×1 IMPLANT
SUTURE ETHLN 4-0 FS2 18XMF BLK (SUTURE) ×2 IMPLANT
SUTURE MNCRL 4-0 27XMF (SUTURE) ×1 IMPLANT
SYR 10ML LL (SYRINGE) ×2 IMPLANT
TAPE MICROFOAM 4IN (TAPE) ×2 IMPLANT
TUBING ARTHRO INFLOW-ONLY STRL (TUBING) ×2 IMPLANT
WAND HAND CNTRL MULTIVAC 90 (MISCELLANEOUS) ×2 IMPLANT
WRAPON POLAR PAD SHDR XLG (MISCELLANEOUS) ×2

## 2020-07-02 NOTE — Anesthesia Pain Management Evaluation Note (Signed)
Assisted Elsje Harker, ANMD  with right, ultrasound guided, interscalene  block. Side rails up, monitors on throughout procedure. See vital signs in flow sheet. Tolerated Procedure well.  

## 2020-07-02 NOTE — Anesthesia Postprocedure Evaluation (Signed)
Anesthesia Post Note  Patient: Norman Mitchell  Procedure(s) Performed: RIGHT SHOULDER ARTHROSCOPY  DISTAL CLAVICLE EXCISION, BICEPS TENODESIS, SUBACROMIAL DECOMPRESSION, ANTERIOR LABRAL REPAIR. AND MINI OPEN ROTATOR CUFF REPAIR (Right Shoulder)     Patient location during evaluation: PACU Anesthesia Type: Regional and General Level of consciousness: awake Pain management: pain level controlled Vital Signs Assessment: post-procedure vital signs reviewed and stable Respiratory status: respiratory function stable Cardiovascular status: stable Postop Assessment: no signs of nausea or vomiting Anesthetic complications: no   No complications documented.  Veda Canning

## 2020-07-02 NOTE — Op Note (Signed)
07/02/2020  4:47 PM  PATIENT:  Norman Mitchell  80 y.o. 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 male with a diagnosis of Right Shoulder Rotator Cuff Tear failed conservative treatment and elected for surgical management.    The risks benefits and alternatives were discussed with the patient preoperatively including but not limited to the risks of infection, bleeding, nerve injury, persistent pain or weakness, shoulder stiffness/arthrofibrosis, failure of the repair, re-tear of the rotator cuff and the need for further surgery. Medical risks include DVT and pulmonary embolism, myocardial infarction, stroke, pneumonia, respiratory failure and death. Patient understood these risks and wished to proceed.  OPERATIVE IMPLANTS: [place] MultiFix anchors x 2 for biceps tenodesis and lateral row fixation & a Smith & Nephew Q Fix anchor x 1 for medial row fixation and Arthrex bio suture tack anchors x3 for an anterior capsulorrhaphy and superior labral repair  OPERATIVE FINDINGS: Patient had a near full-thickness rotator cuff tear following the supraspinatus.  Patient had a capsular sprain with increased anterior humeral head translation under arthroscopic inspection.  Patient had a superior labral tear.  He had a low-grade partial tear of the subscapularis allowing for medial subluxation of the biceps.  OPERATIVE PROCEDURE: The  patient was met in the preoperative area. The right shoulder was signed with the word yes and my initials according the hospital's correct site of surgery protocol.   A pre-op history and physical was performed at the bedside.   Patient underwent an interscalene block by the anesthesia service in the preoperative area.  Patient was brought to the operating room where he underwent general anesthesia.  The patient was placed in a beachchair position.  A spider arm positioner was used for this case.  Examination under anesthesia revealed no loss of passive range of motion or significant instability with load shift testing. The patient had a negative sulcus sign.  Patient was prepped and draped in a sterile fashion. A timeout was performed to verify the patient's name, date of birth, medical record number, correct site of surgery and correct procedure to be performed there was also used to verify the patient received antibiotics that all appropriate instruments, implants and radiographs studies were available in the room. Once all in attendance were in agreement case began.  Patient received ancef 2 grams IV for pre-op antibiotics.  Bony landmarks were drawn out with a surgical marker along with proposed arthroscopy incisions.  An 11 blade was used to establish a posterior portal through which the arthroscope was placed in the glenohumeral joint. A full diagnostic examination of the shoulder was performed.  The patient was found to have a SLAP tear with an apparent strain to the anterior capsule allowing for increased anterior translation of the humeral head.  The decision was made to perform a biceps tenodesis and anterior capsulorrhaphy.  A single perfect pass suture was placed into the intra-articular portion of the biceps.  The biceps was then released off the superior labrum with a 90 degree werewolf wand.  The suture was  brought out through the lateral portal and clamped for later biceps tenodesis.    Three  Arthrex biosuture tak anchors were placed along the anterior glenoid to at approximately the 3 and 4 o'clock positions.   A 90 degree suture lasso was used to pass a single limb of each of these anchors under the anterior capsule and an arthroscopic knot tying technique was used to approximate the capsule to the glenoid.  A third Arthrex bio suture tack anchor was placed at the superior labrum given its excessive laxity due to the SLAP tear.  The superior labrum was prepped with a a 5.5 mm resector shaver blade until punctate bleeding was identified.  Again a 90 degree suture lasso was used to pass 1 limb of this third anchor under the superior labrum and arthroscopic knot tying technique was used to stabilize the superior labrum to the superior glenoid.Final arthroscopic images of the repair were taken.  The arthroscope was then placed in the subacromial space.  Extensive bursitis was encountered and debrided using a 4-0 resector shaver blade and a 90 ArthroCare wand from a lateral portal which was established under direct visualization using an 18-gauge spinal needle. A subacromial decompression was performed sing a 5.5 mm resector shaver blade from the lateral portal.   A distal clavicle excision was then performed through the anterior portal also using the 5.5 mm resector shaver blade.  A 5.5 mm resector shaver blade was then used to debride the greater tuberosity of all torn fibers of the rotator cuff.   Two Perfect Pass sutures were placed in the lateral border of the rotator cuff tear. All arthroscopic instruments were then removed and the mini-open portion of the procedure began.    A saber-type incision was made along the lateral border of the acromion. The deltoid muscle was identified and split in line with its fibers which allowed visualization of the rotator cuff. The Perfect Pass sutures previously placed in the lateral border of the rotator cuff and biceps tendon were brought out through the  deltoid split.  A single [place] MultiFix anchor was used to perform an open biceps tenodesis at the top of the into tuberous groove.  A single [employer] anchor was placed at the articular margin of the humeral head with the greater tuberosity. The suture limbs of the Q Fix anchors were passed medially through the rotator cuff using a First Pass suture passer.   A third perfect pass suture was placed in the lateral border of the rotator cuff.  A total of three Perfect Pass sutures were then anchored to the greater tuberosity footprint using a single [employer] Multifix anchor. The sutures passed through the Multifix anchor were then tensioned to allow reduction of the rotator cuff to the greater tuberosity footprint. The medial row repair was then performed using the Q fix sutures, which were tied down using an arthroscopic knot tying technique.  Arthroscopic images of the repair were taken with the arthroscope both externally and from inside the glenohumeral joint.  All incisions were copiously irrigated. The deltoid fascia was repaired using a 0 Vicryl suture.  The subcutaneous tissue of all incisions were closed with a 2-0 Vicryl. Skin closure for the arthroscopic incisions was performed with 4-0 nylon. The skin edges of the saber incision was approximated with a running 4-0 undyed Monocryl.  A dry sterile dressing was applied.  The patient was placed in an abduction sling and a Polar Care  was applied to the shoulder.  All sharp, sponge and it instrument counts were correct at the conclusion of the case. I was scrubbed and present for the entire case. I spoke with the patient's wife postoperatively to let her know the case had been performed without complication and the patient was stable in recovery room.

## 2020-07-02 NOTE — Discharge Instructions (Signed)
PERIPHERAL NERVE BLOCK PATIENT INFORMATION  Your surgeon has requested a peripheral nerve block for your surgery. This anesthetic technique provides excellent post-operative pain relief for you in a safe and effective manner. It will also help reduce the risk of nausea and vomiting and allow earlier discharge from the hospital.   The block is performed under sedation with ultrasound guidance prior to your procedure. Due to the sedation, your may or may not remember the block experience. The nerve block will begin to take effect anywhere from 5 to 30 minutes after being administered. You will be transported to the operating room from your surgery after the block is completed.   At the end of surgery, when the anesthesia wears off, you will notice a few things. Your may not be able to move or feel the part of your body targeted by the nerve block. These are normal experiences, and they will disappear as the block wears off.  If you had an interscalene nerve block performed (which is common for shoulder surgery), your voice can be very hoarse and you may feel that you are not able to take as deep a breath as you did before surgery. Some patients may also notice a droopy eyelid on the affected side. These symptoms will resolve once the block wears off.  Pain control: The nerve block technique used is a single injection that can last anywhere from 1-3 days. The duration of the numbness can vary between individuals. After leaving the hospital, it is important that you begin to take your prescribed pain medication when you start to sense the nerve block wearing off. This will help you avoid unpleasant pain at the time the nerve block wears off, which can sometimes be in the middle of the night. The block will only cover pain in the areas targeted by the nerve block so if you experience surgical pain outside of that area, please take your prescribed pain medication. Management of the "numb area": After a nerve  block, you cannot feel pain, pressure, or temperature in the affected area so there is an increased risk for injury. You should take extra care to protect the affected areas until sensation and movement returns. Please take caution to not come in contact with extremely hot or cold items because you will not be able to sense or protect yourself form the extremes of temperature.  You may experience some persistent numbness after the procedure by most neurological deficits resolve over time and the incidence of serious long term neurological complications attributable to peripheral nerve blocks are relatively uncommon.    Information for Discharge Teaching: EXPAREL (bupivacaine liposome injectable suspension)   Your surgeon or anesthesiologist gave you EXPAREL(bupivacaine) to help control your pain after surgery.   EXPAREL is a local anesthetic that provides pain relief by numbing the tissue around the surgical site.  EXPAREL is designed to release pain medication over time and can control pain for up to 72 hours.  Depending on how you respond to EXPAREL, you may require less pain medication during your recovery.  Possible side effects:  Temporary loss of sensation or ability to move in the area where bupivacaine was injected.  Nausea, vomiting, constipation  Rarely, numbness and tingling in your mouth or lips, lightheadedness, or anxiety may occur.  Call your doctor right away if you think you may be experiencing any of these sensations, or if you have other questions regarding possible side effects.  Follow all other discharge instructions given to you by  your surgeon or nurse. Eat a healthy diet and drink plenty of water or other fluids.  If you return to the hospital for any reason within 96 hours following the administration of EXPAREL, it is important for health care providers to know that you have received this anesthetic. A teal colored band has been placed on your arm with the date,  time and amount of EXPAREL you have received in order to alert and inform your health care providers. Please leave this armband in place for the full 96 hours following administration, and then you may remove the band.  General Anesthesia, Adult, Care After This sheet gives you information about how to care for yourself after your procedure. Your health care provider may also give you more specific instructions. If you have problems or questions, contact your health care provider. What can I expect after the procedure? After the procedure, the following side effects are common:  Pain or discomfort at the IV site.  Nausea.  Vomiting.  Sore throat.  Trouble concentrating.  Feeling cold or chills.  Feeling weak or tired.  Sleepiness and fatigue.  Soreness and body aches. These side effects can affect parts of the body that were not involved in surgery. Follow these instructions at home: For the time period you were told by your health care provider:  Rest.  Do not participate in activities where you could fall or become injured.  Do not drive or use machinery.  Do not drink alcohol.  Do not take sleeping pills or medicines that cause drowsiness.  Do not make important decisions or sign legal documents.  Do not take care of children on your own.   Eating and drinking  Follow any instructions from your health care provider about eating or drinking restrictions.  When you feel hungry, start by eating small amounts of foods that are soft and easy to digest (bland), such as toast. Gradually return to your regular diet.  Drink enough fluid to keep your urine pale yellow.  If you vomit, rehydrate by drinking water, juice, or clear broth. General instructions  If you have sleep apnea, surgery and certain medicines can increase your risk for breathing problems. Follow instructions from your health care provider about wearing your sleep device: ? Anytime you are sleeping,  including during daytime naps. ? While taking prescription pain medicines, sleeping medicines, or medicines that make you drowsy.  Have a responsible adult stay with you for the time you are told. It is important to have someone help care for you until you are awake and alert.  Return to your normal activities as told by your health care provider. Ask your health care provider what activities are safe for you.  Take over-the-counter and prescription medicines only as told by your health care provider.  If you smoke, do not smoke without supervision.  Keep all follow-up visits as told by your health care provider. This is important. Contact a health care provider if:  You have nausea or vomiting that does not get better with medicine.  You cannot eat or drink without vomiting.  You have pain that does not get better with medicine.  You are unable to pass urine.  You develop a skin rash.  You have a fever.  You have redness around your IV site that gets worse. Get help right away if:  You have difficulty breathing.  You have chest pain.  You have blood in your urine or stool, or you vomit blood. Summary  After the procedure, it is common to have a sore throat or nausea. It is also common to feel tired.  Have a responsible adult stay with you for the time you are told. It is important to have someone help care for you until you are awake and alert.  When you feel hungry, start by eating small amounts of foods that are soft and easy to digest (bland), such as toast. Gradually return to your regular diet.  Drink enough fluid to keep your urine pale yellow.  Return to your normal activities as told by your health care provider. Ask your health care provider what activities are safe for you. This information is not intended to replace advice given to you by your health care provider. Make sure you discuss any questions you have with your health care provider. Document Revised:  11/02/2019 Document Reviewed: 06/01/2019 Elsevier Patient Education  Welcome.      Interscalene Nerve Block with Exparel  1.  For your surgery you have received an Interscalene Nerve Block with Exparel. 2. Nerve Blocks affect many types of nerves, including nerves that control movement, pain and normal sensation.  You may experience feelings such as numbness, tingling, heaviness, weakness or the inability to move your arm or the feeling or sensation that your arm has "fallen asleep". 3. A nerve block with Exparel can last up to 5 days.  Usually the weakness wears off first.  The tingling and heaviness usually wear off next.  Finally you may start to notice pain.  Keep in mind that this may occur in any order.  Once a nerve block starts to wear off it is usually completely gone within 60 minutes. 4. ISNB may cause mild shortness of breath, a hoarse voice, blurry vision, unequal pupils, or drooping of the face on the same side as the nerve block.  These symptoms will usually resolve with the numbness.  Very rarely the procedure itself can cause mild seizures. 5. If needed, your surgeon will give you a prescription for pain medication.  It will take about 60 minutes for the oral pain medication to become fully effective.  So, it is recommended that you start taking this medication before the nerve block first begins to wear off, or when you first begin to feel discomfort. 6. Take your pain medication only as prescribed.  Pain medication can cause sedation and decrease your breathing if you take more than you need for the level of pain that you have. 7. Nausea is a common side effect of many pain medications.  You may want to eat something before taking your pain medicine to prevent nausea. 8. After an Interscalene nerve block, you cannot feel pain, pressure or extremes in temperature in the effected arm.  Because your arm is numb it is at an increased risk for injury.  To decrease the  possibility of injury, please practice the following:  a. While you are awake change the position of your arm frequently to prevent too much pressure on any one area for prolonged periods of time. b.  If you have a cast or tight dressing, check the color or your fingers every couple of hours.  Call your surgeon with the appearance of any discoloration (white or blue). c. If you are given a sling to wear before you go home, please wear it  at all times until the block has completely worn off.  Do not get up at night without your sling. d. Please contact [facility] or your surgeon if you do not begin to regain sensation after 7 days from the surgery.  Anesthesia may be contacted by calling the Same Day Surgery Department, Mon. through Fri., 6 am to 4 pm at [phone].   e. If you experience any other problems or concerns, please contact your surgeon's office. f. If you experience severe or prolonged shortness of breath go to the nearest emergency department.

## 2020-07-02 NOTE — Anesthesia Procedure Notes (Signed)
Anesthesia Regional Block: Interscalene brachial plexus block   Pre-Anesthetic Checklist: ,, timeout performed, Correct Patient, Correct Site, Correct Laterality, Correct Procedure, Correct Position, site marked, Risks and benefits discussed,  Surgical consent,  Pre-op evaluation,  At surgeon's request and post-op pain management  Laterality: Right  Prep: chloraprep       Needles:  Injection technique: Single-shot  Needle Type: Stimiplex     Needle Length: 10cm  Needle Gauge: 21     Additional Needles:   Procedures:,,,, ultrasound used (permanent image in chart),,,,  Narrative:  Start time: 07/02/2020 11:17 AM End time: 07/02/2020 11:23 AM Injection made incrementally with aspirations every 5 mL.  Performed by: Personally  Anesthesiologist: Veda Canning, MD  Additional Notes: Functioning IV was confirmed and monitors applied. Ultrasound guidance: relevant anatomy identified, needle position confirmed, local anesthetic spread visualized around nerve(s)., vascular puncture avoided.  Image printed for medical record.  Negative aspiration and no paresthesias; incremental administration of local anesthetic. The patient tolerated the procedure well. Vitals signes recorded in RN notes.

## 2020-07-02 NOTE — Anesthesia Procedure Notes (Signed)
Procedure Name: LMA Insertion Date/Time: 07/02/2020 1:31 PM Performed by: Cameron Ali, CRNA Pre-anesthesia Checklist: Patient identified, Emergency Drugs available, Suction available, Timeout performed and Patient being monitored Patient Re-evaluated:Patient Re-evaluated prior to induction Oxygen Delivery Method: Circle system utilized Preoxygenation: Pre-oxygenation with 100% oxygen Induction Type: IV induction LMA: LMA inserted LMA Size: 4.0 Number of attempts: 1 Placement Confirmation: positive ETCO2 and breath sounds checked- equal and bilateral Tube secured with: Tape Dental Injury: Teeth and Oropharynx as per pre-operative assessment

## 2020-07-02 NOTE — Transfer of Care (Signed)
Immediate Anesthesia Transfer of Care Note  Patient: Norman Mitchell  Procedure(s) Performed: RIGHT SHOULDER ARTHROSCOPY WITH MINI-OPEN ROTATOR CUFF REPAIR, DISTAL CLAVICLE EXCISION, BICEPS TENODESIS AND SUBACROMIAL DECOMPRESSION (Right Shoulder)  Patient Location: PACU  Anesthesia Type: General, Regional  Level of Consciousness: awake, alert  and patient cooperative  Airway and Oxygen Therapy: Patient Spontanous Breathing and Patient connected to supplemental oxygen  Post-op Assessment: Post-op Vital signs reviewed, Patient's Cardiovascular Status Stable, Respiratory Function Stable, Patent Airway and No signs of Nausea or vomiting  Post-op Vital Signs: Reviewed and stable  Complications: No complications documented.

## 2020-07-02 NOTE — Anesthesia Preprocedure Evaluation (Signed)
Anesthesia Evaluation  Patient identified by MRN, date of birth, ID band Patient awake    Reviewed: Allergy & Precautions, NPO status   Airway Mallampati: II  TM Distance: >3 FB     Dental   Pulmonary    Pulmonary exam normal        Cardiovascular hypertension, + dysrhythmias (PAF)  Rhythm:Regular Rate:Normal     Neuro/Psych    GI/Hepatic   Endo/Other  Hypothyroidism   Renal/GU      Musculoskeletal  (+) Arthritis ,   Abdominal   Peds  Hematology   Anesthesia Other Findings   Reproductive/Obstetrics                             Anesthesia Physical Anesthesia Plan  ASA: III  Anesthesia Plan: General and Regional   Post-op Pain Management:  Regional for Post-op pain   Induction: Intravenous  PONV Risk Score and Plan: 2 and Treatment may vary due to age or medical condition, Dexamethasone and Ondansetron  Airway Management Planned: LMA  Additional Equipment:   Intra-op Plan:   Post-operative Plan:   Informed Consent: I have reviewed the patients History and Physical, chart, labs and discussed the procedure including the risks, benefits and alternatives for the proposed anesthesia with the patient or authorized representative who has indicated his/her understanding and acceptance.     Dental advisory given  Plan Discussed with: CRNA  Anesthesia Plan Comments:         Anesthesia Quick Evaluation

## 2020-07-02 NOTE — H&P (Signed)
PREOPERATIVE H&P  Chief Complaint: Right Shoulder Rotator Cuff Tear  HPI: Norman Mitchell is a 80 y.o. male who presents for preoperative history and physical with a diagnosis of Right Shoulder Rotator Cuff Tear. Symptoms of pain, limitation of motion and weakness are significantly impairing activities of daily living.  He has agreed with surgical management.  MRI has confirmed a high grade partial thickness rotator cuff tear.   He has failed non-operative management.  Past Medical History:  Diagnosis Date  . Arthritis    Hands, knee  . Back pain   . Complication of anesthesia    Was told after 1 surgery that his HR went "really low".  Says his normal HR is 50-55.  Marland Kitchen DVT (deep venous thrombosis) ([place]) 2017   Bilateral knees.  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  . Hypothyroidism   . 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  . Paroxysmal atrial fibrillation ([place])   . Prostatitis   . Scoliosis   . Thrombocytopenia ([place])   . Venous insufficiency    Past Surgical History:  Procedure Laterality Date  . KNEE ARTHROSCOPY    . KNEE ARTHROSCOPY WITH MEDIAL MENISECTOMY Right 01/09/2020   Procedure: RIGHT KNEE ARTHROSCOPY WITH MEDIAL MENISECTOMY;  Surgeon: Thornton Park, MD;  Location: [place];  Service: Orthopedics;  Laterality: Right;  . Oral biopsy    . PILONIDAL CYST EXCISION    . PROSTATE BIOPSY     Negative  . SEPTOPLASTY    . TONSILLECTOMY     Social History   Socioeconomic History  . Marital status: Married    Spouse name: Not on file  . Number of children: Not on file  . Years of education: Not on file  . Highest education level: Not on file  Occupational History  . Not on file  Tobacco Use  . Smoking status: Never Smoker  . Smokeless tobacco: Never Used  Vaping Use  . Vaping Use: Never used  Substance and Sexual Activity  . Alcohol use: Never  . Drug use: Never  . Sexual activity: Not on file  Other Topics Concern  . Not on file  Social History Narrative    From [place], [place].    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   [employer] [phone] through 48.  E5.  No service related issues.   Enjoys hiking, painting, Scientist, water quality, woodworking   Lives at [facility].   Social Determinants of Health   Financial Resource Strain: Low Risk   . Difficulty of Paying Living Expenses: Not hard at all  Food Insecurity: No Food Insecurity  . Worried About Charity fundraiser in the Last Year: Never true  . Ran Out of Food in the Last Year: Never true  Transportation Needs: No Transportation Needs  . Lack of Transportation (Medical): No  . Lack of Transportation (Non-Medical): No  Physical Activity: Sufficiently Active  . Days of Exercise per Week: 7 days  . Minutes of Exercise per Session: 50 min  Stress: No Stress Concern Present  . Feeling of Stress : Not at all  Social Connections: Not on file   Family History  Problem Relation Age of Onset  . Heart disease Mother   . High Cholesterol Father   . High blood pressure Father   . Stroke Father   . Prostate cancer Paternal Grandfather        Possible diagnosis, not definite.  Marland Kitchen  Colon cancer Neg Hx    Allergies  Allergen Reactions  . Bactrim [Sulfamethoxazole-Trimethoprim] Hives  . Ciprofloxacin Other (See Comments)    Leg tendonitis   . Ibuprofen Other (See Comments)    Renal failure with high doses.   . Levofloxacin Other (See Comments)    Severe leg tendonitis   Prior to Admission medications   Medication Sig Start Date End Date Taking? Authorizing Provider  alfuzosin (UROXATRAL) 10 MG 24 hr tablet Take 1 tablet (10 mg total) by mouth daily. 05/29/20  Yes Billey Co, MD  apixaban (ELIQUIS) 5 MG TABS tablet Take 1 tablet (5 mg total) by mouth 2 (two) times daily. 01/08/20  Yes Agbor-Etang, Aaron Edelman, MD  CRANBERRY EXTRACT PO Take 500 mg by mouth 3 (three) times a week.    Yes [provider]  levothyroxine (SYNTHROID) 75  MCG tablet TAKE ONE TABLET BY MOUTH DAILY 08/21/19  Yes Tonia Ghent, MD  Lutein 20 MG CAPS Take by mouth. 08/01/15  Yes [provider]  Multiple Vitamins-Minerals (SENIOR MULTIVITAMIN PLUS PO)    Yes [provider]  NON FORMULARY Cocoavia 750 mg daily.   Yes [provider]  Omega-3 1000 MG CAPS Take by mouth. 3 times a week   Yes [provider]  triamcinolone cream (KENALOG) 0.1 % triamcinolone acetonide 0.1 % topical cream   Yes [provider]  albuterol (VENTOLIN HFA) 108 (90 Base) MCG/ACT inhaler Inhale into the lungs as needed.     [provider]     Positive ROS: All other systems have been reviewed and were otherwise negative with the exception of those mentioned in the HPI and as above.  Physical Exam: General: Alert, no acute distress Cardiovascular: Regular rate and rhythm, no murmurs rubs or gallops.  No pedal edema Respiratory: Clear to auscultation bilaterally, no wheezes rales or rhonchi. No cyanosis, no use of accessory musculature GI: No organomegaly, abdomen is soft and non-tender nondistended with positive bowel sounds. Skin: Skin intact, no lesions within the operative field. Neurologic: Sensation intact distally Psychiatric: Patient is competent for consent with normal mood and affect Lymphatic: No cervical lymphadenopathy  MUSCULOSKELETAL: Right shoulder:  Pain with abduction and forward elevation above 90 degrees.  He has pain with a downward directed force on his abducted shoulder.  He has full digital, wrist and elbow ROM.  + impingement signs.  No instability or apprehension.    Assessment: Right Shoulder Rotator Cuff Tear  Plan: Plan for Procedure(s): RIGHT SHOULDER ARTHROSCOPY WITH MINI-OPEN ROTATOR CUFF REPAIR, DISTAL CLAVICLE EXCISION, BICEPS TENODESIS AND SUBACROMIAL DECOMPRESSION  I reviewed the details of the operation and the post-op course.    I discussed the risks and benefits of surgery.  The risks include but are not limited to infection, bleeding, nerve or blood vessel injury, joint stiffness or loss of motion, persistent pain, weakness or instability, retear of the rotator cuff or biceps, failure of the repair, hardware failure and the need for further surgery. Medical risks include but are not limited to DVT and pulmonary embolism, myocardial infarction, stroke, pneumonia, respiratory failure and death. Patient and his wife understood these risks and wished to proceed.    Thornton Park, MD   07/02/2020 1:18 PM

## 2020-07-03 ENCOUNTER — Encounter: Payer: Self-pay | Admitting: Orthopedic Surgery

## 2020-07-22 ENCOUNTER — Encounter: Payer: Self-pay | Admitting: Family Medicine

## 2020-07-23 ENCOUNTER — Other Ambulatory Visit: Payer: Self-pay

## 2020-07-23 ENCOUNTER — Encounter: Payer: Self-pay | Admitting: Family Medicine

## 2020-07-23 ENCOUNTER — Ambulatory Visit (INDEPENDENT_AMBULATORY_CARE_PROVIDER_SITE_OTHER): Payer: Medicare Other | Admitting: Family Medicine

## 2020-07-23 DIAGNOSIS — R439 Unspecified disturbances of smell and taste: Secondary | ICD-10-CM | POA: Diagnosis not present

## 2020-07-23 MED ORDER — FLUTICASONE PROPIONATE 50 MCG/ACT NA SUSP: 2.0000 | Freq: Every day | NASAL | 12 refills | Status: AC

## 2020-07-23 NOTE — Progress Notes (Signed)
This visit occurred during the SARS-CoV-2 public health emergency.  Safety protocols were in place, including screening questions prior to the visit, additional usage of staff PPE, and extensive cleaning of exam room while observing appropriate contact time as indicated for disinfecting solutions.  He isn't lightheaded. R arm in sling.  He is in PT and improving his ROM.  Discussed with patient.  Just prior to surgery he noted a strange odor in his nose.  Had had really sensitive sense of smell when he had his prev radiation tx but that had normalized in the meantime.    Now sx come and go, seems to change with humidity.  Started using nasal saline in the meantime.  That may be helping some.  Less sx recently but he can still smell "a sweet medicinal smell".  No rhinorrhea.  No HA, fevers, cough.  He has some occ tingling in the nasal passages.    Sx going on at least 3 weeks.  He had neg covid test just prior to surgery.  No flonase use.  No zinc nasal spray.  Relatively normal taste now except for with sweets- it was prev altered with radiation and gradually improved.    Meds, vitals, and allergies reviewed.   ROS: Per HPI unless specifically indicated in ROS section   nad ncat TM wnl B OP wnl except for post radiation changes.   Nasal exam slightly stuffy.  Sinuses not ttp.   CN 2-12 wnl Neck supple, no LA rrr Right arm in sling.  Skin well perfused.

## 2020-07-23 NOTE — Patient Instructions (Addendum)
I would use nasal saline for now and then add on flonase if not better.  Please update me as needed.  Take care.  Glad to see you.

## 2020-07-24 ENCOUNTER — Encounter: Payer: Self-pay | Admitting: Cardiology

## 2020-07-24 ENCOUNTER — Ambulatory Visit (INDEPENDENT_AMBULATORY_CARE_PROVIDER_SITE_OTHER): Payer: Medicare Other | Admitting: Cardiology

## 2020-07-24 VITALS — BP 158/88 | HR 57 | Ht 70.0 in | Wt 172.0 lb

## 2020-07-24 DIAGNOSIS — I480 Paroxysmal atrial fibrillation: Secondary | ICD-10-CM

## 2020-07-24 DIAGNOSIS — R030 Elevated blood-pressure reading, without diagnosis of hypertension: Secondary | ICD-10-CM

## 2020-07-24 DIAGNOSIS — R439 Unspecified disturbances of smell and taste: Secondary | ICD-10-CM | POA: Insufficient documentation

## 2020-07-24 NOTE — Patient Instructions (Signed)
Medication Instructions:  Your physician recommends that you continue on your current medications as directed. Please refer to the Current Medication list given to you today. *If you need a refill on your cardiac medications before your next appointment, please call your pharmacy*  Lab Work: None ordered. If you have labs (blood work) drawn today and your tests are completely normal, you will receive your results only by: . MyChart Message (if you have MyChart) OR . A paper copy in the mail If you have any lab test that is abnormal or we need to change your treatment, we will call you to review the results.  Testing/Procedures: None ordered.  Follow-Up: At [facility], you and your health needs are our priority.  As part of our continuing mission to provide you with exceptional heart care, we have created designated Provider Care Teams.  These Care Teams include your primary Cardiologist (physician) and Advanced Practice Providers (APPs -  Physician Assistants and Nurse Practitioners) who all work together to provide you with the care you need, when you need it.  Your next appointment:   Your physician wants you to follow-up in: one year with Dr. Lambert.   You will receive a reminder letter in the mail two months in advance. If you don't receive a letter, please call our office to schedule the follow-up appointment.    

## 2020-07-24 NOTE — Progress Notes (Signed)
Electrophysiology Office Note:    Date:  07/24/2020   ID:  Norman Mitchell, DOB September 06, 1940, MRN [id number]  PCP:  Tonia Ghent, MD  [facility] Cardiologist:  Kate Sable, MD  [facility] Electrophysiologist:  Vickie Epley, MD   Referring MD: Kate Sable, MD   Chief Complaint: Paroxysmal atrial fibrillation  History of Present Illness:    Norman Mitchell is a 80 y.o. male who presents for an evaluation of paroxysmal atrial fibrillation at the request of Dr. Garen Lah. Their medical history includes DVT, hypothyroidism, thrombocytopenia and paroxysmal atrial fibrillation.  The patient last saw Dr. Garen Lah on June 14, 2020.  His diagnosis of atrial fibrillation dates back to May 2017.  The patient monitors his heart rhythm with a [facility] device.  He has very rare episodes and when they are present they last for minutes at a time.  He experiences no shortness of breath, chest pain, presyncope or syncope during the episodes.  He is taking his Eliquis without any bleeding problems.  He is currently recovering from a right rotator cuff repair.  He said his recovery is going well.  He is typically very active with his wife but after the surgery he has had to take it easy while healing.  Past Medical History:  Diagnosis Date  . Arthritis    Hands, knee  . Back pain   . Complication of anesthesia    Was told after 1 surgery that his HR went "really low".  Says his normal HR is 50-55.  Marland Kitchen DVT (deep venous thrombosis) ([place]) 2017   Bilateral knees.  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  . Hypothyroidism   . 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  . Paroxysmal atrial fibrillation ([place])   . Prostatitis   . Scoliosis   . Thrombocytopenia ([place])   . Venous insufficiency     Past Surgical History:  Procedure Laterality Date  . KNEE ARTHROSCOPY    . KNEE ARTHROSCOPY WITH MEDIAL MENISECTOMY Right 01/09/2020   Procedure: RIGHT KNEE ARTHROSCOPY WITH MEDIAL  MENISECTOMY;  Surgeon: Thornton Park, MD;  Location: [place];  Service: Orthopedics;  Laterality: Right;  . Oral biopsy    . PILONIDAL CYST EXCISION    . PROSTATE BIOPSY     Negative  . SEPTOPLASTY    . SHOULDER ARTHROSCOPY WITH ROTATOR CUFF REPAIR AND SUBACROMIAL DECOMPRESSION Right 07/02/2020   Procedure: RIGHT SHOULDER ARTHROSCOPY  DISTAL CLAVICLE EXCISION, BICEPS TENODESIS, SUBACROMIAL DECOMPRESSION, ANTERIOR LABRAL REPAIR. AND MINI OPEN ROTATOR CUFF REPAIR;  Surgeon: Thornton Park, MD;  Location: [place];  Service: Orthopedics;  Laterality: Right;  . TONSILLECTOMY      Current Medications: Current Meds  Medication Sig  . alfuzosin (UROXATRAL) 10 MG 24 hr tablet Take 1 tablet (10 mg total) by mouth daily.  Marland Kitchen apixaban (ELIQUIS) 5 MG TABS tablet Take 1 tablet (5 mg total) by mouth 2 (two) times daily.  Marland Kitchen CRANBERRY EXTRACT PO Take 500 mg by mouth 3 (three) times a week.   . fluticasone (FLONASE) 50 MCG/ACT nasal spray Place 2 sprays into both nostrils daily.  Marland Kitchen levothyroxine (SYNTHROID) 75 MCG tablet TAKE ONE TABLET BY MOUTH DAILY  . Lutein 20 MG CAPS Take by mouth.  . Multiple Vitamins-Minerals (SENIOR MULTIVITAMIN PLUS PO)   . NON FORMULARY Cocoavia 750 mg daily.  . Omega-3 1000 MG CAPS Take by mouth. 3 times a week     Allergies:   Bactrim [sulfamethoxazole-trimethoprim], Ciprofloxacin, Ibuprofen, and Levofloxacin  Social History   Socioeconomic History  . Marital status: Married    Spouse name: Not on file  . Number of children: Not on file  . Years of education: Not on file  . Highest education level: Not on file  Occupational History  . Not on file  Tobacco Use  . Smoking status: Never Smoker  . Smokeless tobacco: Never Used  Vaping Use  . Vaping Use: Never used  Substance and Sexual Activity  . Alcohol use: Never  . Drug use: Never  . Sexual activity: Not on file  Other Topics Concern  . Not on file  Social History Narrative   From  [place], [place].    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   [employer] [phone] through 45.  E5.  No service related issues.   Enjoys hiking, painting, Scientist, water quality, woodworking   Lives at [facility].   Social Determinants of Health   Financial Resource Strain: Low Risk   . Difficulty of Paying Living Expenses: Not hard at all  Food Insecurity: No Food Insecurity  . Worried About Charity fundraiser in the Last Year: Never true  . Ran Out of Food in the Last Year: Never true  Transportation Needs: No Transportation Needs  . Lack of Transportation (Medical): No  . Lack of Transportation (Non-Medical): No  Physical Activity: Sufficiently Active  . Days of Exercise per Week: 7 days  . Minutes of Exercise per Session: 50 min  Stress: No Stress Concern Present  . Feeling of Stress : Not at all  Social Connections: Not on file     Family History: The patient's family history includes Heart disease in his mother; High Cholesterol in his father; High blood pressure in his father; Prostate cancer in his paternal grandfather; Stroke in his father. There is no history of Colon cancer.  ROS:   Please see the history of present illness.    All other systems reviewed and are negative.  EKGs/Labs/Other Studies Reviewed:    The following studies were reviewed today:  April 05, 2019 echo Left ventricular function normal, 55% Right ventricular function normal Mild to moderately dilated left atrium Right atrial size was moderately dilated Mild MR Trivial TR  EKG:  The ekg ordered today demonstrates sinus rhythm with a right bundle branch block  Recent Labs: 11/10/2019: TSH 4.11 06/12/2020: ALT 14; BUN 18; Creatinine, Ser 1.22; Hemoglobin 15.2; Platelets 102.0; Potassium 4.0; Sodium 140  Recent Lipid Panel    Component Value Date/Time   CHOL 137 11/10/2019 1016   CHOL 123 11/28/2018 0000   TRIG 66.0 11/10/2019 1016   TRIG 72  11/28/2018 0000   HDL 61.60 11/10/2019 1016   CHOLHDL 2 11/10/2019 1016   VLDL 13.2 11/10/2019 1016   LDLCALC 62 11/10/2019 1016   LDLCALC 53 11/28/2018 0000    Physical Exam:    VS:  BP (!) 158/88 (BP Location: Left Arm, Patient Position: Sitting, Cuff Size: Normal)   Pulse (!) 57   Ht 5\' 10"  (1.778 m)   Wt 172 lb (78 kg)   SpO2 98%   BMI 24.68 kg/m     Wt Readings from Last 3 Encounters:  07/24/20 172 lb (78 kg)  07/23/20 174 lb (78.9 kg)  07/02/20 167 lb (75.8 kg)     GEN:  Well nourished, well developed in no acute distress.  Appears younger than stated age 38: Normal NECK: No JVD; No  carotid bruits LYMPHATICS: No lymphadenopathy CARDIAC: RRR, no murmurs, rubs, gallops RESPIRATORY:  Clear to auscultation without rales, wheezing or rhonchi  ABDOMEN: Soft, non-tender, non-distended MUSCULOSKELETAL:  No edema; No deformity.  Right arm in sling. SKIN: Warm and dry NEUROLOGIC:  Alert and oriented x 3 PSYCHIATRIC:  Normal affect   ASSESSMENT:    1. PAF (paroxysmal atrial fibrillation) ([place])   2. White coat syndrome without diagnosis of hypertension    PLAN:    In order of problems listed above:  1. Paroxysmal atrial fibrillation On Eliquis for stroke prophylaxis. Overall very low burden of A. Fib. I have asked him to continue monitoring his heart rhythm with a [facility] device. We discussed the pathophysiology of atrial fibrillation and the possibility that as he ages he will have more A. fib.  If he does experience more atrial fibrillation in the future, of asked him to reach out to my office so that we could schedule an appointment to discuss treatment options.  In the meantime he will continue taking his Eliquis. He has had a sleep study which did not show obstructive sleep apnea.  Follow-up 1 year or sooner as needed.      Medication Adjustments/Labs and Tests Ordered: Current medicines are reviewed at length with the patient today.  Concerns  regarding medicines are outlined above.  No orders of the defined types were placed in this encounter.  No orders of the defined types were placed in this encounter.    Signed, Hilton Cork. Quentin Ore, MD, [facility], [facility] 07/24/2020 2:16 PM    Electrophysiology [facility]

## 2020-07-24 NOTE — Assessment & Plan Note (Signed)
Discussed options.  No other neurologic symptoms.  Would not need imaging at this point.  He does appear to be improving some with nasal saline use.  I think it makes sense to continue this in the meantime and then use Flonase later on if needed.  If he is worsening or not improving at all then he can let me know.  We can refer to ENT if needed, but I think it makes sense to proceed as above for now.  I do not suspect an ominous diagnosis he agrees with the plan.  He will update me as needed.

## 2020-07-26 ENCOUNTER — Ambulatory Visit: Payer: Medicare Other | Admitting: Primary Care

## 2020-08-01 ENCOUNTER — Other Ambulatory Visit: Payer: Self-pay | Admitting: Family Medicine

## 2020-08-02 ENCOUNTER — Other Ambulatory Visit: Payer: Self-pay | Admitting: *Deleted

## 2020-08-02 MED ORDER — ELIQUIS 5 MG PO TABS: 5.0000 mg | ORAL_TABLET | Freq: Two times a day (BID) | ORAL | 1 refills | Status: DC

## 2020-08-02 NOTE — Telephone Encounter (Signed)
55m, 78kg, scr 1.22 06/12/20, lovw/lambert 07/24/20

## 2020-08-31 ENCOUNTER — Encounter: Payer: Self-pay | Admitting: Family Medicine

## 2020-09-04 MED ORDER — ALBUTEROL SULFATE HFA 108 (90 BASE) MCG/ACT IN AERS: 1.0000 | INHALATION_SPRAY | Freq: Four times a day (QID) | RESPIRATORY_TRACT | 3 refills | Status: DC | PRN

## 2020-09-04 NOTE — Telephone Encounter (Signed)
I printed the prescription.  Please pull it for me to sign and then place it upfront for the patient.  I sent him a note that he would be able to pick it up later this week.  Thanks.

## 2020-09-08 ENCOUNTER — Encounter: Payer: Self-pay | Admitting: Family Medicine

## 2020-09-10 ENCOUNTER — Telehealth: Payer: Self-pay

## 2020-09-10 NOTE — Telephone Encounter (Signed)
Mrs. Ok Edwards, patient's wife, called stating that patient has discussed with Dr Damita Dunnings about having wife establish with Dr Damita Dunnings for primary care and this was approved.  She is scheduled but I wanted to send note to Dr Damita Dunnings to make sure this was accurate and did not need to be changed. She does not have any issues at this time. She is scheduled in September. Thank you

## 2020-09-10 NOTE — Telephone Encounter (Signed)
Yes, thank.  Please try to get records on her in the meantime and set it up under her chart.

## 2020-09-26 ENCOUNTER — Other Ambulatory Visit: Payer: Self-pay

## 2020-09-26 ENCOUNTER — Encounter: Payer: Self-pay | Admitting: Family Medicine

## 2020-09-26 ENCOUNTER — Ambulatory Visit (INDEPENDENT_AMBULATORY_CARE_PROVIDER_SITE_OTHER): Payer: Medicare Other | Admitting: Family Medicine

## 2020-09-26 DIAGNOSIS — H6980 Other specified disorders of Eustachian tube, unspecified ear: Secondary | ICD-10-CM

## 2020-09-26 NOTE — Progress Notes (Signed)
This visit occurred during the SARS-CoV-2 public health emergency.  Safety protocols were in place, including screening questions prior to the visit, additional usage of staff PPE, and extensive cleaning of exam room while observing appropriate contact time as indicated for disinfecting solutions.  He is still going to PT for his R shoulder.  He is slowly improving.    R ear sx.  Going on for about 10 days.  No pain.  Felt something rubbing on the ear drum.  Better as of yesterday.   Meds, vitals, and allergies reviewed.   ROS: Per HPI unless specifically indicated in ROS section   Nad Ncat TM wnl B No canal erythema. Minimal wax on exam, likely noncontributory, bilaterally. Neck supple, no LA

## 2020-09-26 NOTE — Patient Instructions (Addendum)
If you have symptoms again, then try using flonase and gently try to pop your ears.   Take care.  Glad to see you.  No change for the visit.  Update me as needed.

## 2020-09-29 DIAGNOSIS — H6980 Other specified disorders of Eustachian tube, unspecified ear: Secondary | ICD-10-CM | POA: Insufficient documentation

## 2020-09-29 NOTE — Assessment & Plan Note (Signed)
Likely had eustachian tube dysfunction, improved now without symptoms currently.  Discussed using Valsalva and Flonase and he can update me as needed.  No charge for visit.

## 2020-10-27 ENCOUNTER — Other Ambulatory Visit: Payer: Self-pay | Admitting: Family Medicine

## 2020-10-27 DIAGNOSIS — D696 Thrombocytopenia, unspecified: Secondary | ICD-10-CM

## 2020-10-27 DIAGNOSIS — I480 Paroxysmal atrial fibrillation: Secondary | ICD-10-CM

## 2020-10-27 DIAGNOSIS — Z8249 Family history of ischemic heart disease and other diseases of the circulatory system: Secondary | ICD-10-CM

## 2020-10-27 DIAGNOSIS — Z125 Encounter for screening for malignant neoplasm of prostate: Secondary | ICD-10-CM

## 2020-10-27 DIAGNOSIS — E039 Hypothyroidism, unspecified: Secondary | ICD-10-CM

## 2020-11-04 ENCOUNTER — Encounter: Payer: Self-pay | Admitting: Family Medicine

## 2020-11-05 ENCOUNTER — Other Ambulatory Visit (INDEPENDENT_AMBULATORY_CARE_PROVIDER_SITE_OTHER): Payer: Medicare Other

## 2020-11-05 ENCOUNTER — Other Ambulatory Visit: Payer: Self-pay

## 2020-11-05 DIAGNOSIS — Z8249 Family history of ischemic heart disease and other diseases of the circulatory system: Secondary | ICD-10-CM

## 2020-11-05 DIAGNOSIS — D696 Thrombocytopenia, unspecified: Secondary | ICD-10-CM

## 2020-11-05 DIAGNOSIS — E039 Hypothyroidism, unspecified: Secondary | ICD-10-CM

## 2020-11-05 DIAGNOSIS — Z125 Encounter for screening for malignant neoplasm of prostate: Secondary | ICD-10-CM | POA: Diagnosis not present

## 2020-11-05 DIAGNOSIS — I480 Paroxysmal atrial fibrillation: Secondary | ICD-10-CM

## 2020-11-05 LAB — CBC WITH DIFFERENTIAL/PLATELET
Basophils Absolute: 0 10*3/uL (ref 0.0–0.1)
Basophils Relative: 0.9 % (ref 0.0–3.0)
Eosinophils Absolute: 0.1 10*3/uL (ref 0.0–0.7)
Eosinophils Relative: 2.1 % (ref 0.0–5.0)
HCT: 45.3 % (ref 39.0–52.0)
Hemoglobin: 15 g/dL (ref 13.0–17.0)
Lymphocytes Relative: 16.6 % (ref 12.0–46.0)
Lymphs Abs: 0.7 10*3/uL (ref 0.7–4.0)
MCHC: 33.1 g/dL (ref 30.0–36.0)
MCV: 89.6 fl (ref 78.0–100.0)
Monocytes Absolute: 0.5 10*3/uL (ref 0.1–1.0)
Monocytes Relative: 11.6 % (ref 3.0–12.0)
Neutro Abs: 3.1 10*3/uL (ref 1.4–7.7)
Neutrophils Relative %: 68.8 % (ref 43.0–77.0)
Platelets: 96 10*3/uL — ABNORMAL LOW (ref 150.0–400.0)
RBC: 5.06 Mil/uL (ref 4.22–5.81)
RDW: 14 % (ref 11.5–15.5)
WBC: 4.5 10*3/uL (ref 4.0–10.5)

## 2020-11-05 LAB — COMPREHENSIVE METABOLIC PANEL
ALT: 16 U/L (ref 0–53)
AST: 21 U/L (ref 0–37)
Albumin: 4.2 g/dL (ref 3.5–5.2)
Alkaline Phosphatase: 73 U/L (ref 39–117)
BUN: 25 mg/dL — ABNORMAL HIGH (ref 6–23)
CO2: 31 mEq/L (ref 19–32)
Calcium: 9 mg/dL (ref 8.4–10.5)
Chloride: 102 mEq/L (ref 96–112)
Creatinine, Ser: 1.29 mg/dL (ref 0.40–1.50)
GFR: 53.3 mL/min — ABNORMAL LOW (ref >60.00)
Glucose, Bld: 110 mg/dL — ABNORMAL HIGH (ref 70–99)
Potassium: 3.9 mEq/L (ref 3.5–5.1)
Sodium: 140 mEq/L (ref 135–145)
Total Bilirubin: 2 mg/dL — ABNORMAL HIGH (ref 0.2–1.2)
Total Protein: 6.6 g/dL (ref 6.0–8.3)

## 2020-11-05 LAB — LIPID PANEL
Cholesterol: 132 mg/dL (ref 0–200)
HDL: 55.2 mg/dL (ref >39.00)
LDL Cholesterol: 65 mg/dL (ref 0–99)
NonHDL: 77.26
Total CHOL/HDL Ratio: 2
Triglycerides: 60 mg/dL (ref 0.0–149.0)
VLDL: 12 mg/dL (ref 0.0–40.0)

## 2020-11-05 LAB — PSA, MEDICARE: PSA: 4.77 ng/ml — ABNORMAL HIGH (ref 0.10–4.00)

## 2020-11-05 LAB — TSH: TSH: 5.65 u[IU]/mL — ABNORMAL HIGH (ref 0.35–5.50)

## 2020-11-07 ENCOUNTER — Ambulatory Visit (INDEPENDENT_AMBULATORY_CARE_PROVIDER_SITE_OTHER): Payer: Medicare Other | Admitting: Family Medicine

## 2020-11-07 ENCOUNTER — Encounter: Payer: Self-pay | Admitting: Family Medicine

## 2020-11-07 ENCOUNTER — Other Ambulatory Visit: Payer: Self-pay

## 2020-11-07 VITALS — BP 162/84 | HR 52 | Temp 98.4°F | Ht 70.0 in | Wt 172.0 lb

## 2020-11-07 DIAGNOSIS — N529 Male erectile dysfunction, unspecified: Secondary | ICD-10-CM

## 2020-11-07 DIAGNOSIS — Z7189 Other specified counseling: Secondary | ICD-10-CM

## 2020-11-07 DIAGNOSIS — E039 Hypothyroidism, unspecified: Secondary | ICD-10-CM

## 2020-11-07 DIAGNOSIS — R202 Paresthesia of skin: Secondary | ICD-10-CM

## 2020-11-07 DIAGNOSIS — D696 Thrombocytopenia, unspecified: Secondary | ICD-10-CM

## 2020-11-07 DIAGNOSIS — I480 Paroxysmal atrial fibrillation: Secondary | ICD-10-CM

## 2020-11-07 DIAGNOSIS — R972 Elevated prostate specific antigen [PSA]: Secondary | ICD-10-CM

## 2020-11-07 DIAGNOSIS — Z0000 Encounter for general adult medical examination without abnormal findings: Secondary | ICD-10-CM

## 2020-11-07 DIAGNOSIS — Z85819 Personal history of malignant neoplasm of unspecified site of lip, oral cavity, and pharynx: Secondary | ICD-10-CM

## 2020-11-07 DIAGNOSIS — I872 Venous insufficiency (chronic) (peripheral): Secondary | ICD-10-CM | POA: Diagnosis not present

## 2020-11-07 MED ORDER — SILDENAFIL CITRATE 20 MG PO TABS: 20.0000 mg | ORAL_TABLET | Freq: Every day | ORAL | 12 refills | Status: DC | PRN

## 2020-11-07 NOTE — Patient Instructions (Addendum)
Goal to recheck TSH in about 3 months at a lab visit.  Take care.  Glad to see you. Don't change your meds for now.  I will update urology and ask for advice.

## 2020-11-07 NOTE — Progress Notes (Signed)
This visit occurred during the SARS-CoV-2 public health emergency.  Safety protocols were in place, including screening questions prior to the visit, additional usage of staff PPE, and extensive cleaning of exam room while observing appropriate contact time as indicated for disinfecting solutions.  His R shoulder ROM is getting better.    PSA d/w pt. Higher off finasteride, d/w pt.  Prev neg biopsy.  Urine stream is worse at night off med, with slower starting.  Daytime has less symptoms.  He can manage as is.  I will update urology about PSA and ask for input.  TSH abnormal, dw pt.  Taking 41mg a day.  TSH up, d/w pt.  No neck mass in neck.  D/w pt about options with tx.  We can recheck in about 3 months and go from there.  He doesn't have fatigue or new sx now.    H/o PAF.  Anticoagulated.  Recheck pulse 52 and regular.  H/o white coat HTN with BP controlled at home.  He has a detailed log showing controlled blood pressures at home.  No CP. Not SOB.  No heart racing.    Thrombocytopenia at baseline w/o bleeding o/w.  Discussed.  Rare use of SABA, cautions d/w pt. Changes in sense of smell resolved.    [facility]  He talked to speech path to get exercises for swallowing/speech with benefit noted by patient.  Neg exam o/w per patient report.   Flu shot to be done at [facility].  Tdap 2020 Covid booster d/w pt.  PNA up to date Shingles shot up to date.   Wife designated if patient were incapacitated. See above re: PSA Colonoscopy done at age 80  Diet and exercise d/w pt.  Doing well with both.    ED d/w pt.  D/w pt about options with sildenafil.  No NTG use.    Some occ tingling in the L foot, longstanding but intermittent.  Near the ball of the L foot.  No R foot or L hand sx.  No foot drop.  No escalation of sx.  Sx are brief, about 1 once a month.    He needed corrective prisim with his lenses and that helped.    Edema- compression stocking used on the R  shin.  He has h/o varicose veins.  He isn't at the point of needed compression stocking on the L but has trace L leg edema.   Meds, vitals, and allergies reviewed.   ROS: Per HPI unless specifically indicated in ROS section   GEN: nad, alert and oriented HEENT: ncat NECK: supple w/o LA CV: rrr.  PULM: ctab, no inc wob ABD: soft, +bs EXT: trace BLE edema SKIN: no acute rash  35 minutes were devoted to patient care in this encounter (this includes time spent reviewing the patient's file/history, interviewing and examining the patient, counseling/reviewing plan with patient).

## 2020-11-08 ENCOUNTER — Ambulatory Visit: Payer: Medicare Other

## 2020-11-10 DIAGNOSIS — R202 Paresthesia of skin: Secondary | ICD-10-CM | POA: Insufficient documentation

## 2020-11-10 DIAGNOSIS — R972 Elevated prostate specific antigen [PSA]: Secondary | ICD-10-CM | POA: Insufficient documentation

## 2020-11-10 DIAGNOSIS — N529 Male erectile dysfunction, unspecified: Secondary | ICD-10-CM | POA: Insufficient documentation

## 2020-11-10 NOTE — Assessment & Plan Note (Signed)
At baseline, no bleeding.  He will update me as needed.

## 2020-11-10 NOTE — Assessment & Plan Note (Signed)
We can recheck TSH later on.  No change in levothyroxine at this point.

## 2020-11-10 NOTE — Assessment & Plan Note (Signed)
Regional, on the left foot, near the ball of the left foot.  Happening about once a month and brief.  No symptoms now.  He will update me as needed, especially if symptoms escalate or change.

## 2020-11-10 NOTE — Assessment & Plan Note (Signed)
Previous negative biopsy with PSA increased on recent check.  I expect an increase in PSA since he is off finasteride.  He can manage his LUTS as is.   I will update urology about PSA and ask for input.  No change in medications at this point.

## 2020-11-10 NOTE — Assessment & Plan Note (Signed)
Wife designated if patient were incapacitated.  

## 2020-11-10 NOTE — Assessment & Plan Note (Signed)
H/o oral cancer and had f/u with [facility].  He talked to speech path to get exercises for swallowing/speech with benefit noted by patient.  Neg exam o/w per patient report.  He will continue with home exercises.

## 2020-11-10 NOTE — Assessment & Plan Note (Signed)
Continue compression stocking on the right leg, he does not appear to needed on the left leg at this point.  Discussed.

## 2020-11-10 NOTE — Assessment & Plan Note (Signed)
Overall doing well. Anticoagulated.  Recheck pulse 52 and regular.  H/o white coat HTN with BP controlled at home.  He has a detailed log showing controlled blood pressures at home.  No CP. Not SOB.  No heart racing.  Continue anticoagulation with Eliquis.

## 2020-11-10 NOTE — Assessment & Plan Note (Signed)
No nitroglycerin use, routine cautions given to patient.  Can use sildenafil and update me as needed.

## 2020-11-10 NOTE — Assessment & Plan Note (Signed)
Flu shot to be done at [facility].  Tdap 2020 Covid booster d/w pt.  PNA up to date Shingles shot up to date.   Wife designated if patient were incapacitated. See above re: PSA Colonoscopy done at age 80.  Diet and exercise d/w pt.  Doing well with both.

## 2020-11-13 ENCOUNTER — Ambulatory Visit: Payer: Medicare Other

## 2020-11-13 ENCOUNTER — Other Ambulatory Visit: Payer: Medicare Other

## 2020-11-13 ENCOUNTER — Telehealth: Payer: Self-pay | Admitting: Family Medicine

## 2020-11-13 NOTE — Telephone Encounter (Signed)
Please update patient about message below.  Thanks   =========  Expect PSA to increase since finasteride stopped, 4-5 is his baseline before he started finasteride. OK to resume if he would like in terms of his urinary symptoms. Keep urology follow up as scheduled   Nickolas Madrid, MD  11/12/2020

## 2020-11-14 NOTE — Telephone Encounter (Signed)
Patient notified of message below.

## 2020-11-18 ENCOUNTER — Ambulatory Visit: Payer: Medicare Other | Admitting: Family Medicine

## 2020-11-28 ENCOUNTER — Encounter: Payer: Self-pay | Admitting: Family Medicine

## 2020-12-03 ENCOUNTER — Encounter: Payer: Self-pay | Admitting: Family Medicine

## 2020-12-05 ENCOUNTER — Other Ambulatory Visit: Payer: Self-pay | Admitting: Family Medicine

## 2020-12-05 MED ORDER — LEVOTHYROXINE SODIUM 88 MCG PO TABS: 88.0000 ug | ORAL_TABLET | Freq: Every day | ORAL | 3 refills | Status: DC

## 2021-01-16 ENCOUNTER — Ambulatory Visit: Payer: Medicare Other | Admitting: Family Medicine

## 2021-01-26 ENCOUNTER — Other Ambulatory Visit: Payer: Self-pay | Admitting: Cardiology

## 2021-01-27 NOTE — Telephone Encounter (Signed)
Prescription refill request for Eliquis received. Indication: afib  Last office visit: Norman Mitchell 07/24/2020 Scr: 1.29, 11/05/2020 Age: 80 yo  Weight: 78 kg   Refill sent.

## 2021-02-06 ENCOUNTER — Other Ambulatory Visit: Payer: Self-pay

## 2021-02-06 ENCOUNTER — Other Ambulatory Visit: Payer: Medicare Other

## 2021-02-06 ENCOUNTER — Other Ambulatory Visit (INDEPENDENT_AMBULATORY_CARE_PROVIDER_SITE_OTHER): Payer: Medicare Other

## 2021-02-06 DIAGNOSIS — E039 Hypothyroidism, unspecified: Secondary | ICD-10-CM | POA: Diagnosis not present

## 2021-02-07 LAB — TSH: TSH: 3.03 u[IU]/mL (ref 0.35–5.50)

## 2021-02-10 ENCOUNTER — Encounter: Payer: Self-pay | Admitting: Family Medicine

## 2021-02-26 ENCOUNTER — Encounter: Payer: Self-pay | Admitting: Urology

## 2021-04-23 ENCOUNTER — Encounter: Payer: Self-pay | Admitting: Family Medicine

## 2021-06-09 ENCOUNTER — Encounter: Payer: Self-pay | Admitting: Family Medicine

## 2021-06-11 ENCOUNTER — Encounter: Payer: Self-pay | Admitting: Urology

## 2021-06-11 ENCOUNTER — Ambulatory Visit (INDEPENDENT_AMBULATORY_CARE_PROVIDER_SITE_OTHER): Payer: Medicare Other | Admitting: Urology

## 2021-06-11 VITALS — BP 164/80 | HR 55 | Ht 70.0 in | Wt 166.0 lb

## 2021-06-11 DIAGNOSIS — N138 Other obstructive and reflux uropathy: Secondary | ICD-10-CM

## 2021-06-11 DIAGNOSIS — N529 Male erectile dysfunction, unspecified: Secondary | ICD-10-CM

## 2021-06-11 DIAGNOSIS — Z125 Encounter for screening for malignant neoplasm of prostate: Secondary | ICD-10-CM

## 2021-06-11 DIAGNOSIS — N401 Enlarged prostate with lower urinary tract symptoms: Secondary | ICD-10-CM | POA: Diagnosis not present

## 2021-06-11 LAB — BLADDER SCAN AMB NON-IMAGING

## 2021-06-11 MED ORDER — ALFUZOSIN HCL ER 10 MG PO TB24: 10.0000 mg | ORAL_TABLET | Freq: Every day | ORAL | 3 refills | Status: DC

## 2021-06-11 NOTE — Patient Instructions (Signed)
Holmium Laser Enucleation of the Prostate (HoLEP) ? ?HoLEP is a treatment for men with benign prostatic hyperplasia (BPH). The laser surgery removed blockages of urine flow, and is done without any incisions on the body. ? ? ?  ?What is HoLEP?  ?HoLEP is a type of laser surgery used to treat obstruction (blockage) of urine flow as a result of benign prostatic hyperplasia (BPH). In men with BPH, the prostate gland is not cancerous, but has become enlarged. An enlarged prostate can result in a number of urinary tract symptoms such as weak urinary stream, difficulty in starting urination, inability to urinate, frequent urination, or getting up at night to urinate. ? ?HoLEP was developed in the 1990's as a more effective and less expensive surgical option for BPH, compared to other surgical options such as laser vaporization(PVP/greenlight laser), transurethral resection of the prostate(TURP), and open simple prostatectomy.  ? ?What happens during a HoLEP? ?? HoLEP requires general anesthesia (?asleep? throughout the procedure).  ?? An antibiotic is given to reduce the risk of infection ?? A surgical instrument called a resectoscope is inserted through the urethra (the tube that carries urine from the bladder). The resectoscope has a camera that allows the surgeon to view the internal structure of the prostate gland, and to see where the incisions are being made during surgery. ?? The laser is inserted into the resectoscope and is used to enucleate (free up) the enlarged prostate tissue from the capsule (outer shell) and then to seal up any blood vessels. The tissue that has been removed is pushed back into the bladder. ?? A morcellator is placed through the resectoscope, and is used to suction out the prostate tissue that has been pushed into the bladder. ?? When the prostate tissue has been removed, the resectoscope is removed, and a foley catheter is placed to allow healing and drain the urine from the  bladder. ?  ? ? ?What happens after a HoLEP? ?? More than 90% of patients go home the same day a few hours after surgery. Less than 10% will be admitted to the hospital overnight for observation to monitor the urine, or if they have other medical problems. ?? Fluid is flushed through the catheter for about 1 hour after surgery to clear any blood from the urine. It is normal to have some blood in the urine after surgery. The need for blood transfusion is extremely rare. ?? Eating and drinking are permitted after the procedure once the patient has fully awakened from anesthesia. ?? The catheter is usually removed 2-3 days after surgery- the patient will come to clinic to have the catheter removed and make sure they can urinate on their own. ?? It is very important to drink lots of fluids after surgery for one week to keep the bladder flushed. ?? At first, there may be some burning with urination, but this typically improved within a few hours to days. Most patients do not have a significant amount of pain, and narcotic pain medications are rarely needed. ?? Symptoms of urinary frequency, urgency, and even leakage are NORMAL for the first few weeks after surgery as the bladder adjusts after having to work hard against blockage from the prostate for many years. This will improve, but can sometimes take several months. ?? The use of pelvic floor exercises (Kegel exercises) can help improve problems with urinary incontinence.  ?? After catheter removal, patients will be seen at 6 weeks and 6 months for symptom check ?? No heavy lifting for  at least 2-3 weeks after surgery, however patients can walk and do light activities the first day after surgery. Return to work time depends on occupation. ? ? ? ?What are the advantages of HoLEP? ?? HoLEP has been studied in many different parts of the world and has been shown to be a safe and effective procedure. Although there are many types of BPH surgeries available, HoLEP offers a  unique advantage in being able to remove a large amount of tissue without any incisions on the body, even in very large prostates, while decreasing the risk of bleeding and providing tissue for pathology (to look for cancer). This decreases the need for blood transfusions during surgery, minimizes hospital stay, and reduces the risk of needing repeat treatment. ? ?What are the side effects of HoLEP? ?? Temporary burning and bleeding during urination. Some blood may be seen in the urine for weeks after surgery and is part of the healing process. ?? Urinary incontinence (inability to control urine flow) is expected in all patients immediately after surgery and they should wear pads for the first few days/weeks. This typically improves over the course of several weeks. Performing Kegel exercises can help decrease leakage from stress maneuvers such as coughing, sneezing, or lifting. The rate of long term leakage is very low. Patients may also have leakage with urgency and this may be treated with medication. The risk of urge incontinence can be dependent on several factors including age, prostate size, symptoms, and other medical problems. ?? Retrograde ejaculation or ?backwards ejaculation.? In 75% of cases, the patient will not see any fluid during ejaculation after surgery. ?? Erectile function is generally not significantly affected.  ? ?What are the risks of HoLEP? ?? Injury to the urethra or development of scar tissue at a later date ?? Injury to the capsule of the prostate (typically treated with longer catheterization). ?? Injury to the bladder or ureteral orifices (where the urine from the kidney drains out) ?? Infection of the bladder, testes, or kidneys ?? Return of urinary obstruction at a later date requiring another operation (<2%) ?? Need for blood transfusion or re-operation due to bleeding ?? Failure to relieve all symptoms and/or need for prolonged catheterization after surgery ?? 5-15% of patients are  found to have previously undiagnosed prostate cancer in their specimen. Prostate cancer can be treated after HoLEP. ?? Standard risks of anesthesia including blood clots, heart attacks, etc ? ?When should I call my doctor? ?? Fever over 101.3 degrees ?? Inability to urinate, or large blood clots in the urine  ? ?Prostatic Urethral Lift ?Prostatic urethral lift is a surgical procedure to treat symptoms of prostate gland enlargement that occurs with age (benign prostatic hypertrophy, BPH). ?The urethra passes between the two lobes of the prostate. The urethra is the part of the body that drains urine from the bladder. As the prostate enlarges, it can push on the urethra and cause problems with urinating. This procedure involves placing an implant that holds the prostate away from the urethra. The procedure is done using a thin device called a cystoscope. The device is inserted through the tip of the penis and moved up the urethra to the prostate. This is less invasive than other procedures that require an incision. ?You may have this procedure if: ?You have symptoms of BPH. ?Your prostate is not severely enlarged. ?Medicines to treat BPH are not working or not tolerated. ?You want to avoid possible sexual side effects from medicines or other procedures that are  used to treat BPH. ?Tell a health care provider about: ?Any allergies you have. ?All medicines you are taking, including vitamins, herbs, eye drops, creams, and over-the-counter medicines. ?Any problems you or family members have had with anesthetic medicines. ?Any bleeding problems you have. ?Any surgeries you have had. ?Any medical conditions you have. ?What are the risks? ?Generally, this is a safe procedure. However, problems may occur, including: ?Bleeding. ?Infection. ?Leaking of urine (incontinence). ?Allergic reactions to medicines. ?Return of BPH symptoms after 2 years, requiring more treatment. ?What happens before the procedure? ?When to stop eating  and drinking ?Follow instructions from your health care provider about what you may eat and drink before your procedure. These may include: ?8 hours before your procedure ?Stop eating most foods. Do not eat meat,

## 2021-06-11 NOTE — Progress Notes (Signed)
? ?  06/11/2021 ?9:28 AM  ? ?Norman Mitchell ?[birth year] ?[id number] ? ?Reason for visit: Follow up BPH, ED, nocturia, PSA screening ? ?HPI: ?Healthy 80 year old male who I have followed for the above issues.  He has a long history of mildly elevated PSA of 3-4 through 2015 when he started finasteride, then had an appropriate decrease in PSA to about 1.8 which had been stable.  His prostate MRI in 2016 was negative for malignancy and a prostate volume was 50 g.  At our last visit we had opted to discontinue PSA screening per the AUA guidelines, and he also had discontinued the finasteride. ? ?His PCP is continue to check PSA, and was 4.7 in September 2022 as expected after stopping the finasteride.  Reassurance was provided that this is within the normal range for his age, and we reviewed the AUA guidelines that do not recommend routine screening in men over age 41. ? ?In terms of his urinary symptoms he does report some weak urinary stream in the morning and some urinary frequency in the morning, but this improves throughout the day.  IPSS score today is 15, with quality of life mostly satisfied, and PVR is normal at 50 mL.  He has nocturia 4 times per night, and reports lower extremity edema in the evening.  We discussed behavioral strategies regarding nocturia.  He would like to continue the alfuzosin.  We discussed other options like resuming finasteride or considering cystoscopy and TRUS for outlet procedure, but he would like to hold off at this time.  We did briefly discussed differences between UroLift and HOLEP as outlet procedure options, and he would like to do some more research. ? ?In terms of his ED, he is currently taking 80 mg sildenafil with good results. ? ?Alfuzosin refilled ?RTC 1 year PVR, sooner if worsening urinary symptoms and opts for cystoscopy/TRUS ? ? ?Billey Co, MD ? ?[facility] ?[address], [address] ?[place], [place] [address] ?[phone] ? ? ?

## 2021-06-12 ENCOUNTER — Ambulatory Visit: Payer: Medicare Other | Admitting: Urology

## 2021-06-15 ENCOUNTER — Other Ambulatory Visit: Payer: Self-pay | Admitting: Family Medicine

## 2021-06-15 DIAGNOSIS — Z85819 Personal history of malignant neoplasm of unspecified site of lip, oral cavity, and pharynx: Secondary | ICD-10-CM

## 2021-06-16 ENCOUNTER — Ambulatory Visit (INDEPENDENT_AMBULATORY_CARE_PROVIDER_SITE_OTHER)
Admission: RE | Admit: 2021-06-16 | Discharge: 2021-06-16 | Disposition: A | Discharge status: Home / Self Care | Payer: Medicare Other | Source: Ambulatory Visit | Attending: Family Medicine | Admitting: Family Medicine

## 2021-06-16 DIAGNOSIS — Z85819 Personal history of malignant neoplasm of unspecified site of lip, oral cavity, and pharynx: Secondary | ICD-10-CM

## 2021-07-03 ENCOUNTER — Ambulatory Visit (INDEPENDENT_AMBULATORY_CARE_PROVIDER_SITE_OTHER): Payer: Medicare Other

## 2021-07-03 DIAGNOSIS — I6523 Occlusion and stenosis of bilateral carotid arteries: Secondary | ICD-10-CM | POA: Diagnosis not present

## 2021-07-03 DIAGNOSIS — Z85819 Personal history of malignant neoplasm of unspecified site of lip, oral cavity, and pharynx: Secondary | ICD-10-CM

## 2021-07-29 NOTE — Progress Notes (Unsigned)
Electrophysiology Office Follow up Visit Note:    Date:  07/30/2021   ID:  Norman Mitchell, DOB [birth year], MRN [id number]  PCP:  Tonia Ghent, MD  [facility] Cardiologist:  Kate Sable, MD  [facility] Electrophysiologist:  Vickie Epley, MD    Interval History:    Norman Mitchell is a 81 y.o. male who presents for a follow up visit. They were last seen in clinic 07/24/2020 for his pAF. He takes eliquis for stroke ppx.  He has been doing well since I last saw him.  In the past his episodes of atrial fibrillation were symptomatic.  He tells me since I last saw him he has not had an episode of any arrhythmia.       Past Medical History:  Diagnosis Date   Arthritis    Hands, knee   Back pain    Complication of anesthesia    Was told after 1 surgery that his HR went "really low".  Says his normal HR is 50-55.   DVT (deep venous thrombosis) ([facility]) 2017   Bilateral knees.  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   Hypothyroidism    Oral cancer ([facility])    Status post radiation.   Paroxysmal atrial fibrillation (HCC)    Prostatitis    Scoliosis    Thrombocytopenia (HCC)    Venous insufficiency     Past Surgical History:  Procedure Laterality Date   KNEE ARTHROSCOPY     KNEE ARTHROSCOPY WITH MEDIAL MENISECTOMY Right 01/09/2020   Procedure: RIGHT KNEE ARTHROSCOPY WITH MEDIAL MENISECTOMY;  Surgeon: Thornton Park, MD;  Location: [facility];  Service: Orthopedics;  Laterality: Right;   Oral biopsy     PILONIDAL CYST EXCISION     PROSTATE BIOPSY     Negative   SEPTOPLASTY     SHOULDER ARTHROSCOPY WITH ROTATOR CUFF REPAIR AND SUBACROMIAL DECOMPRESSION Right 07/02/2020   Procedure: RIGHT SHOULDER ARTHROSCOPY  DISTAL CLAVICLE EXCISION, BICEPS TENODESIS, SUBACROMIAL DECOMPRESSION, ANTERIOR LABRAL REPAIR. AND MINI OPEN ROTATOR CUFF REPAIR;  Surgeon: Thornton Park, MD;  Location: [facility];  Service: Orthopedics;  Laterality: Right;   TONSILLECTOMY       Current Medications: Current Meds  Medication Sig   albuterol (VENTOLIN HFA) 108 (90 Base) MCG/ACT inhaler Inhale 1-2 puffs into the lungs every 6 (six) hours as needed.   alfuzosin (UROXATRAL) 10 MG 24 hr tablet Take 1 tablet (10 mg total) by mouth daily.   aspirin EC 81 MG tablet Take 1 tablet (81 mg total) by mouth daily. Swallow whole.   CRANBERRY EXTRACT PO Take 500 mg by mouth 3 (three) times a week.    fluticasone (FLONASE) 50 MCG/ACT nasal spray Place 2 sprays into both nostrils daily.   levothyroxine (SYNTHROID) 88 MCG tablet Take 1 tablet (88 mcg total) by mouth daily.   Lutein 20 MG CAPS Take by mouth.   Multiple Vitamins-Minerals (SENIOR MULTIVITAMIN PLUS PO)    NON FORMULARY Cocoavia 750 mg daily.   Omega-3 1000 MG CAPS Take by mouth. 3 times a week   sildenafil (REVATIO) 20 MG tablet Take 1-5 tablets (20-100 mg total) by mouth daily as needed.   [DISCONTINUED] ELIQUIS 5 MG TABS tablet TAKE ONE TABLET BY MOUTH TWICE A DAY     Allergies:   Bactrim [sulfamethoxazole-trimethoprim], Ciprofloxacin, Ibuprofen, and Levofloxacin   Social History   Socioeconomic History   Marital status: Married    Spouse name: Not on file   Number of children: Not on file  Years of education: Not on file   Highest education level: Not on file  Occupational History   Not on file  Tobacco Use   Smoking status: Never    Passive exposure: Never   Smokeless tobacco: Never  Vaping Use   Vaping Use: Never used  Substance and Sexual Activity   Alcohol use: Never   Drug use: Never   Sexual activity: Not Currently  Other Topics Concern   Not on file  Social History Narrative   From [place], [place].    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   [employer] [phone] through 19.  E5.  No service related issues.   Enjoys hiking, painting, Scientist, water quality, woodworking   Lives at [facility].   Social Determinants of Health   Financial  Resource Strain: Not on file  Food Insecurity: Not on file  Transportation Needs: Not on file  Physical Activity: Not on file  Stress: Not on file  Social Connections: Not on file     Family History: The patient's family history includes Heart disease in his mother; High Cholesterol in his father; High blood pressure in his father; Prostate cancer in his paternal grandfather; Stroke in his father. There is no history of Colon cancer.  ROS:   Please see the history of present illness.    All other systems reviewed and are negative.  EKGs/Labs/Other Studies Reviewed:    The following studies were reviewed today:   EKG:  The ekg ordered today demonstrates sinus rhythm.  Right bundle branch block.  Recent Labs: 11/05/2020: ALT 16; BUN 25; Creatinine, Ser 1.29; Hemoglobin 15.0; Platelets 96.0; Potassium 3.9; Sodium 140 02/06/2021: TSH 3.03  Recent Lipid Panel    Component Value Date/Time   CHOL 132 11/05/2020 0843   CHOL 123 11/28/2018 0000   TRIG 60.0 11/05/2020 0843   TRIG 72 11/28/2018 0000   HDL 55.20 11/05/2020 0843   CHOLHDL 2 11/05/2020 0843   VLDL 12.0 11/05/2020 0843   LDLCALC 65 11/05/2020 0843   LDLCALC 53 11/28/2018 0000    Physical Exam:    VS:  BP 134/70   Pulse 62   Ht '5\' 10"'$  (1.778 m)   Wt 175 lb (79.4 kg)   SpO2 100%   BMI 25.11 kg/m     Wt Readings from Last 3 Encounters:  07/30/21 175 lb (79.4 kg)  06/11/21 166 lb (75.3 kg)  11/07/20 172 lb (78 kg)     GEN:  Well nourished, well developed in no acute distress HEENT: Normal NECK: No JVD; No carotid bruits LYMPHATICS: No lymphadenopathy CARDIAC: RRR, no murmurs, rubs, gallops RESPIRATORY:  Clear to auscultation without rales, wheezing or rhonchi  ABDOMEN: Soft, non-tender, non-distended MUSCULOSKELETAL:  No edema; No deformity  SKIN: Warm and dry NEUROLOGIC:  Alert and oriented x 3 PSYCHIATRIC:  Normal affect        ASSESSMENT:    1. PAF (paroxysmal atrial fibrillation) (HCC)   2.  Palpitations   3. Hypothyroidism, unspecified type    PLAN:    In order of problems listed above:  #Paroxysmal atrial fibrillation Remote episode.  He thinks it may have been in the setting of alcohol use.  He has not had a episode of atrial fibrillation in greater than 1 year.  We have a long discussion during today's appointment about the pros and cons of continued use of systemic anticoagulation.  After discussion we elected to stop the Eliquis and  start aspirin 81 mg by mouth once daily.  He will continue monitoring his heart rhythm 1-2 times per week using his [facility] device.  If he has an episode of atrial fibrillation he is to restart his Eliquis and give [place] a call.   #Hypothyroidism On Synthroid.  Last TSH within normal limits in December 2022.  Follows with primary care.  Follow-up 1 year or sooner as needed.  APP appointment okay.   Medication Adjustments/Labs and Tests Ordered: Current medicines are reviewed at length with the patient today.  Concerns regarding medicines are outlined above.  Orders Placed This Encounter  Procedures   EKG 12-Lead   Meds ordered this encounter  Medications   aspirin EC 81 MG tablet    Sig: Take 1 tablet (81 mg total) by mouth daily. Swallow whole.    Dispense:  90 tablet    Refill:  3     Signed, Lars Mage, MD, [facility], [facility] 07/30/2021 10:03 AM    Electrophysiology [facility]

## 2021-07-30 ENCOUNTER — Ambulatory Visit (INDEPENDENT_AMBULATORY_CARE_PROVIDER_SITE_OTHER): Payer: Medicare Other | Admitting: Cardiology

## 2021-07-30 ENCOUNTER — Encounter: Payer: Self-pay | Admitting: Cardiology

## 2021-07-30 ENCOUNTER — Other Ambulatory Visit: Payer: Self-pay | Admitting: Cardiology

## 2021-07-30 VITALS — BP 134/70 | HR 62 | Ht 70.0 in | Wt 175.0 lb

## 2021-07-30 DIAGNOSIS — E039 Hypothyroidism, unspecified: Secondary | ICD-10-CM

## 2021-07-30 DIAGNOSIS — R002 Palpitations: Secondary | ICD-10-CM | POA: Diagnosis not present

## 2021-07-30 DIAGNOSIS — I480 Paroxysmal atrial fibrillation: Secondary | ICD-10-CM

## 2021-07-30 MED ORDER — ASPIRIN 81 MG PO TBEC: 81.0000 mg | DELAYED_RELEASE_TABLET | Freq: Every day | ORAL | 3 refills | Status: DC

## 2021-07-30 NOTE — Telephone Encounter (Signed)
Prescription refill request for Eliquis received. Indication: PAF Last office visit: 07/24/20  Grayce Sessions MD  Has appt 07/30/21 Scr: 1.29 on 11/05/20 Age: 81 Weight: 78kg  Based on above findings Eliquis '5mg'$  twice daily is the appropriate dose.  Refill approved.

## 2021-07-30 NOTE — Patient Instructions (Signed)
Medications: Stop Eliquis Start Aspirin 81 mg daily Your physician recommends that you continue on your current medications as directed. Please refer to the Current Medication list given to you today. *If you need a refill on your cardiac medications before your next appointment, please call your pharmacy*  Lab Work: None. If you have labs (blood work) drawn today and your tests are completely normal, you will receive your results only by: [place] (if you have MyChart) OR A paper copy in the mail If you have any lab test that is abnormal or we need to change your treatment, we will call you to review the results.  Testing/Procedures: None.  Follow-Up: At [facility], you and your health needs are our priority.  As part of our continuing mission to provide you with exceptional heart care, we have created designated Provider Care Teams.  These Care Teams include your primary Cardiologist (physician) and Advanced Practice Providers (APPs -  Physician Assistants and Nurse Practitioners) who all work together to provide you with the care you need, when you need it.  Your physician wants you to follow-up in: 12 months with one of the following Advanced Practice Providers on your designated Care Team:    Ignacia Bayley, NP Christell Faith PA Cadence Kathlen Mody PA    You will receive a reminder letter in the mail two months in advance. If you don't receive a letter, please call our office to schedule the follow-up appointment.  We recommend signing up for the patient portal called "MyChart".  Sign up information is provided on this After Visit Summary.  MyChart is used to connect with patients for Virtual Visits (Telemedicine).  Patients are able to view lab/test results, encounter notes, upcoming appointments, etc.  Non-urgent messages can be sent to your provider as well.   To learn more about what you can do with MyChart, go to [email or web address].    Any Other Special Instructions Will Be  Listed Below (If Applicable).

## 2021-07-31 ENCOUNTER — Encounter: Payer: Self-pay | Admitting: Family Medicine

## 2021-09-01 ENCOUNTER — Encounter: Payer: Self-pay | Admitting: Family Medicine

## 2021-09-12 ENCOUNTER — Encounter: Payer: Self-pay | Admitting: Family Medicine

## 2021-10-22 ENCOUNTER — Other Ambulatory Visit: Payer: Self-pay | Admitting: Family Medicine

## 2021-10-22 DIAGNOSIS — Z125 Encounter for screening for malignant neoplasm of prostate: Secondary | ICD-10-CM

## 2021-10-22 DIAGNOSIS — D696 Thrombocytopenia, unspecified: Secondary | ICD-10-CM

## 2021-10-22 DIAGNOSIS — E039 Hypothyroidism, unspecified: Secondary | ICD-10-CM

## 2021-10-22 DIAGNOSIS — Z8249 Family history of ischemic heart disease and other diseases of the circulatory system: Secondary | ICD-10-CM

## 2021-10-30 ENCOUNTER — Other Ambulatory Visit (INDEPENDENT_AMBULATORY_CARE_PROVIDER_SITE_OTHER): Payer: Medicare Other

## 2021-10-30 DIAGNOSIS — E039 Hypothyroidism, unspecified: Secondary | ICD-10-CM

## 2021-10-30 DIAGNOSIS — D696 Thrombocytopenia, unspecified: Secondary | ICD-10-CM

## 2021-10-30 DIAGNOSIS — Z125 Encounter for screening for malignant neoplasm of prostate: Secondary | ICD-10-CM

## 2021-10-30 DIAGNOSIS — Z8249 Family history of ischemic heart disease and other diseases of the circulatory system: Secondary | ICD-10-CM

## 2021-10-30 LAB — COMPREHENSIVE METABOLIC PANEL
ALT: 15 U/L (ref 0–53)
AST: 20 U/L (ref 0–37)
Albumin: 4.3 g/dL (ref 3.5–5.2)
Alkaline Phosphatase: 74 U/L (ref 39–117)
BUN: 23 mg/dL (ref 6–23)
CO2: 30 mEq/L (ref 19–32)
Calcium: 9.1 mg/dL (ref 8.4–10.5)
Chloride: 103 mEq/L (ref 96–112)
Creatinine, Ser: 1.17 mg/dL (ref 0.40–1.50)
GFR: 59.52 mL/min — ABNORMAL LOW (ref >60.00)
Glucose, Bld: 116 mg/dL — ABNORMAL HIGH (ref 70–99)
Potassium: 3.9 mEq/L (ref 3.5–5.1)
Sodium: 139 mEq/L (ref 135–145)
Total Bilirubin: 2 mg/dL — ABNORMAL HIGH (ref 0.2–1.2)
Total Protein: 6.7 g/dL (ref 6.0–8.3)

## 2021-10-30 LAB — CBC WITH DIFFERENTIAL/PLATELET
Basophils Absolute: 0 10*3/uL (ref 0.0–0.1)
Basophils Relative: 0.8 % (ref 0.0–3.0)
Eosinophils Absolute: 0.1 10*3/uL (ref 0.0–0.7)
Eosinophils Relative: 2.7 % (ref 0.0–5.0)
HCT: 45.2 % (ref 39.0–52.0)
Hemoglobin: 15 g/dL (ref 13.0–17.0)
Lymphocytes Relative: 17.1 % (ref 12.0–46.0)
Lymphs Abs: 0.8 10*3/uL (ref 0.7–4.0)
MCHC: 33.1 g/dL (ref 30.0–36.0)
MCV: 90.8 fl (ref 78.0–100.0)
Monocytes Absolute: 0.5 10*3/uL (ref 0.1–1.0)
Monocytes Relative: 11 % (ref 3.0–12.0)
Neutro Abs: 3.3 10*3/uL (ref 1.4–7.7)
Neutrophils Relative %: 68.4 % (ref 43.0–77.0)
Platelets: 100 10*3/uL — ABNORMAL LOW (ref 150.0–400.0)
RBC: 4.98 Mil/uL (ref 4.22–5.81)
RDW: 13.8 % (ref 11.5–15.5)
WBC: 4.8 10*3/uL (ref 4.0–10.5)

## 2021-10-30 LAB — LIPID PANEL
Cholesterol: 139 mg/dL (ref 0–200)
HDL: 55.9 mg/dL (ref >39.00)
LDL Cholesterol: 70 mg/dL (ref 0–99)
NonHDL: 82.69
Total CHOL/HDL Ratio: 2
Triglycerides: 65 mg/dL (ref 0.0–149.0)
VLDL: 13 mg/dL (ref 0.0–40.0)

## 2021-10-30 LAB — PSA, MEDICARE: PSA: 3.79 ng/ml (ref 0.10–4.00)

## 2021-10-30 LAB — TSH: TSH: 2.05 u[IU]/mL (ref 0.35–5.50)

## 2021-11-05 ENCOUNTER — Ambulatory Visit (INDEPENDENT_AMBULATORY_CARE_PROVIDER_SITE_OTHER): Payer: Medicare Other

## 2021-11-05 VITALS — Wt 175.0 lb

## 2021-11-05 DIAGNOSIS — Z0000 Encounter for general adult medical examination without abnormal findings: Secondary | ICD-10-CM | POA: Diagnosis not present

## 2021-11-05 DIAGNOSIS — R030 Elevated blood-pressure reading, without diagnosis of hypertension: Secondary | ICD-10-CM | POA: Insufficient documentation

## 2021-11-05 DIAGNOSIS — I872 Venous insufficiency (chronic) (peripheral): Secondary | ICD-10-CM | POA: Insufficient documentation

## 2021-11-05 DIAGNOSIS — I499 Cardiac arrhythmia, unspecified: Secondary | ICD-10-CM | POA: Insufficient documentation

## 2021-11-05 DIAGNOSIS — C029 Malignant neoplasm of tongue, unspecified: Secondary | ICD-10-CM | POA: Insufficient documentation

## 2021-11-05 NOTE — Patient Instructions (Signed)
Norman Mitchell , Thank you for taking time to come for your Medicare Wellness Visit. I appreciate your ongoing commitment to your health goals. Please review the following plan we discussed and let me know if I can assist you in the future.   Screening recommendations/referrals: Colonoscopy: aged out Recommended yearly ophthalmology/optometry visit for glaucoma screening and checkup Recommended yearly dental visit for hygiene and checkup  Vaccinations: Influenza vaccine: 12/12/20 Pneumococcal vaccine: 10/12/13 Tdap vaccine: 08/08/18 Shingles vaccine: Shingrix 12/07/17, 03/14/18   Covid-19: 03/16/19, 04/13/19, 01/16/20, 07/18/20, 11/22/20, 07/29/21  Advanced directives: yes  Conditions/risks identified: none  Next appointment: Follow up in one year for your annual wellness visit. 11/09/22 @ 2:15 pm by phone  Preventive Care 65 Years and Older, Male Preventive care refers to lifestyle choices and visits with your health care provider that can promote health and wellness. What does preventive care include? A yearly physical exam. This is also called an annual well check. Dental exams once or twice a year. Routine eye exams. Ask your health care provider how often you should have your eyes checked. Personal lifestyle choices, including: Daily care of your teeth and gums. Regular physical activity. Eating a healthy diet. Avoiding tobacco and drug use. Limiting alcohol use. Practicing safe sex. Taking low doses of aspirin every day. Taking vitamin and mineral supplements as recommended by your health care provider. What happens during an annual well check? The services and screenings done by your health care provider during your annual well check will depend on your age, overall health, lifestyle risk factors, and family history of disease. Counseling  Your health care provider may ask you questions about your: Alcohol use. Tobacco use. Drug use. Emotional well-being. Home and relationship  well-being. Sexual activity. Eating habits. History of falls. Memory and ability to understand (cognition). Work and work Statistician. Screening  You may have the following tests or measurements: Height, weight, and BMI. Blood pressure. Lipid and cholesterol levels. These may be checked every 5 years, or more frequently if you are over 71 years old. Skin check. Lung cancer screening. You may have this screening every year starting at age 32 if you have a 30-pack-year history of smoking and currently smoke or have quit within the past 15 years. Fecal occult blood test (FOBT) of the stool. You may have this test every year starting at age 7. Flexible sigmoidoscopy or colonoscopy. You may have a sigmoidoscopy every 5 years or a colonoscopy every 10 years starting at age 51. Prostate cancer screening. Recommendations will vary depending on your family history and other risks. Hepatitis C blood test. Hepatitis B blood test. Sexually transmitted disease (STD) testing. Diabetes screening. This is done by checking your blood sugar (glucose) after you have not eaten for a while (fasting). You may have this done every 1-3 years. Abdominal aortic aneurysm (AAA) screening. You may need this if you are a current or former smoker. Osteoporosis. You may be screened starting at age 59 if you are at high risk. Talk with your health care provider about your test results, treatment options, and if necessary, the need for more tests. Vaccines  Your health care provider may recommend certain vaccines, such as: Influenza vaccine. This is recommended every year. Tetanus, diphtheria, and acellular pertussis (Tdap, Td) vaccine. You may need a Td booster every 10 years. Zoster vaccine. You may need this after age 44. Pneumococcal 13-valent conjugate (PCV13) vaccine. One dose is recommended after age 6. Pneumococcal polysaccharide (PPSV23) vaccine. One dose is recommended after age  59. Talk to your health care  provider about which screenings and vaccines you need and how often you need them. This information is not intended to replace advice given to you by your health care provider. Make sure you discuss any questions you have with your health care provider. Document Released: 03/15/2015 Document Revised: 11/06/2015 Document Reviewed: 12/18/2014 Elsevier Interactive Patient Education  2017 [place] Prevention in the Home Falls can cause injuries. They can happen to people of all ages. There are many things you can do to make your home safe and to help prevent falls. What can I do on the outside of my home? Regularly fix the edges of walkways and driveways and fix any cracks. Remove anything that might make you trip as you walk through a door, such as a raised step or threshold. Trim any bushes or trees on the path to your home. Use bright outdoor lighting. Clear any walking paths of anything that might make someone trip, such as rocks or tools. Regularly check to see if handrails are loose or broken. Make sure that both sides of any steps have handrails. Any raised decks and porches should have guardrails on the edges. Have any leaves, snow, or ice cleared regularly. Use sand or salt on walking paths during winter. Clean up any spills in your garage right away. This includes oil or grease spills. What can I do in the bathroom? Use night lights. Install grab bars by the toilet and in the tub and shower. Do not use towel bars as grab bars. Use non-skid mats or decals in the tub or shower. If you need to sit down in the shower, use a plastic, non-slip stool. Keep the floor dry. Clean up any water that spills on the floor as soon as it happens. Remove soap buildup in the tub or shower regularly. Attach bath mats securely with double-sided non-slip rug tape. Do not have throw rugs and other things on the floor that can make you trip. What can I do in the bedroom? Use night lights. Make  sure that you have a light by your bed that is easy to reach. Do not use any sheets or blankets that are too big for your bed. They should not hang down onto the floor. Have a firm chair that has side arms. You can use this for support while you get dressed. Do not have throw rugs and other things on the floor that can make you trip. What can I do in the kitchen? Clean up any spills right away. Avoid walking on wet floors. Keep items that you use a lot in easy-to-reach places. If you need to reach something above you, use a strong step stool that has a grab bar. Keep electrical cords out of the way. Do not use floor polish or wax that makes floors slippery. If you must use wax, use non-skid floor wax. Do not have throw rugs and other things on the floor that can make you trip. What can I do with my stairs? Do not leave any items on the stairs. Make sure that there are handrails on both sides of the stairs and use them. Fix handrails that are broken or loose. Make sure that handrails are as long as the stairways. Check any carpeting to make sure that it is firmly attached to the stairs. Fix any carpet that is loose or worn. Avoid having throw rugs at the top or bottom of the stairs. If you do have  throw rugs, attach them to the floor with carpet tape. Make sure that you have a light switch at the top of the stairs and the bottom of the stairs. If you do not have them, ask someone to add them for you. What else can I do to help prevent falls? Wear shoes that: Do not have high heels. Have rubber bottoms. Are comfortable and fit you well. Are closed at the toe. Do not wear sandals. If you use a stepladder: Make sure that it is fully opened. Do not climb a closed stepladder. Make sure that both sides of the stepladder are locked into place. Ask someone to hold it for you, if possible. Clearly mark and make sure that you can see: Any grab bars or handrails. First and last steps. Where the  edge of each step is. Use tools that help you move around (mobility aids) if they are needed. These include: Canes. Walkers. Scooters. Crutches. Turn on the lights when you go into a dark area. Replace any light bulbs as soon as they burn out. Set up your furniture so you have a clear path. Avoid moving your furniture around. If any of your floors are uneven, fix them. If there are any pets around you, be aware of where they are. Review your medicines with your doctor. Some medicines can make you feel dizzy. This can increase your chance of falling. Ask your doctor what other things that you can do to help prevent falls. This information is not intended to replace advice given to you by your health care provider. Make sure you discuss any questions you have with your health care provider. Document Released: 12/13/2008 Document Revised: 07/25/2015 Document Reviewed: 03/23/2014 Elsevier Interactive Patient Education  2017 [employer].

## 2021-11-05 NOTE — Progress Notes (Signed)
Virtual Visit via Telephone Note  I connected with  Norman Mitchell on 11/05/21 at  8:45 AM EDT by telephone and verified that I am speaking with the correct person using two identifiers.  Location: Patient: home Provider: [place] Persons participating in the virtual visit: [place]   I discussed the limitations, risks, security and privacy concerns of performing an evaluation and management service by telephone and the availability of in person appointments. The patient expressed understanding and agreed to proceed.  Interactive audio and video telecommunications were attempted between this nurse and patient, however failed, due to patient having technical difficulties OR patient did not have access to video capability.  We continued and completed visit with audio only.  Some vital signs may be absent or patient reported.   Norman David, LPN  Subjective:   Norman Mitchell is a 81 y.o. male who presents for Medicare Annual/Subsequent preventive examination.  Review of Systems     Cardiac Risk Factors include: advanced age (>69mn, >>20women);male gender     Objective:    There were no vitals filed for this visit. There is no height or weight on file to calculate BMI.     11/05/2021    8:49 AM 07/02/2020   10:50 AM 01/09/2020   12:23 PM 11/08/2019    4:08 PM  Advanced Directives  Does Patient Have a Medical Advance Directive? No;Yes Yes Yes Yes  Type of AParamedicof AWasillaLiving will HAnnapolisLiving will HMononaLiving will HBowlusLiving will  Does patient want to make changes to medical advance directive? Yes (Inpatient - patient defers changing a medical advance directive and declines information at this time) No - Patient declined No - Patient declined   Copy of HCarmel Valley Villagein Chart? Yes - validated most recent copy scanned in chart (See row  information) No - copy requested Yes - validated most recent copy scanned in chart (See row information) No - copy requested  Would patient like information on creating a medical advance directive? No - Patient declined       Current Medications (verified) Outpatient Encounter Medications as of 11/05/2021  Medication Sig   albuterol (VENTOLIN HFA) 108 (90 Base) MCG/ACT inhaler Inhale 1-2 puffs into the lungs every 6 (six) hours as needed.   alfuzosin (UROXATRAL) 10 MG 24 hr tablet Take 1 tablet (10 mg total) by mouth daily.   aspirin EC 81 MG tablet Take 1 tablet (81 mg total) by mouth daily. Swallow whole.   fluticasone (FLONASE) 50 MCG/ACT nasal spray Place 2 sprays into both nostrils daily.   levothyroxine (SYNTHROID) 88 MCG tablet Take 1 tablet (88 mcg total) by mouth daily.   Lutein 20 MG TABS    Multiple Vitamins-Minerals (SENIOR MULTIVITAMIN PLUS PO)    NON FORMULARY Cocoavia 750 mg daily.   Omega-3 1000 MG CAPS Take by mouth. 3 times a week   sildenafil (REVATIO) 20 MG tablet Take 1-5 tablets (20-100 mg total) by mouth daily as needed.   apixaban (ELIQUIS) 5 MG TABS tablet Take 1 tablet by mouth 2 (two) times daily. (Patient not taking: Reported on 11/05/2021)   CRANBERRY EXTRACT PO Take 500 mg by mouth 3 (three) times a week.    Lutein 20 MG CAPS Take by mouth.   No facility-administered encounter medications on file as of 11/05/2021.    Allergies (verified) Bactrim [sulfamethoxazole-trimethoprim], Ciprofloxacin, Ibuprofen, and Levofloxacin   History: Past Medical  History:  Diagnosis Date   Arthritis    Hands, knee   Back pain    Complication of anesthesia    Was told after 1 surgery that his HR went "really low".  Says his normal HR is 50-55.   DVT (deep venous thrombosis) ([facility]) 2017   Bilateral knees.  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   Paroxysmal atrial fibrillation (HCC)    Prostatitis    Scoliosis     Thrombocytopenia (HCC)    Venous insufficiency    Past Surgical History:  Procedure Laterality Date   KNEE ARTHROSCOPY     KNEE ARTHROSCOPY WITH MEDIAL MENISECTOMY Right 01/09/2020   Procedure: RIGHT KNEE ARTHROSCOPY WITH MEDIAL MENISECTOMY;  Surgeon: Thornton Park, MD;  Location: [facility];  Service: Orthopedics;  Laterality: Right;   Oral biopsy     PILONIDAL CYST EXCISION     PROSTATE BIOPSY     Negative   SEPTOPLASTY     SHOULDER ARTHROSCOPY WITH ROTATOR CUFF REPAIR AND SUBACROMIAL DECOMPRESSION Right 07/02/2020   Procedure: RIGHT SHOULDER ARTHROSCOPY  DISTAL CLAVICLE EXCISION, BICEPS TENODESIS, SUBACROMIAL DECOMPRESSION, ANTERIOR LABRAL REPAIR. AND MINI OPEN ROTATOR CUFF REPAIR;  Surgeon: Thornton Park, MD;  Location: [facility];  Service: Orthopedics;  Laterality: Right;   TONSILLECTOMY     Family History  Problem Relation Age of Onset   Heart disease Mother    High Cholesterol Father    High blood pressure Father    Stroke Father    Prostate cancer Paternal Grandfather        Possible diagnosis, not definite.   Colon cancer Neg Hx    Social History   Socioeconomic History   Marital status: Married    Spouse name: Not on file   Number of children: Not on file   Years of education: Not on file   Highest education level: Not on file  Occupational History   Not on file  Tobacco Use   Smoking status: Never    Passive exposure: Never   Smokeless tobacco: Never  Vaping Use   Vaping Use: Never used  Substance and Sexual Activity   Alcohol use: Never   Drug use: Never   Sexual activity: Not Currently  Other Topics Concern   Not on file  Social History Narrative   From [place], [place].    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   [employer] [phone] through 5.  E[employer].  No service related issues.   Enjoys hiking, painting, Scientist, water quality, woodworking   Lives at [facility].   Social  Determinants of Health   Financial Resource Strain: Low Risk  (11/05/2021)   Overall Financial Resource Strain (CARDIA)    Difficulty of Paying Living Expenses: Not hard at all  Food Insecurity: No Food Insecurity (11/05/2021)   Hunger Vital Sign    Worried About Running Out of Food in the Last Year: Never true    Ran Out of Food in the Last Year: Never true  Transportation Needs: No Transportation Needs (11/05/2021)   PRAPARE - Hydrologist (Medical): No    Lack of Transportation (Non-Medical): No  Physical Activity: Sufficiently Active (11/05/2021)   Exercise Vital Sign    Days of Exercise per Week: 6 days    Minutes of Exercise per Session: 40 min  Stress: No Stress Concern Present (  11/05/2021)   [place]    Feeling of Stress : Not at all  Social Connections: Moderately Integrated (11/05/2021)   Social Connection and Isolation Panel [NHANES]    Frequency of Communication with Friends and Family: Once a week    Frequency of Social Gatherings with Friends and Family: More than three times a week    Attends Religious Services: More than 4 times per year    Active Member of [employer] or Organizations: No    Attends Music therapist: Never    Marital Status: Married    Tobacco Counseling Counseling given: Not Answered   Clinical Intake:  Pre-visit preparation completed: Yes  Pain : No/denies pain     Nutritional Risks: None Diabetes: No  How often do you need to have someone help you when you read instructions, pamphlets, or other written materials from your doctor or pharmacy?: 1 - Never  Diabetic?no  Interpreter Needed?: No  Information entered by :: Kirke Shaggy, LPN   Activities of Daily Living    11/05/2021    8:50 AM 11/01/2021    8:51 AM  In your present state of health, do you have any difficulty performing the following activities:  Hearing? 0 0  Vision? 0 0   Difficulty concentrating or making decisions? 0 0  Walking or climbing stairs? 0 0  Dressing or bathing? 0 0  Doing errands, shopping? 0 0  Preparing Food and eating ? N N  Using the Toilet? N N  In the past six months, have you accidently leaked urine? N N  Do you have problems with loss of bowel control? N N  Managing your Medications? N N  Managing your Finances? N N  Housekeeping or managing your Housekeeping? N N    Patient Care Team: Tonia Ghent, MD as PCP - General (Family Medicine) Kate Sable, MD as PCP - Cardiology (Cardiology) Vickie Epley, MD as PCP - Electrophysiology (Cardiology)  Indicate any recent Medical Services you may have received from other than [facility] providers in the past year (date may be approximate).     Assessment:   This is a routine wellness examination for [place].  Hearing/Vision screen Hearing Screening - Comments:: No aids Vision Screening - Comments:: Wears glasses- [place]  Dietary issues and exercise activities discussed: Current Exercise Habits: Home exercise routine, Type of exercise: walking, Time (Minutes): 40, Frequency (Times/Week): 6, Weekly Exercise (Minutes/Week): 240, Intensity: Mild   Goals Addressed             This Visit's Progress    DIET - EAT MORE FRUITS AND VEGETABLES         Depression Screen    11/05/2021    8:47 AM 11/08/2019    4:12 PM  PHQ 2/9 Scores  PHQ - 2 Score 0 0  PHQ- 9 Score 0 0    Fall Risk    11/05/2021    8:50 AM 11/01/2021    8:51 AM 11/07/2020    9:28 AM 11/08/2019    4:10 PM  Fall Risk   Falls in the past year? 0 0 0 0  Number falls in past yr: 0 0 0 0  Injury with Fall? 0 0  0  Risk for fall due to : No Fall Risks   Medication side effect  Follow up Falls evaluation completed   Falls evaluation completed;Falls prevention discussed    FALL RISK PREVENTION PERTAINING TO THE HOME:  Any stairs in or around the home? Yes  If so, are there any without handrails? No   Home free of loose throw rugs in walkways, pet beds, electrical cords, etc? Yes  Adequate lighting in your home to reduce risk of falls? Yes   ASSISTIVE DEVICES UTILIZED TO PREVENT FALLS:  Life alert? No  Use of a cane, walker or w/c? No  Grab bars in the bathroom? Yes  Shower chair or bench in shower? Yes  Elevated toilet seat or a handicapped toilet? No   Cognitive Function:    11/08/2019    4:26 PM  MMSE - Mini Mental State Exam  Orientation to time 5  Orientation to Place 5  Registration 3  Attention/ Calculation 5  Recall 3  Language- repeat 1        11/05/2021    8:52 AM  6CIT Screen  What Year? 0 points  What month? 0 points  What time? 0 points  Count back from 20 0 points  Months in reverse 0 points  Repeat phrase 0 points  Total Score 0 points    Immunizations Immunization History  Administered Date(s) Administered   Fluad Quad(high Dose 65+) 11/30/2019   Hepatitis A 04/04/2003, 10/23/2003   Hepatitis B 04/17/2004, 05/19/2004, 11/27/2004   IPV 04/24/2008   Influenza-Unspecified 12/30/2015, 11/28/2018, 12/12/2020   Moderna Covid-19 Vaccine Bivalent Booster 68yr & up 07/29/2021   Moderna Sars-Covid-2 Vaccination 03/16/2019, 04/13/2019, 01/16/2020, 07/18/2020   PFIZER(Purple Top)SARS-COV-2 Vaccination 11/22/2020   Pneumococcal Conjugate-13 10/12/2013   Pneumococcal Polysaccharide-23 05/08/2002, 11/13/2009   Tdap 03/27/2008, 08/08/2018   Zoster Recombinat (Shingrix) 12/07/2017, 03/14/2018    TDAP status: Up to date  Flu Vaccine status: Up to date  Pneumococcal vaccine status: Up to date  Covid-19 vaccine status: Completed vaccines  Qualifies for Shingles Vaccine? Yes   Zostavax completed No   Shingrix Completed?: Yes  Screening Tests Health Maintenance  Topic Date Due   Hepatitis C Screening  Never done   COVID-19 Vaccine (7 - Moderna risk series) 09/23/2021   INFLUENZA VACCINE  09/30/2021   TETANUS/TDAP  08/07/2028   Pneumonia Vaccine 81  Years old  Completed   Zoster Vaccines- Shingrix  Completed   HPV VACCINES  Aged Out    Health Maintenance  Health Maintenance Due  Topic Date Due   Hepatitis C Screening  Never done   COVID-19 Vaccine (7 - Moderna risk series) 09/23/2021   INFLUENZA VACCINE  09/30/2021    Colorectal cancer screening: No longer required.   Lung Cancer Screening: (Low Dose CT Chest recommended if Age 527-80years, 30 pack-year currently smoking OR have quit w/in 15years.) does not qualify.   Additional Screening:  Hepatitis C Screening: does qualify; Completed no  Vision Screening: Recommended annual ophthalmology exams for early detection of glaucoma and other disorders of the eye. Is the patient up to date with their annual eye exam?  Yes  Who is the provider or what is the name of the office in which the patient attends annual eye exams? Dr.Bulakowski If pt is not established with a provider, would they like to be referred to a provider to establish care? No .   Dental Screening: Recommended annual dental exams for proper oral hygiene  Community Resource Referral / Chronic Care Management: CRR required this visit?  No   CCM required this visit?  No      Plan:     I have personally reviewed and noted the following in the patient's chart:  Medical and social history Use of alcohol, tobacco or illicit drugs  Current medications and supplements including opioid prescriptions. Patient is not currently taking opioid prescriptions. Functional ability and status Nutritional status Physical activity Advanced directives List of other physicians Hospitalizations, surgeries, and ER visits in previous 12 months Vitals Screenings to include cognitive, depression, and falls Referrals and appointments  In addition, I have reviewed and discussed with patient certain preventive protocols, quality metrics, and best practice recommendations. A written personalized care plan for preventive services  as well as general preventive health recommendations were provided to patient.     Norman David, LPN   08/07/7371   Nurse Notes: none

## 2021-11-06 ENCOUNTER — Ambulatory Visit (INDEPENDENT_AMBULATORY_CARE_PROVIDER_SITE_OTHER): Payer: Medicare Other | Admitting: Family Medicine

## 2021-11-06 ENCOUNTER — Encounter: Payer: Self-pay | Admitting: Family Medicine

## 2021-11-06 VITALS — BP 136/72 | HR 55 | Temp 97.8°F | Ht 68.75 in | Wt 172.1 lb

## 2021-11-06 DIAGNOSIS — H9190 Unspecified hearing loss, unspecified ear: Secondary | ICD-10-CM

## 2021-11-06 DIAGNOSIS — Z85819 Personal history of malignant neoplasm of unspecified site of lip, oral cavity, and pharynx: Secondary | ICD-10-CM

## 2021-11-06 DIAGNOSIS — E039 Hypothyroidism, unspecified: Secondary | ICD-10-CM

## 2021-11-06 DIAGNOSIS — Z23 Encounter for immunization: Secondary | ICD-10-CM | POA: Diagnosis not present

## 2021-11-06 DIAGNOSIS — N529 Male erectile dysfunction, unspecified: Secondary | ICD-10-CM | POA: Diagnosis not present

## 2021-11-06 DIAGNOSIS — D696 Thrombocytopenia, unspecified: Secondary | ICD-10-CM | POA: Diagnosis not present

## 2021-11-06 DIAGNOSIS — I480 Paroxysmal atrial fibrillation: Secondary | ICD-10-CM | POA: Diagnosis not present

## 2021-11-06 DIAGNOSIS — Z7189 Other specified counseling: Secondary | ICD-10-CM

## 2021-11-06 DIAGNOSIS — Z0000 Encounter for general adult medical examination without abnormal findings: Secondary | ICD-10-CM

## 2021-11-06 MED ORDER — LEVOTHYROXINE SODIUM 88 MCG PO TABS: 88.0000 ug | ORAL_TABLET | Freq: Every day | ORAL | 3 refills | Status: DC

## 2021-11-06 MED ORDER — SILDENAFIL CITRATE 20 MG PO TABS: 20.0000 mg | ORAL_TABLET | Freq: Every day | ORAL | 12 refills | Status: DC | PRN

## 2021-11-06 NOTE — Patient Instructions (Signed)
We'll call about seeing audiology.  Ask the front about seeing Dr. Lorelei Pont.   Take care.  Glad to see you.

## 2021-11-06 NOTE — Progress Notes (Unsigned)
Off eliquis.  On aspirin.  D/w pt. risk and benefit of Eliquis versus aspirin discussed with patient at this point we both agree that aspirin made sense. No recent events with PAF.    Hypothyroidism.  Tsh wnl.  D/w pt.  Compliant, routine cautions d/w pt.  Swallowing well.    On uroxatral per urology.  I will defer.  He agrees.  Sugar 116 not fasting so that is normal.  Discussed.  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  D/w pt.    ED improved with sildenafil, up to '80mg'$  per dose.  No NTG.  No ADE on med.  D/w pt about hearing exam.  He noted hearing loss.  Referred.  D/w pt about replacement orthotics.  Lateral R foot pain.  Noted about 4 weeks ago.  No trauma.  Slightly ttp along R 5th.  I asked him to check about seeing Dr. Lorelei Pont.   Flu shot 2023 Tdap 2020 Covid booster d/w pt.  PNA up to date Shingles shot up to date.   Wife designated if patient were incapacitated. PSA wnl off finasteride 2023.   Colonoscopy done at age 59.  Diet and exercise d/w pt.  Doing well with both.   [facility]    H/o oral cancer with yearly follow up.  He had CXR and u/s in 2023.  D/w pt about yearly check.  Needs to be set up for yearly, ie ~May of 2024.  He had some previous voice change at baseline, with occ laryngitis.  As long as this is not getting worse then we would not need to do anything else.  If he has recurrent/worsening symptoms we can always refer him back to ENT.  He will update me as needed.  Meds, vitals, and allergies reviewed.   ROS: Per HPI unless specifically indicated in ROS section   GEN: nad, alert and oriented HEENT: ncat NECK: supple w/o LA CV: rrr.  PULM: ctab, no inc wob ABD: soft, +bs, small nontender soft umbilical hernia noted. EXT: trace BLE, R>L.   SKIN: no acute rash

## 2021-11-09 NOTE — Assessment & Plan Note (Signed)
On aspirin.  D/w pt. risk and benefit of Eliquis versus aspirin discussed with patient at this point we both agree that aspirin made sense. No recent events with PAF.

## 2021-11-09 NOTE — Assessment & Plan Note (Signed)
Wife designated if patient were incapacitated.  

## 2021-11-09 NOTE — Assessment & Plan Note (Signed)
Flu shot 2023 Tdap 2020 Covid booster d/w pt.  PNA up to date Shingles shot up to date.   Wife designated if patient were incapacitated. PSA wnl off finasteride 2023.   Colonoscopy done at age 81.  Diet and exercise d/w pt.  Doing well with both.   [facility]

## 2021-11-09 NOTE — Assessment & Plan Note (Signed)
Tsh wnl.  D/w pt.  Compliant, routine cautions d/w pt.  Swallowing well.   Continue levothyroxine as is.

## 2021-11-09 NOTE — Assessment & Plan Note (Signed)
Platelets stable.  Continue as is.

## 2021-11-09 NOTE — Assessment & Plan Note (Signed)
H/o oral cancer with yearly follow up.  He had CXR and u/s in 2023.  D/w pt about yearly check.  Needs to be set up for yearly, ie ~May of 2024.  He had some previous voice change at baseline, with occ laryngitis.  As long as this is not getting worse then we would not need to do anything else.  If he has recurrent/worsening symptoms we can always refer him back to ENT.  He will update me as needed.

## 2021-11-09 NOTE — Assessment & Plan Note (Signed)
Continue as needed sildenafil. 

## 2021-11-09 NOTE — Progress Notes (Unsigned)
    Derreon Consalvo T. Krystan Northrop, MD, [place] at [facility] [place], [address]  Phone: [phone]  FAX: [phone]  Norman Mitchell - 81 y.o. male  MRN [id number]  Date of Birth: Jul 31, 1940  Date: 11/10/2021  PCP: Tonia Ghent, MD  Referral: Tonia Ghent, MD  No chief complaint on file.  Subjective:   Norman Mitchell is a 81 y.o. very pleasant male patient with There is no height or weight on file to calculate BMI. who presents with the following:  81 year old presents with some ongoing right-sided foot pain.  Review of Systems is noted in the HPI, as appropriate  Objective:   There were no vitals taken for this visit.  GEN: No acute distress; alert,appropriate. PULM: Breathing comfortably in no respiratory distress PSYCH: Normally interactive.   Laboratory and Imaging Data:  Assessment and Plan:   ***

## 2021-11-10 ENCOUNTER — Ambulatory Visit: Payer: Medicare Other

## 2021-11-10 ENCOUNTER — Ambulatory Visit (INDEPENDENT_AMBULATORY_CARE_PROVIDER_SITE_OTHER)
Admission: RE | Admit: 2021-11-10 | Discharge: 2021-11-10 | Disposition: A | Discharge status: Home / Self Care | Payer: Medicare Other | Source: Ambulatory Visit | Attending: Family Medicine | Admitting: Family Medicine

## 2021-11-10 ENCOUNTER — Encounter: Payer: Self-pay | Admitting: Family Medicine

## 2021-11-10 ENCOUNTER — Ambulatory Visit (INDEPENDENT_AMBULATORY_CARE_PROVIDER_SITE_OTHER): Payer: Medicare Other | Admitting: Family Medicine

## 2021-11-10 VITALS — BP 130/70 | HR 50 | Temp 98.0°F | Ht 68.75 in | Wt 173.2 lb

## 2021-11-10 DIAGNOSIS — M79671 Pain in right foot: Secondary | ICD-10-CM

## 2021-11-18 ENCOUNTER — Telehealth: Payer: Self-pay | Admitting: Family Medicine

## 2021-11-18 ENCOUNTER — Encounter: Payer: Self-pay | Admitting: Family Medicine

## 2021-11-18 NOTE — Telephone Encounter (Signed)
Pt dropped off his Living Will forms to have looked over by Dr. Damita Dunnings. Forms are in pcp's folder.

## 2021-11-18 NOTE — Telephone Encounter (Signed)
Forms placed in Dr. Carole Civil inbox for review

## 2021-11-29 ENCOUNTER — Encounter: Payer: Self-pay | Admitting: Family Medicine

## 2021-12-29 ENCOUNTER — Ambulatory Visit (INDEPENDENT_AMBULATORY_CARE_PROVIDER_SITE_OTHER): Payer: Medicare Other | Admitting: Family Medicine

## 2021-12-29 ENCOUNTER — Encounter: Payer: Self-pay | Admitting: Family Medicine

## 2021-12-29 VITALS — BP 122/80 | HR 51 | Temp 98.1°F | Ht 68.75 in | Wt 178.0 lb

## 2021-12-29 DIAGNOSIS — K5900 Constipation, unspecified: Secondary | ICD-10-CM

## 2021-12-29 DIAGNOSIS — R5383 Other fatigue: Secondary | ICD-10-CM

## 2021-12-29 DIAGNOSIS — E039 Hypothyroidism, unspecified: Secondary | ICD-10-CM

## 2021-12-29 LAB — TSH: TSH: 1.55 u[IU]/mL (ref 0.35–5.50)

## 2021-12-29 LAB — CBC WITH DIFFERENTIAL/PLATELET
Basophils Absolute: 0 10*3/uL (ref 0.0–0.1)
Basophils Relative: 0.9 % (ref 0.0–3.0)
Eosinophils Absolute: 0.1 10*3/uL (ref 0.0–0.7)
Eosinophils Relative: 2.4 % (ref 0.0–5.0)
HCT: 43.1 % (ref 39.0–52.0)
Hemoglobin: 14.5 g/dL (ref 13.0–17.0)
Lymphocytes Relative: 20.1 % (ref 12.0–46.0)
Lymphs Abs: 1 10*3/uL (ref 0.7–4.0)
MCHC: 33.7 g/dL (ref 30.0–36.0)
MCV: 89.2 fl (ref 78.0–100.0)
Monocytes Absolute: 0.6 10*3/uL (ref 0.1–1.0)
Monocytes Relative: 12.8 % — ABNORMAL HIGH (ref 3.0–12.0)
Neutro Abs: 3.1 10*3/uL (ref 1.4–7.7)
Neutrophils Relative %: 63.8 % (ref 43.0–77.0)
Platelets: 96 10*3/uL — ABNORMAL LOW (ref 150.0–400.0)
RBC: 4.83 Mil/uL (ref 4.22–5.81)
RDW: 13.7 % (ref 11.5–15.5)
WBC: 4.9 10*3/uL (ref 4.0–10.5)

## 2021-12-29 LAB — COMPREHENSIVE METABOLIC PANEL
ALT: 15 U/L (ref 0–53)
AST: 21 U/L (ref 0–37)
Albumin: 4.2 g/dL (ref 3.5–5.2)
Alkaline Phosphatase: 73 U/L (ref 39–117)
BUN: 25 mg/dL — ABNORMAL HIGH (ref 6–23)
CO2: 30 mEq/L (ref 19–32)
Calcium: 8.9 mg/dL (ref 8.4–10.5)
Chloride: 102 mEq/L (ref 96–112)
Creatinine, Ser: 1.1 mg/dL (ref 0.40–1.50)
GFR: 64.02 mL/min (ref >60.00)
Glucose, Bld: 101 mg/dL — ABNORMAL HIGH (ref 70–99)
Potassium: 4.1 mEq/L (ref 3.5–5.1)
Sodium: 139 mEq/L (ref 135–145)
Total Bilirubin: 1.9 mg/dL — ABNORMAL HIGH (ref 0.2–1.2)
Total Protein: 6.4 g/dL (ref 6.0–8.3)

## 2021-12-29 MED ORDER — POLYETHYLENE GLYCOL 3350 17 GM/SCOOP PO POWD: 17.0000 g | Freq: Every day | ORAL | Status: DC | PRN

## 2021-12-29 NOTE — Progress Notes (Unsigned)
Constipation going on for a few months.  Tried miralax and citracel.  Hard stools unless he takes miralax.  BM this AM.  No bloody or black stools.  He has lifelong h/o cycles of constipation vs loose stools with intervals w/o troubles.  H/o colonoscopy at 85.    He notes some muscle aches in the legs.  Quad, hamstring, and calf.  Episodic in the B legs.  Some fatigue. He felt similar with prev episode of under-treated hypothyroidism.  Discussed rechecking labs.  A friend of his was recently hospitalized, discussed grief associated with that.  Condolences offered.  Meds, vitals, and allergies reviewed.   ROS: Per HPI unless specifically indicated in ROS section   Nad ncat Tearful but regains composure. Neck supple, no LA, no thyromegaly noted on exam. Rrr Ctab Abd soft, not ttp Extremities well perfused.  No edema.

## 2021-12-29 NOTE — Patient Instructions (Signed)
Try miralax daily for about 10 days.  Then try to take every other day.  Stay well hydrated.  Try to keep taking fiber with meals.   Take care.  Glad to see you. Update me if not better at that point.   Go to the lab on the way out.   If you have mychart we'll likely use that to update you.

## 2021-12-30 DIAGNOSIS — K5900 Constipation, unspecified: Secondary | ICD-10-CM | POA: Insufficient documentation

## 2021-12-30 NOTE — Assessment & Plan Note (Signed)
He notes some muscle aches in the legs.  Quad, hamstring, and calf.  Episodic in the B legs.  Some fatigue. He felt similar with prev episode of under-treated hypothyroidism.  Discussed rechecking labs.

## 2021-12-30 NOTE — Assessment & Plan Note (Signed)
Reasonable to try miralax daily for about 10 days.  Then try to take every other day.  Stay well hydrated.  Try to keep taking fiber with meals.   Update me if not better at that point.

## 2022-02-10 ENCOUNTER — Encounter: Payer: Self-pay | Admitting: Family Medicine

## 2022-02-16 ENCOUNTER — Encounter: Payer: Self-pay | Admitting: Family Medicine

## 2022-03-12 ENCOUNTER — Ambulatory Visit (INDEPENDENT_AMBULATORY_CARE_PROVIDER_SITE_OTHER): Payer: Medicare Other | Admitting: Family Medicine

## 2022-03-12 ENCOUNTER — Encounter: Payer: Self-pay | Admitting: Family Medicine

## 2022-03-12 VITALS — BP 118/80 | HR 50 | Temp 98.1°F | Ht 68.75 in | Wt 180.0 lb

## 2022-03-12 DIAGNOSIS — K219 Gastro-esophageal reflux disease without esophagitis: Secondary | ICD-10-CM | POA: Diagnosis not present

## 2022-03-12 MED ORDER — FAMOTIDINE 20 MG PO TABS: 20.0000 mg | ORAL_TABLET | Freq: Every day | ORAL | Status: DC | PRN

## 2022-03-12 MED ORDER — POLYETHYLENE GLYCOL 3350 17 GM/SCOOP PO POWD: 17.0000 g | ORAL | Status: AC

## 2022-03-12 NOTE — Progress Notes (Signed)
Patient states he had been sick for a bit with URI sx.  He had a sore throat but the sore throat continued after being sick. He feels like he can still taste certain foods afterwards or in his throat also; mainly happens with chocolate and tomatoes.  he cut out trigger foods, i.e. chocolate and tomatoes and got better.  This AM had chocolate and then sx came back, mildly.  Change in taste- acidic taste to his saliva. No sx at night. No abd pain.  No sternal burning. Occ contraction on the abd muscles after eating.  Used TUMS prn, w/o much relief.    He had R cataract surgery Monday in [place]. He had L eye surgery pending for 03/26/22.    Meds, vitals, and allergies reviewed.   ROS: Per HPI unless specifically indicated in ROS section   GEN: nad, alert and oriented HEENT: ncat NECK: supple w/o LA CV: rrr.  no PULM: ctab, no inc wob ABD: soft, +bs EXT: no edema SKIN: Well-perfused

## 2022-03-12 NOTE — Patient Instructions (Addendum)
Try pepcid in the meantime.  Then taper off gradually after about 1 month.   Take care.  Glad to see you. Update me as needed.

## 2022-03-15 DIAGNOSIS — K219 Gastro-esophageal reflux disease without esophagitis: Secondary | ICD-10-CM | POA: Insufficient documentation

## 2022-03-15 NOTE — Assessment & Plan Note (Signed)
Presumed GERD.  Discussed options. Reasonable to try pepcid in the meantime.  Then taper off gradually after about 1 month.  Update me as needed.  He agrees with plan.

## 2022-04-08 ENCOUNTER — Encounter: Payer: Self-pay | Admitting: Family Medicine

## 2022-05-27 ENCOUNTER — Other Ambulatory Visit: Payer: Self-pay | Admitting: Family Medicine

## 2022-05-27 NOTE — Telephone Encounter (Signed)
Refill request Albuterol Last office visit 03/12/22 Last refill 09/04/20 18 G/3 Upcoming appointment 11/26/22

## 2022-06-04 ENCOUNTER — Telehealth: Payer: Self-pay | Admitting: Family Medicine

## 2022-06-04 NOTE — Telephone Encounter (Signed)
Contacted Norman Mitchell to schedule their annual wellness visit. Appointment made for 11/26/2022.  [place] Direct Dial: [phone]

## 2022-06-04 NOTE — Telephone Encounter (Signed)
Called patient to schedule Medicare Annual Wellness Visit (AWV). Left message for patient to call back and schedule Medicare Annual Wellness Visit (AWV).  Last date of AWV: 11/05/2021  Please schedule an appointment at any time with [facility] .  If any questions, please contact me at [phone].  Thank you ,  [place] Direct Dial: [phone]

## 2022-06-17 ENCOUNTER — Ambulatory Visit: Payer: Medicare Other | Admitting: Urology

## 2022-06-18 ENCOUNTER — Encounter: Payer: Self-pay | Admitting: Urology

## 2022-06-18 ENCOUNTER — Ambulatory Visit (INDEPENDENT_AMBULATORY_CARE_PROVIDER_SITE_OTHER): Payer: Medicare Other | Admitting: Urology

## 2022-06-18 VITALS — BP 169/92 | HR 56 | Ht 69.0 in | Wt 166.0 lb

## 2022-06-18 DIAGNOSIS — Z125 Encounter for screening for malignant neoplasm of prostate: Secondary | ICD-10-CM

## 2022-06-18 DIAGNOSIS — N401 Enlarged prostate with lower urinary tract symptoms: Secondary | ICD-10-CM | POA: Diagnosis not present

## 2022-06-18 DIAGNOSIS — N138 Other obstructive and reflux uropathy: Secondary | ICD-10-CM

## 2022-06-18 DIAGNOSIS — N529 Male erectile dysfunction, unspecified: Secondary | ICD-10-CM | POA: Diagnosis not present

## 2022-06-18 LAB — BLADDER SCAN AMB NON-IMAGING

## 2022-06-18 MED ORDER — SILDENAFIL CITRATE 100 MG PO TABS: 100.0000 mg | ORAL_TABLET | Freq: Every day | ORAL | 11 refills | Status: DC | PRN

## 2022-06-18 MED ORDER — ALFUZOSIN HCL ER 10 MG PO TB24: 10.0000 mg | ORAL_TABLET | Freq: Every day | ORAL | 3 refills | Status: DC

## 2022-06-18 NOTE — Progress Notes (Signed)
   06/18/2022 4:53 PM   Norman Mitchell February 12, 1941 [id number]  Reason for visit: Follow up BPH, ED, nocturia, PSA screening  HPI: Healthy 82 year old male who I have followed for the above issues.  He has a long history of mildly elevated PSA of 3-4 through 2015 when he started finasteride, then had an appropriate decrease in PSA to about 1.8 which had been stable.  His prostate MRI in 2016 was negative for malignancy and a prostate volume was 50 g.  At our last visit we had opted to discontinue PSA screening per the AUA guidelines, and he also had discontinued the finasteride.  His PCP has continued to check PSA, and was 4.7 in September 2022 as expected after stopping the finasteride.  Reassurance was provided that this is within the normal range for his age, and we reviewed the AUA guidelines that do not recommend routine screening in men over age 40.  PSA was checked again by PCP in August 2023 and was stable at 3.79, well within the normal range for his age.  In terms of his urinary symptoms he overall has done well.  He remains on the alfuzosin.  He does report some urgency, and rare urge incontinence.  PVR is normal at 60ml.  He recently had a few weeks of dark-colored urine and some dysuria which he treated with cranberry tablets and has completely resolved his symptoms.  I offered a UA if symptoms recur and he understands need to evaluate for UTI if persistent dysuria.  We again reviewed other alternative options like cystoscopy/TRUS for further evaluation of prostate anatomy/volume and consideration of outlet procedures, but he is not bothered enough at this time to consider that.  He has expressed interest in UroLift in the past, and we reviewed that procedure again today.  In terms of his ED, he is currently taking 100 mg sildenafil with good results.  -Alfuzosin refilled -Sildenafil 100 mg refilled -Recommend discontinuing PSA screening per the guideline recommendations -RTC 1 year  PVR, sooner if worsening urinary symptoms and opts for cystoscopy/TRUS   Sondra Come, MD  [facility] [address], [address] [place], [place] [address] [phone]

## 2022-06-18 NOTE — Patient Instructions (Signed)
Prostatic Urethral Lift  Prostatic urethral lift is a surgical procedure to treat symptoms of prostate gland enlargement that occurs with age (benign prostatic hypertrophy, BPH). The urethra passes between the two lobes of the prostate. The urethra is the part of the body that drains urine from the bladder. As the prostate enlarges, it can push on the urethra and cause problems with urinating. This procedure involves placing an implant that holds the prostate away from the urethra. The procedure is done using a thin device called a cystoscope. The device is inserted through the tip of the penis and moved up the urethra to the prostate. This is less invasive than other procedures that require an incision. You may have this procedure if: You have symptoms of BPH. Your prostate is not severely enlarged. Medicines to treat BPH are not working or not tolerated. You want to avoid possible sexual side effects from medicines or other procedures that are used to treat BPH. Tell a health care provider about: Any allergies you have. All medicines you are taking, including vitamins, herbs, eye drops, creams, and over-the-counter medicines. Any problems you or family members have had with anesthetic medicines. Any bleeding problems you have. Any surgeries you have had. Any medical conditions you have. What are the risks? Generally, this is a safe procedure. However, problems may occur, including: Bleeding. Infection. Leaking of urine (incontinence). Allergic reactions to medicines. Return of BPH symptoms after 2 years, requiring more treatment. What happens before the procedure? When to stop eating and drinking Follow instructions from your health care provider about what you may eat and drink before your procedure. These may include: 8 hours before your procedure Stop eating most foods. Do not eat meat, fried foods, or fatty foods. Eat only light foods, such as toast or crackers. All liquids are  okay except energy drinks and alcohol. 6 hours before your procedure Stop eating. Drink only clear liquids, such as water, clear fruit juice, black coffee, plain tea, and sports drinks. Do not drink energy drinks or alcohol. 2 hours before your procedure Stop drinking all liquids. You may be allowed to take medicines with small sips of water. If you do not follow your health care provider's instructions, your procedure may be delayed or canceled. Medicines Ask your health care provider about: Changing or stopping your regular medicines. This is especially important if you are taking diabetes medicines or blood thinners. Taking medicines such as aspirin and ibuprofen. These medicines can thin your blood. Do not take these medicines unless your health care provider tells you to take them. Taking over-the-counter medicines, vitamins, herbs, and supplements. Surgery safety Ask your health care provider what steps will be taken to help prevent infection. These steps may include: Removing hair at the surgery site. Washing skin with a germ-killing soap. Taking antibiotic medicine. General instructions Do not use any products that contain nicotine or tobacco for at least 4 weeks before the procedure. These products include cigarettes, chewing tobacco, and vaping devices, such as e-cigarettes. If you need help quitting, ask your health care provider. If you will be going home right after the procedure, plan to have a responsible adult: Take you home from the hospital or clinic. You will not be allowed to drive. Care for you for the time you are told. What happens during the procedure? An IV may be inserted into one of your veins. You will be given one or more of the following: A medicine to help you relax (sedative). A medicine that   is injected into your urethra to numb the area (local anesthetic). A medicine to make you fall asleep (general anesthetic). A cystoscope will be inserted into your  penis and moved through your urethra to your prostate. A device will be inserted through the cystoscope and used to press the lobes of your prostate away from your urethra. Implants will be inserted through the device to hold the lobes of your prostate in the widened position. The device and cystoscope will be removed. The procedure may vary among health care providers and hospitals. What happens after the procedure? Your blood pressure, heart rate, breathing rate, and blood oxygen level be monitored until you leave the hospital or clinic. If you were given a sedative during the procedure, it can affect you for several hours. Do not drive or operate machinery until your health care provider says that it is safe. Summary Prostatic urethral lift is a surgical procedure to relieve symptoms of prostate gland enlargement that occurs with age (benign prostatic hypertrophy, BPH). The procedure is performed with a thin device called a cystoscope. This device is inserted through the tip of the penis and moved up the urethra to reach the prostate. This is less invasive than other procedures that require an incision. If you will be going home right after the procedure, plan to have a responsible adult take you home from the hospital or clinic. You will not be allowed to drive. This information is not intended to replace advice given to you by your health care provider. Make sure you discuss any questions you have with your health care provider. Document Revised: 09/13/2020 Document Reviewed: 09/13/2020 Elsevier Patient Education  2023 Elsevier Inc.  

## 2022-06-28 NOTE — Progress Notes (Unsigned)
    Norman Budlong T. Delicia Berens, MD, CAQ Sports Medicine [facility] at [facility] [address] [place], [address]  Phone: [phone]  FAX: [phone]  Norman Mitchell - 82 y.o. male  MRN [id number]  Date of Birth: 01/20/1941  Date: 06/29/2022  PCP: Joaquim Nam, MD  Referral: Joaquim Nam, MD  No chief complaint on file.  Subjective:   Norman Mitchell is a 82 y.o. very pleasant male patient with There is no height or weight on file to calculate BMI. who presents with the following:  The patient presents with ongoing left-sided acute foot pain.    Review of Systems is noted in the HPI, as appropriate  Objective:   There were no vitals taken for this visit.  GEN: No acute distress; alert,appropriate. PULM: Breathing comfortably in no respiratory distress PSYCH: Normally interactive.   Laboratory and Imaging Data:  Assessment and Plan:   ***

## 2022-06-29 ENCOUNTER — Ambulatory Visit (INDEPENDENT_AMBULATORY_CARE_PROVIDER_SITE_OTHER): Payer: Medicare Other | Admitting: Family Medicine

## 2022-06-29 ENCOUNTER — Encounter: Payer: Self-pay | Admitting: Family Medicine

## 2022-06-29 VITALS — BP 140/80 | HR 55 | Temp 98.0°F | Ht 69.0 in | Wt 176.1 lb

## 2022-06-29 DIAGNOSIS — M79672 Pain in left foot: Secondary | ICD-10-CM | POA: Diagnosis not present

## 2022-06-29 DIAGNOSIS — M76822 Posterior tibial tendinitis, left leg: Secondary | ICD-10-CM | POA: Diagnosis not present

## 2022-07-10 ENCOUNTER — Encounter: Payer: Self-pay | Admitting: Nurse Practitioner

## 2022-07-10 ENCOUNTER — Ambulatory Visit (INDEPENDENT_AMBULATORY_CARE_PROVIDER_SITE_OTHER): Payer: Medicare Other | Admitting: Nurse Practitioner

## 2022-07-10 VITALS — BP 102/64 | HR 50 | Temp 98.4°F | Resp 16 | Ht 69.0 in | Wt 180.0 lb

## 2022-07-10 DIAGNOSIS — R300 Dysuria: Secondary | ICD-10-CM | POA: Insufficient documentation

## 2022-07-10 DIAGNOSIS — N3001 Acute cystitis with hematuria: Secondary | ICD-10-CM | POA: Diagnosis not present

## 2022-07-10 DIAGNOSIS — Z87448 Personal history of other diseases of urinary system: Secondary | ICD-10-CM | POA: Insufficient documentation

## 2022-07-10 DIAGNOSIS — R8290 Unspecified abnormal findings in urine: Secondary | ICD-10-CM | POA: Insufficient documentation

## 2022-07-10 LAB — POC URINALSYSI DIPSTICK (AUTOMATED)
Bilirubin, UA: NEGATIVE
Glucose, UA: NEGATIVE
Ketones, UA: NEGATIVE
Nitrite, UA: NEGATIVE
Protein, UA: NEGATIVE
Spec Grav, UA: 1.01 (ref 1.010–1.025)
Urobilinogen, UA: 0.2 E.U./dL
pH, UA: 6 (ref 5.0–8.0)

## 2022-07-10 LAB — BASIC METABOLIC PANEL
BUN: 27 mg/dL — ABNORMAL HIGH (ref 6–23)
CO2: 30 mEq/L (ref 19–32)
Calcium: 8.5 mg/dL (ref 8.4–10.5)
Chloride: 99 mEq/L (ref 96–112)
Creatinine, Ser: 1.26 mg/dL (ref 0.40–1.50)
GFR: 54.19 mL/min — ABNORMAL LOW (ref >60.00)
Glucose, Bld: 85 mg/dL (ref 70–99)
Potassium: 3.7 mEq/L (ref 3.5–5.1)
Sodium: 136 mEq/L (ref 135–145)

## 2022-07-10 MED ORDER — CEPHALEXIN 500 MG PO CAPS
500.0000 mg | ORAL_CAPSULE | Freq: Two times a day (BID) | ORAL | 0 refills | Status: DC
Start: 2022-07-10 — End: 2022-07-13

## 2022-07-10 NOTE — Assessment & Plan Note (Signed)
Given UA we will treat patient with Keflex 500 mg twice daily for 7 days.  He does have an allergy to Bactrim and fluoroquinolones.  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  Pending urine culture can always consider switching him to amoxicillin if concern for prostatitis acute on chronic flare

## 2022-07-10 NOTE — Assessment & Plan Note (Signed)
UA in office 

## 2022-07-10 NOTE — Patient Instructions (Signed)
Nice to see you today I have sent antibiotics to the pharmacy Follow up if no improvement If you get worse over the weekend be seen in urgent care of the emergency department

## 2022-07-10 NOTE — Progress Notes (Signed)
Acute Office Visit  Subjective:     Patient ID: Norman Mitchell, male    DOB: 07-19-1940, 82 y.o.   MRN: [id number]  Chief Complaint  Patient presents with   Urinary Tract Infection    Burning, feeling off X 1 week     Patient is in today for Dysuria with a history of PAF, hypothryoidism, thrombocytopenia, prostatits  States that he started with sympotms 1 week with history of sympotms.  Patient is followed by [facility] urology.  States he does have a history of chronic prostatitis.  He did call to be seen but they were able to see him today.  Patient's last bowel movement was approximately 2 days ago and was not painful.  Patient does mention that he has a baseline nocturia of 2-7 times at night this is his baseline and has not changed.  He does endorse some incomplete emptying.  Denies frequency, hematuria, testicular pain and swelling.  Patient does not have a history of kidney stones per his report    Review of Systems  Constitutional:  Positive for chills and fever. Negative for malaise/fatigue.  Gastrointestinal:  Negative for abdominal pain, nausea and vomiting.  Genitourinary:  Positive for dysuria. Negative for frequency and hematuria.       Nocturia "+" states no increase in baseline.   States that he has incomplete emptying  Musculoskeletal:  Positive for back pain.        Objective:    BP 102/64   Pulse (!) 50   Temp 98.4 F (36.9 C)   Resp 16   Ht 5\' 9"  (1.753 m)   Wt 180 lb (81.6 kg)   SpO2 98%   BMI 26.58 kg/m  BP Readings from Last 3 Encounters:  07/10/22 102/64  06/29/22 (!) 140/80  06/18/22 (!) 169/92   Wt Readings from Last 3 Encounters:  07/10/22 180 lb (81.6 kg)  06/29/22 176 lb 2 oz (79.9 kg)  06/18/22 166 lb (75.3 kg)      Physical Exam Vitals and nursing note reviewed.  Constitutional:      Appearance: Normal appearance.  Cardiovascular:     Rate and Rhythm: Normal rate and regular rhythm.     Heart sounds: Normal heart  sounds.  Pulmonary:     Effort: Pulmonary effort is normal.     Breath sounds: Normal breath sounds.  Abdominal:     General: Bowel sounds are normal. There is no distension.     Palpations: There is no mass.     Tenderness: There is no abdominal tenderness. There is no right CVA tenderness or left CVA tenderness.     Hernia: A hernia is present.  Neurological:     Mental Status: He is alert.     Results for orders placed or performed in visit on 07/10/22  POCT Urinalysis Dipstick (Automated)  Result Value Ref Range   Color, UA Straw    Clarity, UA clear    Glucose, UA Negative Negative   Bilirubin, UA Negative    Ketones, UA Negative    Spec Grav, UA 1.010 1.010 - 1.025   Blood, UA trace    pH, UA 6.0 5.0 - 8.0   Protein, UA Negative Negative   Urobilinogen, UA 0.2 0.2 or 1.0 E.U./dL   Nitrite, UA Negative    Leukocytes, UA Moderate (2+) (A) Negative        Assessment & Plan:   Problem List Items Addressed This Visit       Genitourinary  Acute cystitis with hematuria    Given UA we will treat patient with Keflex 500 mg twice daily for 7 days.  He does have an allergy to Bactrim and fluoroquinolones.  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  Pending urine culture can always consider switching him to amoxicillin if concern for prostatitis acute on chronic flare      Relevant Medications   cephALEXin (KEFLEX) 500 MG capsule   Other Relevant Orders   Urine Culture   Basic metabolic panel     Other   Burning with urination - Primary    UA in office      Relevant Orders   POCT Urinalysis Dipstick (Automated) (Completed)   History of decreased renal function    Patient states he does have a history of acute renal failure with the use of large doses of ibuprofen states has been using ibuprofen but just twice a day for his foot as of late.  States he feels like he is retaining fluid due to that we will check renal labs  to make sure patient is not in acute kidney injury.      Relevant Orders   Basic metabolic panel    Meds ordered this encounter  Medications   cephALEXin (KEFLEX) 500 MG capsule    Sig: Take 1 capsule (500 mg total) by mouth 2 (two) times daily for 7 days.    Dispense:  14 capsule    Refill:  0    Order Specific Question:   Supervising Provider    Answer:   TOWER, MARNE A [[id number]    Return if symptoms worsen or fail to improve.  Audria Nine, NP

## 2022-07-10 NOTE — Assessment & Plan Note (Signed)
Patient states he does have a history of acute renal failure with the use of large doses of ibuprofen states has been using ibuprofen but just twice a day for his foot as of late.  States he feels like he is retaining fluid due to that we will check renal labs to make sure patient is not in acute kidney injury.

## 2022-07-12 LAB — URINE CULTURE
MICRO NUMBER:: 14941272
SPECIMEN QUALITY:: ADEQUATE

## 2022-07-13 ENCOUNTER — Other Ambulatory Visit: Payer: Self-pay | Admitting: Nurse Practitioner

## 2022-07-13 DIAGNOSIS — N3001 Acute cystitis with hematuria: Secondary | ICD-10-CM

## 2022-07-13 MED ORDER — AMOXICILLIN 500 MG PO CAPS
500.0000 mg | ORAL_CAPSULE | Freq: Three times a day (TID) | ORAL | 0 refills | Status: AC
Start: 2022-07-13 — End: 2022-07-20

## 2022-07-15 ENCOUNTER — Ambulatory Visit: Payer: Medicare Other | Admitting: Physician Assistant

## 2022-07-16 ENCOUNTER — Encounter: Payer: Self-pay | Admitting: Nurse Practitioner

## 2022-07-16 DIAGNOSIS — N3001 Acute cystitis with hematuria: Secondary | ICD-10-CM

## 2022-07-17 ENCOUNTER — Ambulatory Visit: Payer: Medicare Other | Admitting: Physician Assistant

## 2022-07-21 ENCOUNTER — Other Ambulatory Visit: Payer: Self-pay | Admitting: Cardiology

## 2022-07-21 NOTE — Telephone Encounter (Signed)
Patient is coming tomorrow to see Sherie Don, NP

## 2022-07-21 NOTE — Telephone Encounter (Signed)
Please contact pt for future appointment. °Pt due for 12 month f/u. °

## 2022-07-21 NOTE — Telephone Encounter (Signed)
Can we get a UA from the patient to make sure that the UTI has cleared.

## 2022-07-22 ENCOUNTER — Ambulatory Visit: Payer: Medicare Other | Attending: Cardiology | Admitting: Cardiology

## 2022-07-22 ENCOUNTER — Encounter: Payer: Self-pay | Admitting: Cardiology

## 2022-07-22 VITALS — BP 148/80 | HR 58 | Ht 70.0 in | Wt 178.0 lb

## 2022-07-22 DIAGNOSIS — I480 Paroxysmal atrial fibrillation: Secondary | ICD-10-CM | POA: Diagnosis present

## 2022-07-22 DIAGNOSIS — I10 Essential (primary) hypertension: Secondary | ICD-10-CM | POA: Insufficient documentation

## 2022-07-22 MED ORDER — ASPIRIN 81 MG PO TBEC: DELAYED_RELEASE_TABLET | ORAL | 0 refills | Status: DC

## 2022-07-22 NOTE — Telephone Encounter (Signed)
Just lab is fine

## 2022-07-22 NOTE — Patient Instructions (Signed)
Medication Instructions:  Your physician recommends that you continue on your current medications as directed. Please refer to the Current Medication list given to you today.  *If you need a refill on your cardiac medications before your next appointment, please call your pharmacy*   Lab Work: No labs ordered  If you have labs (blood work) drawn today and your tests are completely normal, you will receive your results only by: MyChart Message (if you have MyChart) OR A paper copy in the mail If you have any lab test that is abnormal or we need to change your treatment, we will call you to review the results.   Testing/Procedures: No testing ordered  Follow-Up: At [facility], you and your health needs are our priority.  As part of our continuing mission to provide you with exceptional heart care, we have created designated Provider Care Teams.  These Care Teams include your primary Cardiologist (physician) and Advanced Practice Providers (APPs -  Physician Assistants and Nurse Practitioners) who all work together to provide you with the care you need, when you need it.  We recommend signing up for the patient portal called "MyChart".  Sign up information is provided on this After Visit Summary.  MyChart is used to connect with patients for Virtual Visits (Telemedicine).  Patients are able to view lab/test results, encounter notes, upcoming appointments, etc.  Non-urgent messages can be sent to your provider as well.   To learn more about what you can do with MyChart, go to [email or web address].    Your next appointment:   1 year(s)  Provider:   Steffanie Dunn, MD or Sherie Don, NP   Other Instructions Please check your blood pressure once daily for 7 days and send your readings to My Chart for review. Also check your [employer] mobile weekly.

## 2022-07-22 NOTE — Progress Notes (Signed)
Cardiology Office Note Date:  07/22/2022  Patient ID:  Echo, [name] [birth year], MRN [id number] PCP:  Joaquim Nam, MD  Cardiologist:  Debbe Odea, MD Electrophysiologist: Lanier Prude, MD    Chief Complaint: 1 year follow-up AFib  History of Present Illness: Norman Mitchell is a 82 y.o. male with PMH notable for parox AFib, DVT, hypothyroid; seen today for Lanier Prude, MD for routine electrophysiology followup.  Last saw Dr. Lalla Brothers 06/2021, had very rare AF episode, likely in setting of ETOH use. He monitors rhythm with KardiaMobile. Eliquis stopped at that visit.  Today, he tells me that he overall feels well. He continues to use KardiaMobile to monitor rhythm, though does not use as often as he once did. Checks about 1-2/month. Denies palpitations, chest pain, pressure. Good exercise tolerance.   He was recently diagnosed with UTI, was asymptomatic except his BP was very elevated. He finished abx earlier this week.  He checks BP weekly at home, readings are in 120s/70s except for during recent UTI.  Past Medical History:  Diagnosis Date   Arthritis    Hands, knee   Back pain    Complication of anesthesia    Was told after 1 surgery that his HR went "really low".  Says his normal HR is 50-55.   DVT (deep venous thrombosis) (HCC) 2017   Bilateral knees.  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   Hypothyroidism    Oral cancer (HCC)    Status post radiation.   Paroxysmal atrial fibrillation (HCC)    Prostatitis    Scoliosis    Thrombocytopenia (HCC)    Venous insufficiency     Past Surgical History:  Procedure Laterality Date   KNEE ARTHROSCOPY     KNEE ARTHROSCOPY WITH MEDIAL MENISECTOMY Right 01/09/2020   Procedure: RIGHT KNEE ARTHROSCOPY WITH MEDIAL MENISECTOMY;  Surgeon: Juanell Fairly, MD;  Location: [facility];  Service: Orthopedics;  Laterality: Right;   Oral biopsy     PILONIDAL CYST EXCISION     PROSTATE BIOPSY     Negative   SEPTOPLASTY      SHOULDER ARTHROSCOPY WITH ROTATOR CUFF REPAIR AND SUBACROMIAL DECOMPRESSION Right 07/02/2020   Procedure: RIGHT SHOULDER ARTHROSCOPY  DISTAL CLAVICLE EXCISION, BICEPS TENODESIS, SUBACROMIAL DECOMPRESSION, ANTERIOR LABRAL REPAIR. AND MINI OPEN ROTATOR CUFF REPAIR;  Surgeon: Juanell Fairly, MD;  Location: [facility];  Service: Orthopedics;  Laterality: Right;   TONSILLECTOMY      Current Outpatient Medications  Medication Instructions   albuterol (VENTOLIN HFA) 108 (90 Base) MCG/ACT inhaler INHALE ONE TO TWO PUFFS BY MOUTH EVERY 6 HOURS AS NEEDED   alfuzosin (UROXATRAL) 10 mg, Oral, Daily   aspirin EC (ASPIRIN LOW DOSE) 81 MG tablet TAKE 1 TABLET BY MOUTH DAILY **SWALLOW WHOLE**   CRANBERRY EXTRACT PO 500 mg, Oral, 3 times weekly   fluticasone (FLONASE) 50 MCG/ACT nasal spray 2 sprays, Each Nare, Daily   levothyroxine (SYNTHROID) 88 mcg, Oral, Daily   Lutein 20 MG CAPS Oral, Daily   Multiple Vitamins-Minerals (SENIOR MULTIVITAMIN PLUS PO) 1 tablet, Oral, Daily   NON FORMULARY Cocoavia 750 mg daily.    Omega-3 1000 MG CAPS Oral, 3 times a week   polyethylene glycol powder (GLYCOLAX/MIRALAX) 17 g, Oral, Every other day   sildenafil (VIAGRA) 100 mg, Oral, Daily PRN    Social History:  The patient  reports that he has never smoked. He has never been exposed to tobacco smoke. He has never used smokeless tobacco. He reports that he  does not drink alcohol and does not use drugs.   Family History:  The patient's family history includes Heart disease in his mother; High Cholesterol in his father; High blood pressure in his father; Prostate cancer in his paternal grandfather; Stroke in his father.  ROS:  Please see the history of present illness. All other systems are reviewed and otherwise negative.   PHYSICAL EXAM:  Vitals:   07/22/22 1342 07/22/22 1405  BP: (!) 144/108 (!) 148/80  Pulse: (!) 58   Height: 5\' 10"  (1.778 m)   Weight: 178 lb (80.7 kg)   SpO2: 98%   BMI  (Calculated): 25.54      GEN- The patient is well appearing, alert and oriented x 3 today.   Lungs- Clear to ausculation bilaterally, normal work of breathing.  Heart- Regular rate and rhythm, no murmurs, rubs or gallops Extremities- No peripheral edema, warm, dry   EKG was inadvertently not performed today  Recent Labs: 12/29/2021: ALT 15; Hemoglobin 14.5; Platelets 96.0; TSH 1.55 07/10/2022: BUN 27; Creatinine, Ser 1.26; Potassium 3.7; Sodium 136  10/30/2021: Cholesterol 139; HDL 55.90; LDL Cholesterol 70; Total CHOL/HDL Ratio 2; Triglycerides 65.0; VLDL 13.0   Estimated Creatinine Clearance: 47.5 mL/min (by C-G formula based on SCr of 1.26 mg/dL).   Wt Readings from Last 3 Encounters:  07/10/22 180 lb (81.6 kg)  06/29/22 176 lb 2 oz (79.9 kg)  06/18/22 166 lb (75.3 kg)     Additional studies reviewed include: Previous EP, cardiology notes.   TTE, 03/23/2019  1. Left ventricular ejection fraction, by visual estimation, is 55 to 60%. The left ventricle has normal function. There is mildly increased left ventricular hypertrophy.   2. The left ventricle has no regional wall motion abnormalities.   3. Global right ventricle has normal systolic function.The right ventricular size is mildly enlarged. No increase in right ventricular wall thickness.   4. Left atrial size was mild-moderately dilated.   5. Right atrial size was moderately dilated.   6. The mitral valve is grossly normal. Mild mitral valve regurgitation. No evidence of mitral stenosis.   7. The tricuspid valve is normal in structure.   8. The tricuspid valve is normal in structure. Tricuspid valve regurgitation is trivial.   9. The aortic valve is tricuspid. Aortic valve regurgitation is mild.  10. The pulmonic valve was not well visualized. Pulmonic valve regurgitation is not visualized.  11. TR signal is inadequate for assessing pulmonary artery systolic pressure.  12. The inferior vena cava is dilated in size with  <50% respiratory variability, suggesting right atrial pressure of 15 mmHg.  13. The interatrial septum was not well visualized.    ASSESSMENT AND PLAN:  #) parox AFib Minimal burden Continues to monitor with KardiaMobile  Encouraged more regularly use of KardiaMobile No AF burden Off OAC given low AF burden Continue 81mg  ASA   #) HTN Elevated in office today, though home readings are normotensive Encouraged he check BP daily x 1 week and send readings via mychart    Current medicines are reviewed at length with the patient today.   The patient does not have concerns regarding his medicines.  The following changes were made today:  none  Labs/ tests ordered today include:  No orders of the defined types were placed in this encounter.    Disposition: Follow up with Dr. Lalla Brothers or EP APP in 12 months   Signed, Sherie Don, NP  07/22/22  8:57 AM  Electrophysiology [facility]

## 2022-07-22 NOTE — Telephone Encounter (Signed)
Order placed

## 2022-07-22 NOTE — Telephone Encounter (Signed)
Would you like to see him in office or just lab only?

## 2022-07-22 NOTE — Telephone Encounter (Signed)
Patient called in to follow up on this. Scheduled patient for lab only appointment on 07/23/2022. Thank you!

## 2022-07-23 ENCOUNTER — Other Ambulatory Visit (INDEPENDENT_AMBULATORY_CARE_PROVIDER_SITE_OTHER): Payer: Medicare Other

## 2022-07-23 DIAGNOSIS — N3001 Acute cystitis with hematuria: Secondary | ICD-10-CM | POA: Diagnosis not present

## 2022-07-23 LAB — POCT URINALYSIS DIPSTICK
Bilirubin, UA: NEGATIVE
Glucose, UA: NEGATIVE
Ketones, UA: NEGATIVE
Leukocytes, UA: NEGATIVE
Nitrite, UA: NEGATIVE
Protein, UA: NEGATIVE
Spec Grav, UA: 1.02 (ref 1.010–1.025)
Urobilinogen, UA: 0.2 E.U./dL
pH, UA: 5.5 (ref 5.0–8.0)

## 2022-07-23 NOTE — Telephone Encounter (Signed)
Pt brought sample and results are in.

## 2022-07-27 ENCOUNTER — Telehealth: Payer: Self-pay | Admitting: Family Medicine

## 2022-07-27 DIAGNOSIS — Z85819 Personal history of malignant neoplasm of unspecified site of lip, oral cavity, and pharynx: Secondary | ICD-10-CM

## 2022-07-27 DIAGNOSIS — R8290 Unspecified abnormal findings in urine: Secondary | ICD-10-CM

## 2022-07-27 NOTE — Telephone Encounter (Signed)
Please call pt.  He is due for both carotid study and chest x-ray due to his history of oropharyngeal cancer.  We can get the CXR done here.  I put in the order for the carotid u/s.  He should get a call about scheduling that.  I would like to recheck his urine for blood when he comes in for the CXR so please schedule a nonfasting lab/xray visit.  Thanks.

## 2022-07-28 NOTE — Telephone Encounter (Signed)
Called and spoke to patient. Has received call for ultrasound today. Set up for xray and labs next week. Will call If any questions.

## 2022-07-30 ENCOUNTER — Other Ambulatory Visit: Payer: Self-pay | Admitting: Urology

## 2022-08-04 ENCOUNTER — Ambulatory Visit (INDEPENDENT_AMBULATORY_CARE_PROVIDER_SITE_OTHER)
Admission: RE | Admit: 2022-08-04 | Discharge: 2022-08-04 | Disposition: A | Discharge status: Home / Self Care | Payer: Medicare Other | Source: Ambulatory Visit | Attending: Family Medicine | Admitting: Family Medicine

## 2022-08-04 ENCOUNTER — Other Ambulatory Visit (INDEPENDENT_AMBULATORY_CARE_PROVIDER_SITE_OTHER): Payer: Medicare Other

## 2022-08-04 DIAGNOSIS — Z85819 Personal history of malignant neoplasm of unspecified site of lip, oral cavity, and pharynx: Secondary | ICD-10-CM

## 2022-08-04 DIAGNOSIS — R8290 Unspecified abnormal findings in urine: Secondary | ICD-10-CM

## 2022-08-05 LAB — URINALYSIS, ROUTINE W REFLEX MICROSCOPIC
Bilirubin Urine: NEGATIVE
Hgb urine dipstick: NEGATIVE
Ketones, ur: NEGATIVE
Leukocytes,Ua: NEGATIVE
Nitrite: NEGATIVE
RBC / HPF: NONE SEEN (ref 0–?)
Specific Gravity, Urine: 1.02 (ref 1.000–1.030)
Total Protein, Urine: NEGATIVE
Urine Glucose: NEGATIVE
Urobilinogen, UA: 0.2 (ref 0.0–1.0)
pH: 6 (ref 5.0–8.0)

## 2022-08-20 ENCOUNTER — Telehealth: Payer: Self-pay | Admitting: Cardiology

## 2022-08-20 ENCOUNTER — Encounter: Payer: Self-pay | Admitting: Cardiology

## 2022-08-20 NOTE — Telephone Encounter (Signed)
Left message for patient to call back.   Patient is scheduled for an appointment tomorrow with Dr. Lalla Brothers with notes of "abnormal EKG". Trying to find out where that was done so we can get a copy.

## 2022-08-20 NOTE — Progress Notes (Signed)
  Electrophysiology Office Follow up Visit Note:    Date:  08/21/2022   ID:  Norman Mitchell, DOB 1940-05-06, MRN [id number]  PCP:  Joaquim Nam, MD  [facility] Cardiologist:  Debbe Odea, MD  [facility] Electrophysiologist:  Lanier Prude, MD    Interval History:    Norman Mitchell is a 82 y.o. male who presents for a follow up visit.   Last seen Jul 22, 2022 by Luella Cook.  I last saw the patient in May 2023.  The patient has had rare A-fib episodes in the setting of alcohol use.  He uses a Sales executive to monitor his heart rhythms.  He is on aspirin 81 mg by mouth once daily.  He was previously on anticoagulation but this was stopped due to low A-fib burden and alcohol use.  He did have 1 episode of atrial fibrillation confirmed by his [facility] on June 15.     Past medical, surgical, social and family history were reviewed.  ROS:   Please see the history of present illness.    All other systems reviewed and are negative.  EKGs/Labs/Other Studies Reviewed:    The following studies were reviewed today:  EKG Interpretation  Date/Time:  Friday August 21 2022 11:29:38 EDT Ventricular Rate:  51 PR Interval:  174 QRS Duration: 144 QT Interval:  518 QTC Calculation: 477 R Axis:   -22 Text Interpretation: Sinus bradycardia with sinus arrhythmia Right bundle branch block Confirmed by Steffanie Dunn [phone]) on 08/21/2022 12:05:16 PM   The mobile tracings reviewed personally   Physical Exam:    VS:  BP 130/82   Pulse 98   Ht 5\' 10"  (1.778 m)   Wt 174 lb 12.8 oz (79.3 kg)   SpO2 (!) 53%   BMI 25.08 kg/m     Wt Readings from Last 3 Encounters:  08/21/22 174 lb 12.8 oz (79.3 kg)  07/22/22 178 lb (80.7 kg)  07/10/22 180 lb (81.6 kg)     GEN:  Well nourished, well developed in no acute distress CARDIAC: RRR, no murmurs, rubs, gallops RESPIRATORY:  Clear to auscultation without rales, wheezing or rhonchi       ASSESSMENT:    1. Paroxysmal  atrial fibrillation (HCC)   2. Essential hypertension    PLAN:    In order of problems listed above:  #Persistent atrial fibrillation Low burden.  Previous episodes of and in the setting of alcohol use.  Encouraged abstinence from alcohol.  Continue aspirin 81 mg by mouth once daily  Today we discussed his risk of stroke given a CHA2DS2-VASc of 2.  We discussed being on anticoagulation versus aspirin.  We discussed the risks of being on anticoagulation versus the risk of stroke off anticoagulation.  After our discussion, the patient decided to proceed with aspirin therapy alone.  He understands he is at a slight increased risk of stroke being off therapeutic anticoagulation.  We agreed to reassess his stroke risk moving forward as he ages or develops new medical comorbidities.  #Hypertension At goal today.  Recommend checking blood pressures 1-2 times per week at home and recording the values.  Recommend bringing these recordings to the primary care physician.   Follow-up 1 year with me.   Signed, Steffanie Dunn, MD, [facility] 08/21/2022 1:43 PM    Electrophysiology [facility]

## 2022-08-20 NOTE — Telephone Encounter (Signed)
Patient stated he was returning call to [facility].

## 2022-08-20 NOTE — Telephone Encounter (Signed)
Spoke with the patient who states that he has been monitoring his heart rhythm with his Kardia mobile. Over the weekend he could feel his heart racing. His monitor showed that he was in A Fib with HR of 113. He states that it lasted for about 1-2 hours. He denies any other symptoms and has not had any episodes since. This is the first episode of Afib that the patient has had in a long time. His Eliquis was discontinued about a year ago since his episodes are so rare. He is taking a baby aspirin daily.

## 2022-08-20 NOTE — Telephone Encounter (Signed)
Error

## 2022-08-21 ENCOUNTER — Ambulatory Visit: Payer: Medicare Other | Attending: Cardiology | Admitting: Cardiology

## 2022-08-21 ENCOUNTER — Encounter: Payer: Self-pay | Admitting: Cardiology

## 2022-08-21 VITALS — BP 130/82 | HR 53 | Ht 70.0 in | Wt 174.8 lb

## 2022-08-21 DIAGNOSIS — I10 Essential (primary) hypertension: Secondary | ICD-10-CM | POA: Insufficient documentation

## 2022-08-21 DIAGNOSIS — I480 Paroxysmal atrial fibrillation: Secondary | ICD-10-CM | POA: Diagnosis not present

## 2022-08-21 NOTE — Patient Instructions (Signed)
Medication Instructions:  Your physician recommends that you continue on your current medications as directed. Please refer to the Current Medication list given to you today.  *If you need a refill on your cardiac medications before your next appointment, please call your pharmacy*  Follow-Up: At [facility], you and your health needs are our priority.  As part of our continuing mission to provide you with exceptional heart care, we have created designated Provider Care Teams.  These Care Teams include your primary Cardiologist (physician) and Advanced Practice Providers (APPs -  Physician Assistants and Nurse Practitioners) who all work together to provide you with the care you need, when you need it.  Your next appointment:   1 year(s)  Provider:    Lanier Prude, MD

## 2022-08-22 ENCOUNTER — Encounter: Payer: Self-pay | Admitting: Cardiology

## 2022-08-24 ENCOUNTER — Encounter: Payer: Self-pay | Admitting: Cardiology

## 2022-09-04 ENCOUNTER — Telehealth: Payer: Self-pay | Admitting: Family Medicine

## 2022-09-04 NOTE — Telephone Encounter (Signed)
Norman Mitchell from [facility] contacted the office neede a pre authorization for procedure Carotid this patient is having on Monday 09/07/2022. Advised I would send this message on to referral team for pre auth. Please advise, thank you.

## 2022-09-07 ENCOUNTER — Ambulatory Visit: Payer: Medicare Other | Attending: Family Medicine

## 2022-09-07 DIAGNOSIS — Z85819 Personal history of malignant neoplasm of unspecified site of lip, oral cavity, and pharynx: Secondary | ICD-10-CM | POA: Insufficient documentation

## 2022-09-08 ENCOUNTER — Encounter: Payer: Self-pay | Admitting: Family Medicine

## 2022-09-09 NOTE — Telephone Encounter (Unsigned)
Please make sure your recent results are sent to Dr. Marcell Barlow at [facility].  Thanks.  Karrie Meres, MD  [address]  [address]  [place], [place] [address]  [phone] (Work)  [phone] (Fax)

## 2022-09-10 NOTE — Telephone Encounter (Signed)
Requested records have been faxed.

## 2022-09-21 ENCOUNTER — Telehealth: Payer: Self-pay | Admitting: Family Medicine

## 2022-09-21 MED ORDER — NIRMATRELVIR/RITONAVIR (PAXLOVID) TABLET (RENAL DOSING): 2.0000 | ORAL_TABLET | Freq: Two times a day (BID) | ORAL | 0 refills | Status: AC

## 2022-09-21 NOTE — Telephone Encounter (Signed)
Please call pt.  Travelling to [place]- rx sent for paxlovid if covid positive on the trip.  I don't know if insurance will cover this rx.  Could see about getting it filled and hold it in the meantime.    His rx is dosed for his kidney function- 2 tabs twice a day.  This is different from spouse's rx.  Do not take sildenafil with paxlovid.    Thanks.

## 2022-09-21 NOTE — Telephone Encounter (Signed)
Patients wife notified about rx and dosing and to not take with sildenafil. She verbalized understanding.

## 2022-09-23 NOTE — Telephone Encounter (Signed)
Please update patient.  One other issue- do not take ALFUZOSIN with paxlovid.  If he started paxlovid, then hold ALFUZOSIN.  Thanks.

## 2022-09-24 NOTE — Telephone Encounter (Signed)
Spoke with pt relaying Dr. Lianne Bushy message. Pt verbalizes understanding and states the pharmacist had also made him aware.

## 2022-10-05 ENCOUNTER — Other Ambulatory Visit: Payer: Self-pay | Admitting: Cardiology

## 2022-10-05 ENCOUNTER — Other Ambulatory Visit: Payer: Self-pay | Admitting: Family Medicine

## 2022-10-14 ENCOUNTER — Ambulatory Visit (INDEPENDENT_AMBULATORY_CARE_PROVIDER_SITE_OTHER): Payer: Medicare Other | Admitting: Internal Medicine

## 2022-10-14 ENCOUNTER — Encounter: Payer: Self-pay | Admitting: Internal Medicine

## 2022-10-14 VITALS — BP 130/84 | HR 44 | Temp 98.0°F | Ht 70.0 in | Wt 175.0 lb

## 2022-10-14 DIAGNOSIS — R300 Dysuria: Secondary | ICD-10-CM

## 2022-10-14 LAB — POC URINALSYSI DIPSTICK (AUTOMATED)
Bilirubin, UA: NEGATIVE
Blood, UA: NEGATIVE
Glucose, UA: NEGATIVE
Ketones, UA: NEGATIVE
Leukocytes, UA: NEGATIVE
Nitrite, UA: NEGATIVE
Protein, UA: NEGATIVE
Spec Grav, UA: 1.015 (ref 1.010–1.025)
Urobilinogen, UA: 0.2 E.U./dL
pH, UA: 6 (ref 5.0–8.0)

## 2022-10-14 MED ORDER — DOXYCYCLINE HYCLATE 100 MG PO TABS: 100.0000 mg | ORAL_TABLET | Freq: Two times a day (BID) | ORAL | 1 refills | Status: DC

## 2022-10-14 NOTE — Progress Notes (Signed)
Subjective:    Patient ID: Norman Mitchell, male    DOB: 14-May-1940, 82 y.o.   MRN: [id number]  HPI Here due to pain with urination again  When gets up at night--and first in AM--feels some burning No symptoms during the day No blood No fever Some urgency--no different than normal  Did have UTI in the spring--Enterococcus on culture  Has known BPH and chronic prostatitis  Current Outpatient Medications on File Prior to Visit  Medication Sig Dispense Refill   albuterol (VENTOLIN HFA) 108 (90 Base) MCG/ACT inhaler INHALE ONE TO TWO PUFFS BY MOUTH EVERY 6 HOURS AS NEEDED 8.5 g 2   alfuzosin (UROXATRAL) 10 MG 24 hr tablet TAKE ONE TABLET BY MOUTH DAILY 90 tablet 3   aspirin EC (ASPIRIN LOW DOSE) 81 MG tablet TAKE 1 TABLET BY MOUTH DAILY SWALLOW WHOLE 90 tablet 3   CRANBERRY EXTRACT PO Take 500 mg by mouth 3 (three) times a week.      fluticasone (FLONASE) 50 MCG/ACT nasal spray Place 2 sprays into both nostrils daily. 16 g 12   levothyroxine (SYNTHROID) 88 MCG tablet TAKE 1 TABLET BY MOUTH DAILY 90 tablet 3   Lutein 20 MG CAPS Take by mouth daily.     Multiple Vitamins-Minerals (SENIOR MULTIVITAMIN PLUS PO) Take 1 tablet by mouth daily.     NON FORMULARY Cocoavia 750 mg daily.     Omega-3 1000 MG CAPS Take by mouth. 3 times a week     polyethylene glycol powder (GLYCOLAX/MIRALAX) 17 GM/SCOOP powder Take 17 g by mouth every other day.     sildenafil (VIAGRA) 100 MG tablet Take 1 tablet (100 mg total) by mouth daily as needed for erectile dysfunction. 30 tablet 11   No current facility-administered medications on file prior to visit.    Allergies  Allergen Reactions   Bactrim [Sulfamethoxazole-Trimethoprim] Hives   Ciprofloxacin Other (See Comments)    Leg tendonitis    Ibuprofen Other (See Comments)    Renal failure with high doses.    Levofloxacin Other (See Comments)    Severe leg tendonitis    Past Medical History:  Diagnosis Date   Arthritis    Hands, knee   Back  pain    Complication of anesthesia    Was told after 1 surgery that his HR went "really low".  Says his normal HR is 50-55.   DVT (deep venous thrombosis) (HCC) 2017   Bilateral knees.  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   Hypothyroidism    Oral cancer (HCC)    Status post radiation.   Paroxysmal atrial fibrillation (HCC)    Prostatitis    Scoliosis    Thrombocytopenia (HCC)    Venous insufficiency     Past Surgical History:  Procedure Laterality Date   CATARACT EXTRACTION Bilateral    KNEE ARTHROSCOPY     KNEE ARTHROSCOPY WITH MEDIAL MENISECTOMY Right 01/09/2020   Procedure: RIGHT KNEE ARTHROSCOPY WITH MEDIAL MENISECTOMY;  Surgeon: Juanell Fairly, MD;  Location: [facility];  Service: Orthopedics;  Laterality: Right;   Oral biopsy     PILONIDAL CYST EXCISION     PROSTATE BIOPSY     Negative   SEPTOPLASTY     SHOULDER ARTHROSCOPY WITH ROTATOR CUFF REPAIR AND SUBACROMIAL DECOMPRESSION Right 07/02/2020   Procedure: RIGHT SHOULDER ARTHROSCOPY  DISTAL CLAVICLE EXCISION, BICEPS TENODESIS, SUBACROMIAL DECOMPRESSION, ANTERIOR LABRAL REPAIR. AND MINI OPEN ROTATOR CUFF REPAIR;  Surgeon: Juanell Fairly, MD;  Location: [facility];  Service: Orthopedics;  Laterality:  Right;   TONSILLECTOMY      Family History  Problem Relation Age of Onset   Heart disease Mother    High Cholesterol Father    High blood pressure Father    Stroke Father    Prostate cancer Paternal Grandfather        Possible diagnosis, not definite.   Colon cancer Neg Hx     Social History   Socioeconomic History   Marital status: Married    Spouse name: Not on file   Number of children: Not on file   Years of education: Not on file   Highest education level: Master's degree (e.g., MA, MS, MEng, MEd, MSW, MBA)  Occupational History   Not on file  Tobacco Use   Smoking status: Never    Passive exposure: Never   Smokeless tobacco: Never  Vaping Use   Vaping status: Never Used  Substance and Sexual  Activity   Alcohol use: Never   Drug use: Never   Sexual activity: Not Currently  Other Topics Concern   Not on file  Social History Narrative   From [place], [place].    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   Retired.   [employer] [phone] through 20.  E5.  He had sig noise exposure that was service related.     Enjoys hiking, painting, Diplomatic Services operational officer, woodworking   [facility].   Social Determinants of Health   Financial Resource Strain: Low Risk  (06/25/2022)   Overall Financial Resource Strain (CARDIA)    Difficulty of Paying Living Expenses: Not hard at all  Food Insecurity: No Food Insecurity (06/25/2022)   Hunger Vital Sign    Worried About Running Out of Food in the Last Year: Never true    Ran Out of Food in the Last Year: Never true  Transportation Needs: No Transportation Needs (06/25/2022)   PRAPARE - Administrator, Civil Service (Medical): No    Lack of Transportation (Non-Medical): No  Physical Activity: Sufficiently Active (06/25/2022)   Exercise Vital Sign    Days of Exercise per Week: 4 days    Minutes of Exercise per Session: 40 min  Stress: No Stress Concern Present (06/25/2022)   [employer] of Occupational Health - Occupational Stress Questionnaire    Feeling of Stress : Not at all  Social Connections: Moderately Integrated (06/25/2022)   Social Connection and Isolation Panel [NHANES]    Frequency of Communication with Friends and Family: Once a week    Frequency of Social Gatherings with Friends and Family: Three times a week    Attends Religious Services: More than 4 times per year    Active Member of Clubs or Organizations: Patient declined    Attends Banker Meetings: Never    Marital Status: Married  Catering manager Violence: Not At Risk (11/05/2021)   Humiliation, Afraid, Rape, and Kick questionnaire    Fear of Current or Ex-Partner: No    Emotionally Abused: No     Physically Abused: No    Sexually Abused: No   Review of Systems No N/V No abdominal pain No back pain    Objective:   Physical Exam Constitutional:      Appearance: Normal appearance.  Abdominal:     Palpations: Abdomen is soft.     Tenderness: There is no abdominal tenderness.  Genitourinary:    Comments: Prostate boggy and enlarged but not tender (did make him have urge  to void) Neurological:     Mental Status: He is alert.            Assessment & Plan:

## 2022-10-14 NOTE — Addendum Note (Signed)
Addended by: Eual Fines on: 10/14/2022 12:33 PM   Modules accepted: Orders

## 2022-10-14 NOTE — Assessment & Plan Note (Signed)
Had leukocytes the last time--but not this time Nitrite negative as well I suspect this is more prostatitis than cystitis Will send culture again (Enterococcus last time). If grows again, will add amoxil Trial of doxycycline 10mg  bid x 10 days (with refill)

## 2022-10-15 ENCOUNTER — Encounter (INDEPENDENT_AMBULATORY_CARE_PROVIDER_SITE_OTHER): Payer: Self-pay

## 2022-10-15 LAB — URINE CULTURE
MICRO NUMBER:: 15329990
SPECIMEN QUALITY:: ADEQUATE

## 2022-11-04 ENCOUNTER — Telehealth: Payer: Self-pay | Admitting: Family Medicine

## 2022-11-04 NOTE — Telephone Encounter (Signed)
Patient wife Marjean Donna called in and stated that patient tested positive for covid and [place]. She stated that they were able to get him some Paxlovid over there and he will start taking it tonight. She stated that she just wanted to make Dr. Para March aware and if he had any questions to give her a call at [phone]. Thank you!

## 2022-11-04 NOTE — Telephone Encounter (Signed)
Appreciate the input.  It sounds like they are doing everything correctly and I hope he feels better soon.  Please update [place] as needed.  Thanks.

## 2022-11-15 ENCOUNTER — Other Ambulatory Visit: Payer: Self-pay | Admitting: Family Medicine

## 2022-11-15 DIAGNOSIS — Z8249 Family history of ischemic heart disease and other diseases of the circulatory system: Secondary | ICD-10-CM

## 2022-11-15 DIAGNOSIS — Z125 Encounter for screening for malignant neoplasm of prostate: Secondary | ICD-10-CM

## 2022-11-15 DIAGNOSIS — E039 Hypothyroidism, unspecified: Secondary | ICD-10-CM

## 2022-11-19 ENCOUNTER — Other Ambulatory Visit: Payer: Medicare Other

## 2022-11-19 ENCOUNTER — Encounter: Payer: Medicare Other | Admitting: Family Medicine

## 2022-11-24 ENCOUNTER — Other Ambulatory Visit (INDEPENDENT_AMBULATORY_CARE_PROVIDER_SITE_OTHER): Payer: Medicare Other

## 2022-11-24 DIAGNOSIS — E039 Hypothyroidism, unspecified: Secondary | ICD-10-CM

## 2022-11-24 DIAGNOSIS — Z8249 Family history of ischemic heart disease and other diseases of the circulatory system: Secondary | ICD-10-CM | POA: Diagnosis not present

## 2022-11-24 DIAGNOSIS — Z125 Encounter for screening for malignant neoplasm of prostate: Secondary | ICD-10-CM

## 2022-11-24 LAB — CBC WITH DIFFERENTIAL/PLATELET
Basophils Absolute: 0 10*3/uL (ref 0.0–0.1)
Basophils Relative: 0.8 % (ref 0.0–3.0)
Eosinophils Absolute: 0.1 10*3/uL (ref 0.0–0.7)
Eosinophils Relative: 2.4 % (ref 0.0–5.0)
HCT: 43.6 % (ref 39.0–52.0)
Hemoglobin: 14.4 g/dL (ref 13.0–17.0)
Lymphocytes Relative: 15.1 % (ref 12.0–46.0)
Lymphs Abs: 0.8 10*3/uL (ref 0.7–4.0)
MCHC: 33.1 g/dL (ref 30.0–36.0)
MCV: 89.7 fl (ref 78.0–100.0)
Monocytes Absolute: 0.6 10*3/uL (ref 0.1–1.0)
Monocytes Relative: 11.4 % (ref 3.0–12.0)
Neutro Abs: 3.8 10*3/uL (ref 1.4–7.7)
Neutrophils Relative %: 70.3 % (ref 43.0–77.0)
Platelets: 126 10*3/uL — ABNORMAL LOW (ref 150.0–400.0)
RBC: 4.86 Mil/uL (ref 4.22–5.81)
RDW: 14.1 % (ref 11.5–15.5)
WBC: 5.4 10*3/uL (ref 4.0–10.5)

## 2022-11-24 LAB — COMPREHENSIVE METABOLIC PANEL
ALT: 15 U/L (ref 0–53)
AST: 20 U/L (ref 0–37)
Albumin: 3.8 g/dL (ref 3.5–5.2)
Alkaline Phosphatase: 69 U/L (ref 39–117)
BUN: 22 mg/dL (ref 6–23)
CO2: 32 mEq/L (ref 19–32)
Calcium: 8.9 mg/dL (ref 8.4–10.5)
Chloride: 104 mEq/L (ref 96–112)
Creatinine, Ser: 1.21 mg/dL (ref 0.40–1.50)
GFR: 56.74 mL/min — ABNORMAL LOW (ref >60.00)
Glucose, Bld: 116 mg/dL — ABNORMAL HIGH (ref 70–99)
Potassium: 4 mEq/L (ref 3.5–5.1)
Sodium: 142 mEq/L (ref 135–145)
Total Bilirubin: 1.2 mg/dL (ref 0.2–1.2)
Total Protein: 6.1 g/dL (ref 6.0–8.3)

## 2022-11-24 LAB — TSH: TSH: 1.84 u[IU]/mL (ref 0.35–5.50)

## 2022-11-24 LAB — LIPID PANEL
Cholesterol: 129 mg/dL (ref 0–200)
HDL: 50.8 mg/dL (ref >39.00)
LDL Cholesterol: 60 mg/dL (ref 0–99)
NonHDL: 78.16
Total CHOL/HDL Ratio: 3
Triglycerides: 91 mg/dL (ref 0.0–149.0)
VLDL: 18.2 mg/dL (ref 0.0–40.0)

## 2022-11-24 LAB — PSA, MEDICARE: PSA: 4.61 ng/ml — ABNORMAL HIGH (ref 0.10–4.00)

## 2022-11-25 ENCOUNTER — Ambulatory Visit: Payer: Medicare Other | Admitting: Cardiology

## 2022-11-26 ENCOUNTER — Encounter: Payer: Self-pay | Admitting: Family Medicine

## 2022-11-26 ENCOUNTER — Ambulatory Visit (INDEPENDENT_AMBULATORY_CARE_PROVIDER_SITE_OTHER): Payer: Medicare Other | Admitting: Family Medicine

## 2022-11-26 VITALS — BP 136/80 | HR 57 | Temp 98.3°F | Ht 70.0 in | Wt 166.0 lb

## 2022-11-26 DIAGNOSIS — Z7189 Other specified counseling: Secondary | ICD-10-CM

## 2022-11-26 DIAGNOSIS — R251 Tremor, unspecified: Secondary | ICD-10-CM

## 2022-11-26 DIAGNOSIS — E039 Hypothyroidism, unspecified: Secondary | ICD-10-CM | POA: Diagnosis not present

## 2022-11-26 DIAGNOSIS — I480 Paroxysmal atrial fibrillation: Secondary | ICD-10-CM

## 2022-11-26 DIAGNOSIS — Z85819 Personal history of malignant neoplasm of unspecified site of lip, oral cavity, and pharynx: Secondary | ICD-10-CM | POA: Diagnosis not present

## 2022-11-26 DIAGNOSIS — Z0000 Encounter for general adult medical examination without abnormal findings: Secondary | ICD-10-CM

## 2022-11-26 DIAGNOSIS — U071 COVID-19: Secondary | ICD-10-CM

## 2022-11-26 NOTE — Patient Instructions (Addendum)
Don't change your meds for now.  Update me as needed.  You should get a call about seeing Dr. Arbutus Leas.  If you notice changes in the meantime then let me know.  Take care.  Glad to see you.  Flu shot later on.  Defer the covid vaccine for now.

## 2022-11-26 NOTE — Progress Notes (Signed)
I have personally reviewed the Medicare Annual Wellness questionnaire and have noted 1. The patient's medical and social history 2. Their use of alcohol, tobacco or illicit drugs 3. Their current medications and supplements 4. The patient's functional ability including ADL's, fall risks, home safety risks and hearing or visual             impairment. 5. Diet and physical activities 6. Evidence for depression or mood disorders  The patients weight, height, BMI have been recorded in the chart and visual acuity is per eye clinic.  I have made referrals, counseling and provided education to the patient based review of the above and I have provided the pt with a written personalized care plan for preventive services.  Provider list updated- see scanned forms.  Routine anticipatory guidance given to patient.  See health maintenance. The possibility exists that previously documented standard health maintenance information may have been brought forward from a previous encounter into this note.  If needed, that same information has been updated to reflect the current situation based on today's encounter.    Flu to be done later this fall.   Shingles previously done PNA previously done Tetanus 2020 RSV vaccine previously done. Covid deferred given recent infection.   Colon cancer screening deferred given his age.  He agrees. Prostate cancer screening 2024 Advance directive-wife designated if patient were incapacitated. Cognitive function addressed- see scanned forms- and if abnormal then additional documentation follows.   In addition to [facility] Wellness, follow up visit for the below conditions:  Hypothyroidism.  Compliant.  TSH wnl.  No neck mass.  Labs d/w pt.    H/o oral cancer, d/w pt.  Had CXR this summer.  No new oral lesions.    H/o PAF.  Monitoring pulse on watch with cards f/u pending.  He had a period with episodes detected the went weeks w/o sx. no chest pain.  He had covid  recently, improved with paxlovid, then had rebound.  Some residual cough that improves with SABA prn.    Occ tremor.  He noted some gait changes, some shortening of gait and forefoot strike.  He clearly had temporary worsening gait changes with covid and UTI but some changes predate those illnesses.  Handwriting is changing.  Swallowing well.  Normal smile.  FH parkinsons, father.  Occ resting R>L hand tremor, occ noted when trying to place an object.    PMH and SH reviewed  Meds, vitals, and allergies reviewed.   ROS: Per HPI.  Unless specifically indicated otherwise in HPI, the patient denies:  General: fever. Eyes: acute vision changes ENT: sore throat Cardiovascular: chest pain Respiratory: SOB GI: vomiting GU: dysuria Musculoskeletal: acute back pain Derm: acute rash Neuro: acute motor dysfunction Psych: worsening mood Endocrine: polydipsia Heme: bleeding Allergy: hayfever  GEN: nad, alert and oriented HEENT: mucous membranes moist NECK: supple w/o LA CV: rrr. PULM: ctab, no inc wob ABD: soft, +bs EXT: no edema SKIN: no acute rash Slightly shorter stride length but symmetric.  Occasional resting hand tremor noted.  Normal smile.

## 2022-11-29 DIAGNOSIS — U071 COVID-19: Secondary | ICD-10-CM | POA: Insufficient documentation

## 2022-11-29 DIAGNOSIS — Z0000 Encounter for general adult medical examination without abnormal findings: Secondary | ICD-10-CM | POA: Insufficient documentation

## 2022-11-29 DIAGNOSIS — R251 Tremor, unspecified: Secondary | ICD-10-CM | POA: Insufficient documentation

## 2022-11-29 NOTE — Assessment & Plan Note (Signed)
Chest x-ray was up-to-date this summer no new oral lesions.

## 2022-11-29 NOTE — Assessment & Plan Note (Signed)
With cardiology follow-up pending.  Will await cardiology note.

## 2022-11-29 NOTE — Assessment & Plan Note (Signed)
Advance directive- wife designated if patient were incapacitated.  

## 2022-11-29 NOTE — Assessment & Plan Note (Signed)
Both patient and wife had COVID concurrently.  He tested positive on 9//24.  He eventually tested negative on 11/19/2022.  He has improved in the meantime and he can use albuterol if needed.  Lungs are clear today.

## 2022-11-29 NOTE — Assessment & Plan Note (Addendum)
With family history of Parkinson's and I want neurology input.  Refer.  Discussed with patient.  He agrees.

## 2022-11-29 NOTE — Assessment & Plan Note (Signed)
Compliant.  TSH wnl.  No neck mass.  Labs d/w pt.   continue levothyroxine as is.

## 2022-11-29 NOTE — Assessment & Plan Note (Signed)
Flu to be done later this fall.   Shingles previously done PNA previously done Tetanus 2020 RSV vaccine previously done. Covid deferred given recent infection.   Colon cancer screening deferred given his age.  He agrees. Prostate cancer screening 2024 Advance directive-wife designated if patient were incapacitated.

## 2022-11-30 ENCOUNTER — Encounter: Payer: Self-pay | Admitting: Neurology

## 2022-12-21 NOTE — Progress Notes (Unsigned)
Assessment/Plan:   Parkinsonism  -Overall mild, and does not fully meet criteria for Parkinson's disease.  -Atypical state is in the differential, but he reports diplopia for over a decade, so this is likely unrelated.  -MRI brain will be done with and without gadolinium due to his history of diplopia and history of cancer.  -We will do DaT scan.  He does have some right leg tremor.  He reports right arm tremor, but I did not see that today.  -Skin biopsy for alpha-synuclein.  -Further recommendations to follow.   Subjective:   Norman Mitchell was seen today in the movement disorders clinic for neurologic consultation at the request of Joaquim Nam, MD.  The consultation is for the evaluation of gait change and to rule out Parkinson's. Pt with wife who supplements hx.   Medical records made available to me are reviewed.  Patient saw primary care for annual wellness visit on September 26.  Patient had some gait changes that worsened with COVID and urinary tract infection.  There was apparently some resting tremor noted and shortened stride length and he was sent here to rule out Parkinson's disease.  Tremor: Yes.     How long has it been going on? 2 years  At rest or with activation?  Activation, notes it when holding a fork and trying to "stab a grape" or something like that  Fam hx of tremor?  Yes.  ,  Father had ?parkinsons (unsure)  Located where?  R arm only  Affected by caffeine:  doesn't drink any  Affected by alcohol:  doesn't drink any  Affected by stress:  No.  Affected by fatigue: unknown  Spills soup if on spoon:  No. But notes the tremor  Spills glass of liquid if full:  No.  Affects ADL's (tying shoes, brushing teeth, etc):  No.  Tremor inducing meds:  No.  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    Acting out dreams:  No. Wet Pillows: Yes.   Postural  symptoms:  some days balance is great and other days not as good  Falls?  One fall about a week ago - was stepping lateral and foot caught floor and fell; didn't get hurt.  Only other fall was when went to sit on edge of bed and missed it and fell and also had COVID.  No other falls Bradykinesia symptoms: drooling while awake; little trouble getting up/down since he had covid in September (got deconditioned); isn't doing "heel - toe" walk well  Loss of smell:  Yes.   Due to radiation tx in the past (did get mostly better) Loss of taste:  Yes.   Urinary Incontinence:  No. Difficulty Swallowing:  No. Handwriting, micrographia: Yes.   Depression:  No. Memory changes:  some trouble with names but always had some trouble with this; no trouble with remembering to take pills; does bills without trouble Hallucinations:  No.  visual distortions: rare, since cataract sx N/V:  No. Lightheaded:  No.  Syncope: No. 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   Dyskinesia:  No. No hx of reglan/antipsychotic  Neuroimaging of the brain has previously been performed but this was in [place] 25 years ago for diplopia.    ALLERGIES:   Allergies  Allergen Reactions   Bactrim [Sulfamethoxazole-Trimethoprim] Hives  Ciprofloxacin Other (See Comments)    Leg tendonitis    Ibuprofen Other (See Comments)    Renal failure with high doses.    Levofloxacin Other (See Comments)    Severe leg tendonitis    CURRENT MEDICATIONS:  Current Outpatient Medications  Medication Instructions   albuterol (VENTOLIN HFA) 108 (90 Base) MCG/ACT inhaler INHALE ONE TO TWO PUFFS BY MOUTH EVERY 6 HOURS AS NEEDED   alfuzosin (UROXATRAL) 10 mg, Oral, Daily   aspirin EC (ASPIRIN LOW DOSE) 81 MG tablet TAKE 1 TABLET BY MOUTH DAILY SWALLOW WHOLE   CRANBERRY EXTRACT PO 500 mg, Oral, 3 times weekly   fluticasone (FLONASE) 50 MCG/ACT nasal spray 2 sprays, Each Nare, Daily    levothyroxine (SYNTHROID) 88 mcg, Oral, Daily   Lutein 20 MG CAPS Oral, Daily   Multiple Vitamins-Minerals (SENIOR MULTIVITAMIN PLUS PO) 1 tablet, Oral, Daily   NON FORMULARY Cocoavia 750 mg daily.    Omega-3 1000 MG CAPS Oral, 3 times a week   polyethylene glycol powder (GLYCOLAX/MIRALAX) 17 g, Oral, Every other day   sildenafil (VIAGRA) 100 mg, Oral, Daily PRN    Objective:   PHYSICAL EXAMINATION:    VITALS:   Vitals:   12/23/22 0959  BP: 120/78  Pulse: (!) 59  SpO2: 97%  Weight: 174 lb 3.2 oz (79 kg)  Height: 5\' 10"  (1.778 m)    GEN:  The patient appears stated age and is in NAD. HEENT:  Normocephalic, atraumatic.  The mucous membranes are moist. The superficial temporal arteries are without ropiness or tenderness. CV:  RRR Lungs:  CTAB Neck/HEME:  There are no carotid bruits bilaterally.  Neurological examination:  Orientation: The patient is alert and oriented x3.  Cranial nerves: There is good facial symmetry. There is facial hypomimia.   Extraocular muscles are intact. The visual fields are full to confrontational testing. The speech is fluent and clear. Soft palate rises symmetrically and there is no tongue deviation. Hearing is intact to conversational tone. Sensation: Sensation is intact to light touch throughout (facial, trunk, extremities). Vibration is intact at the bilateral big toe. There is no extinction with double simultaneous stimulation.  Motor: Strength is 5/5 in the bilateral upper and lower extremities.   Shoulder shrug is equal and symmetric.  There is no pronator drift. Deep tendon reflexes: Deep tendon reflexes are 2/4 at the bilateral biceps, triceps, brachioradialis, patella and achilles. Plantar responses are downgoing bilaterally.  Movement examination: Tone: There is nl tone in the bilateral upper extremities.  The tone in the lower extremities is nl.  Abnormal movements: there is rare R leg tremor.  It doesn't increase with distraction.    Coordination:  There is mild decremation with RAM's, with finger taps and toe taps on the right Gait and Station: The patient has minimal difficulty arising out of a deep-seated chair without the use of the hands. The patient's stride length is good, although he is slightly forward flexed.    I have reviewed and interpreted the following labs independently   Chemistry      Component Value Date/Time   NA 142 11/24/2022 0907   K 4.0 11/24/2022 0907   CL 104 11/24/2022 0907   CO2 32 11/24/2022 0907   BUN 22 11/24/2022 0907   CREATININE 1.21 11/24/2022 0907   CREATININE 1.25 11/28/2018 0000      Component Value Date/Time   CALCIUM 8.9 11/24/2022 0907   ALKPHOS 69 11/24/2022 0907   AST 20 11/24/2022 0907   AST 28  11/28/2018 0000   ALT 15 11/24/2022 0907   ALT 18 11/28/2018 0000   BILITOT 1.2 11/24/2022 0907      Lab Results  Component Value Date   TSH 1.84 11/24/2022   Lab Results  Component Value Date   WBC 5.4 11/24/2022   HGB 14.4 11/24/2022   HCT 43.6 11/24/2022   MCV 89.7 11/24/2022   PLT 126.0 (L) 11/24/2022      Total time spent on today's visit was 60 minutes, including both face-to-face time and nonface-to-face time.  Time included that spent on review of records (prior notes available to me/labs/imaging if pertinent), discussing treatment and goals, answering patient's questions and coordinating care.  Cc:  Joaquim Nam, MD

## 2022-12-23 ENCOUNTER — Ambulatory Visit: Payer: Medicare Other | Admitting: Neurology

## 2022-12-23 ENCOUNTER — Encounter: Payer: Self-pay | Admitting: Neurology

## 2022-12-23 VITALS — BP 120/78 | HR 59 | Ht 70.0 in | Wt 174.2 lb

## 2022-12-23 DIAGNOSIS — Q820 Hereditary lymphedema: Secondary | ICD-10-CM

## 2022-12-23 DIAGNOSIS — R251 Tremor, unspecified: Secondary | ICD-10-CM | POA: Diagnosis not present

## 2022-12-23 DIAGNOSIS — H532 Diplopia: Secondary | ICD-10-CM | POA: Diagnosis not present

## 2022-12-23 DIAGNOSIS — G20C Parkinsonism, unspecified: Secondary | ICD-10-CM

## 2022-12-23 NOTE — Patient Instructions (Signed)

## 2022-12-25 ENCOUNTER — Ambulatory Visit (INDEPENDENT_AMBULATORY_CARE_PROVIDER_SITE_OTHER): Payer: Medicare Other | Admitting: Family Medicine

## 2022-12-25 ENCOUNTER — Ambulatory Visit (INDEPENDENT_AMBULATORY_CARE_PROVIDER_SITE_OTHER)
Admission: RE | Admit: 2022-12-25 | Discharge: 2022-12-25 | Disposition: A | Discharge status: Home / Self Care | Payer: Medicare Other | Source: Ambulatory Visit | Attending: Family Medicine | Admitting: Family Medicine

## 2022-12-25 VITALS — BP 138/82 | HR 49 | Temp 98.9°F | Ht 70.0 in | Wt 174.5 lb

## 2022-12-25 DIAGNOSIS — R059 Cough, unspecified: Secondary | ICD-10-CM

## 2022-12-25 MED ORDER — PREDNISONE 20 MG PO TABS: ORAL_TABLET | ORAL | 0 refills | Status: DC

## 2022-12-25 NOTE — Patient Instructions (Signed)
CXR on the way out.   Prednisone with food. Take 2 a day for 5 days, then 1 a day for 5 days.  Start with 2 tonight then continue starting with breakfast on Saturday.   Albuterol as needed.  Take care.  Glad to see you.

## 2022-12-25 NOTE — Progress Notes (Unsigned)
Cough. Covid neg this AM.  He had covid this summer but never felt totally back to baseline.  This is similar to prev illness years ago- he had cough with changes with inspiration.  Used SABA prn, at night.  Used a dose in the middle of the night and got back to sleep.  No fevers.  Some occ sputum.  No ear or facial pain.  He can hear a wheeze vs extra noise laying down at night with exhalation, when laying on the L side.  Swallowing well.  No rash.    H/o bradycardia at baseline.   Neuro eval d/w pt, with DaT scan pending.    Meds, vitals, and allergies reviewed.   ROS: Per HPI unless specifically indicated in ROS section

## 2022-12-26 DIAGNOSIS — R059 Cough, unspecified: Secondary | ICD-10-CM | POA: Insufficient documentation

## 2022-12-26 NOTE — Assessment & Plan Note (Signed)
Chest x-ray today.  See notes on imaging.  Routine cautions given to patient.  Okay for outpatient follow-up. Take Prednisone with food. Take 2 a day for 5 days, then 1 a day for 5 days.  Start with 2 tonight then continue with breakfast on Saturday.   Albuterol as needed.  Update me as needed.  He agrees to plan.

## 2022-12-29 NOTE — Progress Notes (Unsigned)
Electrophysiology Office Follow up Visit Note:    Date:  12/30/2022   ID:  Norman Mitchell, DOB 09/30/1940, MRN [id number]  PCP:  Norman Nam, MD  [facility] Cardiologist:  Norman Odea, MD  [facility] Electrophysiologist:  Norman Prude, MD    Interval History:     Norman Mitchell is a 82 y.o. male who presents for a follow up visit.   Discussed the use of AI scribe software for clinical note transcription with the patient, who gave verbal consent to proceed.  History of Present Illness   [name], a patient with a known history of atrial fibrillation (AFib), presents for a follow-up visit. The patient's AFib episodes have been associated with alcohol use in the past. Currently, the patient is using an Apple Watch to monitor his heart rate and AFib episodes. He reports that the episodes predominantly occur at night. The patient has concerns about the accuracy of the Apple Watch in detecting AFib and has been experimenting with different settings on the device to better understand his condition. The patient is currently on aspirin for stroke prevention and has a CHADS-VASc score of 3. He expresses hesitation about starting anticoagulation therapy due to previous negative experiences with Eliquis.            Past medical, surgical, social and family history were reviewed.  ROS:   Please see the history of present illness.    All other systems reviewed and are negative.  EKGs/Labs/Other Studies Reviewed:    The following studies were reviewed today:  EKG Interpretation Date/Time:  Wednesday December 30 2022 11:24:54 EDT Ventricular Rate:  53 PR Interval:  168 QRS Duration:  152 QT Interval:  524 QTC Calculation: 491 R Axis:   -16  Text Interpretation: Sinus bradycardia with marked sinus arrhythmia Right bundle branch block Confirmed by Norman Mitchell [phone]) on 12/30/2022 11:26:21 AM    Physical Exam:    VS:  BP (!) 158/90 (BP Location: Left  Arm, Patient Position: Sitting, Cuff Size: Normal)   Pulse (!) 53   Ht 5\' 9"  (1.753 m)   Wt 172 lb 8 oz (78.2 kg)   SpO2 98%   BMI 25.47 kg/m     Wt Readings from Last 3 Encounters:  12/30/22 172 lb 8 oz (78.2 kg)  12/25/22 174 lb 8 oz (79.2 kg)  12/23/22 174 lb 3.2 oz (79 kg)     Physical Exam   GENERAL: Elderly male, appears younger than stated age CHEST: lungs clear to auscultation CARDIOVASCULAR: regular rhythm, no murmurs          ASSESSMENT:    1. Paroxysmal atrial fibrillation (HCC)   2. Essential hypertension   3. Venous insufficiency    PLAN:    In order of problems listed above:  Assessment and Plan    Atrial Fibrillation Predominantly nocturnal episodes as per Apple Watch monitoring. No current anticoagulation beyond aspirin 81mg  daily despite CHADS-VASc score of 3. Discussed the risk/benefit of anticoagulation with Eliquis, but patient remains hesitant due to prior bleeding experience. -Continue aspirin 81mg  daily. -Consider repeat discussion of anticoagulation at future visits.  Possible Sleep Apnea Nocturnal episodes of atrial fibrillation raise suspicion for undiagnosed sleep apnea. Prior sleep study was years ago. -Order home sleep study for further evaluation.   Hypertension Above goal today. He has failed prior antihypertensive medications due to orthostatic hypotension. He checks his BP at home. Continue routine PCP follow up. Follow-up in 1 year unless changes occur.  Signed, Norman Dunn, MD, [facility], [facility] 12/30/2022 11:46 AM    Electrophysiology [facility]

## 2022-12-30 ENCOUNTER — Encounter: Payer: Self-pay | Admitting: Cardiology

## 2022-12-30 ENCOUNTER — Ambulatory Visit: Payer: Medicare Other | Attending: Cardiology | Admitting: Cardiology

## 2022-12-30 ENCOUNTER — Telehealth: Payer: Self-pay

## 2022-12-30 VITALS — BP 158/90 | HR 53 | Ht 69.0 in | Wt 172.5 lb

## 2022-12-30 DIAGNOSIS — I872 Venous insufficiency (chronic) (peripheral): Secondary | ICD-10-CM | POA: Diagnosis present

## 2022-12-30 DIAGNOSIS — I10 Essential (primary) hypertension: Secondary | ICD-10-CM

## 2022-12-30 DIAGNOSIS — I480 Paroxysmal atrial fibrillation: Secondary | ICD-10-CM | POA: Diagnosis present

## 2022-12-30 NOTE — Patient Instructions (Signed)
Medication Instructions:  Your physician recommends that you continue on your current medications as directed. Please refer to the Current Medication list given to you today.  *If you need a refill on your cardiac medications before your next appointment, please call your pharmacy*  Testing/Procedures: Your physician has recommended that you have a sleep study. This test records several body functions during sleep, including: brain activity, eye movement, oxygen and carbon dioxide blood levels, heart rate and rhythm, breathing rate and rhythm, the flow of air through your mouth and nose, snoring, body muscle movements, and chest and belly movement.  Follow-Up: At [facility], you and your health needs are our priority.  As part of our continuing mission to provide you with exceptional heart care, we have created designated Provider Care Teams.  These Care Teams include your primary Cardiologist (physician) and Advanced Practice Providers (APPs -  Physician Assistants and Nurse Practitioners) who all work together to provide you with the care you need, when you need it.  Your next appointment:   1 year  Provider:   Sherie Don, NP

## 2022-12-30 NOTE — Telephone Encounter (Signed)
Itamar sleep study was ordered in clinic by Dr. Lalla Brothers.  Device (SN: [id number]) has been registered. Patient is aware not to open until we contact him with a pin#

## 2022-12-31 ENCOUNTER — Encounter: Payer: Self-pay | Admitting: Family Medicine

## 2023-01-08 ENCOUNTER — Encounter: Payer: Self-pay | Admitting: Neurology

## 2023-01-08 ENCOUNTER — Ambulatory Visit (INDEPENDENT_AMBULATORY_CARE_PROVIDER_SITE_OTHER): Payer: Medicare Other | Admitting: Neurology

## 2023-01-08 VITALS — BP 166/95 | HR 59 | Ht 69.0 in | Wt 174.0 lb

## 2023-01-08 DIAGNOSIS — G20C Parkinsonism, unspecified: Secondary | ICD-10-CM

## 2023-01-08 NOTE — Procedures (Signed)
Punch Biopsy Procedure Note  Preprocedure Diagnosis: Bradykinesia; Tremor;   Postprocedure Diagnosis: same  Locations: Site 1: Left posterior cervical Site 2: above left knee;  Site 3: above, left foot  Indications: r/o alpha synucleinopathy  Anesthesia: 3 mL Lidocaine 1% with epinephrine without added sodium bicarbonate  Procedure Details Patient informed of the risks (including but not limited to bleeding, pain, infection, scar and infection) and benefits of the procedure.  Informed consent obtained.  The areas which were chosen for biopsy, as above, and surrounding areas were given a sterile prep using betadyne and draped in the usual sterile fashion. The skin was then stretched perpendicular to the skin tension lines and sample removed using the 3 mm punch. Pressure applied, hemostasis achieved.   Dressing applied. The specimen(s) was sent for pathologic examination. The patient tolerated the procedure well.  Estimated Blood Loss: 0 ml  Condition: Stable  Complications: none.  Plan: 1. Instructed to keep the wound dry and covered for 24-48h and clean thereafter. 2. Warning signs of infection were reviewed.

## 2023-01-11 ENCOUNTER — Encounter: Payer: Self-pay | Admitting: Neurology

## 2023-01-12 ENCOUNTER — Encounter (HOSPITAL_COMMUNITY)
Admission: RE | Admit: 2023-01-12 | Discharge: 2023-01-12 | Disposition: A | Discharge status: Home / Self Care | Payer: Medicare Other | Source: Ambulatory Visit | Attending: Neurology | Admitting: Neurology

## 2023-01-12 DIAGNOSIS — R251 Tremor, unspecified: Secondary | ICD-10-CM | POA: Insufficient documentation

## 2023-01-12 MED ORDER — POTASSIUM IODIDE (ANTIDOTE) 130 MG PO TABS
ORAL_TABLET | ORAL | Status: AC
  Filled 2023-01-12: qty 1

## 2023-01-12 MED ORDER — IOFLUPANE I 123 185 MBQ/2.5ML IV SOLN
4.6800 | Freq: Once | INTRAVENOUS | Status: AC | PRN
  Administered 2023-01-12: 4.68 via INTRAVENOUS
  Filled 2023-01-12: qty 5

## 2023-01-14 ENCOUNTER — Other Ambulatory Visit: Payer: Medicare Other

## 2023-01-15 ENCOUNTER — Encounter: Payer: Self-pay | Admitting: Family Medicine

## 2023-01-19 ENCOUNTER — Ambulatory Visit
Admission: RE | Admit: 2023-01-19 | Discharge: 2023-01-19 | Disposition: A | Discharge status: Home / Self Care | Payer: Medicare Other | Source: Ambulatory Visit | Attending: Neurology | Admitting: Neurology

## 2023-01-19 DIAGNOSIS — G20C Parkinsonism, unspecified: Secondary | ICD-10-CM

## 2023-01-19 DIAGNOSIS — H532 Diplopia: Secondary | ICD-10-CM

## 2023-01-19 MED ORDER — GADOPICLENOL 0.5 MMOL/ML IV SOLN
7.5000 mL | Freq: Once | INTRAVENOUS | Status: AC | PRN
  Administered 2023-01-19: 7.5 mL via INTRAVENOUS

## 2023-01-21 ENCOUNTER — Telehealth: Payer: Self-pay | Admitting: *Deleted

## 2023-01-21 ENCOUNTER — Encounter: Payer: Self-pay | Admitting: Cardiology

## 2023-01-21 NOTE — Telephone Encounter (Signed)
Ordering provider: DR Lalla Brothers Associated diagnoses: I48.0-  I10.0 WatchPAT PA obtained on 01/21/2023 by Latrelle Dodrill, CMA. Authorization: No; tracking ID NO PA FOR HST Patient notified of PIN ([email or web address]) on 01/21/2023 via Notification Method: phone.

## 2023-01-26 ENCOUNTER — Encounter (INDEPENDENT_AMBULATORY_CARE_PROVIDER_SITE_OTHER): Payer: Medicare Other | Admitting: Cardiology

## 2023-01-26 DIAGNOSIS — R0683 Snoring: Secondary | ICD-10-CM

## 2023-01-26 DIAGNOSIS — I4819 Other persistent atrial fibrillation: Secondary | ICD-10-CM | POA: Diagnosis not present

## 2023-01-27 ENCOUNTER — Ambulatory Visit: Payer: Medicare Other | Attending: Cardiology

## 2023-01-27 DIAGNOSIS — I480 Paroxysmal atrial fibrillation: Secondary | ICD-10-CM

## 2023-01-27 DIAGNOSIS — I872 Venous insufficiency (chronic) (peripheral): Secondary | ICD-10-CM

## 2023-01-27 DIAGNOSIS — I10 Essential (primary) hypertension: Secondary | ICD-10-CM

## 2023-01-27 NOTE — Procedures (Signed)
   SLEEP STUDY REPORT Patient Information Study Date: 01/26/2023 Patient Name: Norman Mitchell Patient ID: [id number] Birth Date: Feb 13, 1941 Age: 82 Gender: Male BMI: 25.5 (W=172 lb, H=5' 9'') Referring Physician: Steffanie Dunn, MD  TEST DESCRIPTION: Home sleep apnea testing was completed using the WatchPat, a Type 1 device, utilizing peripheral arterial tonometry (PAT), chest movement, actigraphy, pulse oximetry, pulse rate, body position and snore. AHI was calculated with apnea and hypopnea using valid sleep time as the denominator. RDI includes apneas, hypopneas, and RERAs. The data acquired and the scoring of sleep and all associated events were performed in accordance with the recommended standards and specifications as outlined in the AASM Manual for the Scoring of Sleep and Associated Events 2.2.0 (2015).  FINDINGS: 1. No evidence of Obstructive Sleep Apnea with AHI 1.7/hr. 2. No Central Sleep Apnea. 3. Oxygen desaturations as low as 86%. 4. Mild snoring was present. O2 sats were < 88% for 0.3 minutes. 5. Total sleep time was 7 hrs and 13 min. 6. 13.7% of total sleep time was spent in REM sleep. 7. Shortened sleep onset latency at 6 min. 8. Shortened REM sleep onset latency at 45 min. 9. Total awakenings were 5. 10. Arryhthmia Detection: Possible Atrial fibrillation lasting 4 hours, 31 minutes and 33 seconds. This is not diagnostic of atrial fibrillation. Recommend further testing if clinically indicated.  DIAGNOSIS: Normal study with no significant sleep disordered breathing. Possible Atrial Fibrillation  RECOMMENDATIONS: 1. Normal study with no significant sleep disordered breathing.  2. Healthy sleep recommendations include: adequate nightly sleep (normal 7-9 hrs/night), avoidance of caffeine after noon and alcohol near bedtime, and maintaining a sleep environment that is cool, dark and quiet.  3. Weight loss for overweight patients is recommended.  4. Snoring  recommendations include: weight loss where appropriate, side sleeping, and avoidance of alcohol before bed.  5. Operation of motor vehicle or dangerous equipment must be avoided when feeling drowsy, excessively sleepy, or mentally fatigued.  6. An ENT consultation which may be useful for specific causes of and possible treatment of bothersome snoring .  7. Weight loss may be of benefit in reducing the severity of snoring.   Signature: Armanda Magic, MD; [facility]; Diplomat, American Board of Sleep Medicine Electronically Signed: 01/27/2023 11:07:09 PM

## 2023-02-01 ENCOUNTER — Telehealth: Payer: Self-pay

## 2023-02-01 NOTE — Telephone Encounter (Signed)
-----   Message from Armanda Magic sent at 01/27/2023 11:09 PM EST ----- Please let patient know that sleep study showed no significant sleep apnea.

## 2023-02-01 NOTE — Telephone Encounter (Signed)
 Notified patient of sleep study results and recommendations via VM, per DPR. Left callback number for questions and concerns.

## 2023-02-03 ENCOUNTER — Telehealth: Payer: Self-pay | Admitting: Neurology

## 2023-02-03 NOTE — Telephone Encounter (Signed)
Pts skin biopsy came back.  There was:  No evidence of alpha synuclein in the cutaneous nerves 2.  evidence of small fiber neuropathy 3.  No evidence of amyloid deposition within the cutaneous nerves  Please call pt/family and let them know that his skin biopsy was NEGATIVE for evidence of alpha synuclein, the hallmark pathologic protein seen in Parkinsons Disease.  We will discuss more at his appt next week

## 2023-02-03 NOTE — Telephone Encounter (Signed)
Called patient and he understood Dat Scan results but needed results for MRI calling reading room today

## 2023-02-08 ENCOUNTER — Encounter: Payer: Self-pay | Admitting: Family Medicine

## 2023-02-08 ENCOUNTER — Ambulatory Visit (INDEPENDENT_AMBULATORY_CARE_PROVIDER_SITE_OTHER): Payer: Medicare Other | Admitting: Family Medicine

## 2023-02-08 VITALS — BP 158/70 | HR 54 | Temp 98.0°F | Ht 69.0 in | Wt 177.4 lb

## 2023-02-08 DIAGNOSIS — R300 Dysuria: Secondary | ICD-10-CM

## 2023-02-08 MED ORDER — DOXYCYCLINE HYCLATE 100 MG PO TABS: 100.0000 mg | ORAL_TABLET | Freq: Two times a day (BID) | ORAL | 0 refills | Status: DC

## 2023-02-08 NOTE — Patient Instructions (Signed)
Collect a urine sample then start doxycycline.  Update me as needed.  Take care.  Glad to see you.

## 2023-02-08 NOTE — Progress Notes (Unsigned)
Most recent episode started about 1 month ago.  Already on uroxatral at baseline. Some mild dysuria.  Recently noted slower stream.  No fevers.    He had episodic leg pain, similar to the last time he had a UTI vs prostatitis.  L>R leg, usually below the knee.    He had h/o similar in 10/2022, improved with doxy at the time.    Noted inc hair loss in the last few weeks.  No patchy loss but has diffuse loss.    Walked 2 miles recently w/o claudication.    No rash, discharge or testicle pain.   Meds, vitals, and allergies reviewed.   ROS: Per HPI unless specifically indicated in ROS section   Leg not ttp now, no leg pain now.

## 2023-02-09 LAB — URINE CULTURE
MICRO NUMBER:: 15825820
Result:: NO GROWTH
SPECIMEN QUALITY:: ADEQUATE

## 2023-02-10 DIAGNOSIS — R300 Dysuria: Secondary | ICD-10-CM | POA: Insufficient documentation

## 2023-02-10 NOTE — Progress Notes (Signed)
Assessment/Plan:   Parkinsonism with R leg tremor (none seen today)  -He does not meet criteria for Parkinson's disease, and currently does not meet criteria for any atypical state  -DaTscan minimally positive.  DaTscan was reported to show evidence of symmetric decreased radiotracer in the left and right putamen.  This was mildly so.  -Skin biopsy for alpha-synuclein was negative  -Currently, there is nothing to do clinically but take a wait-and-see approach and watch for development of a clinical tauopathy state, but I don't see one today.  He has had diplopia for over a decade, so this is likely unrelated.  He would have had much more advanced disease if it was.  However, we had a long discussion about the possibilities.  He certainly has some parkinsonism.  He is very hypophonic.  He is not sure if some of this is from radiation in 2018, but he appears more hypophonic to me than he was last visit.  His wife thinks that this is getting worse and she was a Human resources officer by trade.  -Discussed medication, but ultimately decided to hold.  He has a sense of inner tremor.  -Having some restless leg symptoms, but he is not interested in medicine for that right now.  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  -Refer to speech therapy.   Subjective:   Norman Mitchell was seen today in follow up for testing results. Pt with wife who supplements hx.  Last visit, clinically, the patient did not meet criteria for Parkinsons disease.  He is about the same.  He has some RLS at night.  He doesn't want medication for that.  No falls.  He is walking for exercise.  DaTscan demonstrated mild symmetric decreased radiotracer activity in the left and right putamen.  Skin biopsy, however, was normal, without evidence of alpha-synuclein.  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  MRI brain demonstrated at least moderate atrophy and mild to moderate  white matter disease.  I personally reviewed it.    CURRENT MEDICATIONS:  Outpatient Encounter Medications as of 02/12/2023  Medication Sig   albuterol (VENTOLIN HFA) 108 (90 Base) MCG/ACT inhaler INHALE ONE TO TWO PUFFS BY MOUTH EVERY 6 HOURS AS NEEDED   alfuzosin (UROXATRAL) 10 MG 24 hr tablet TAKE ONE TABLET BY MOUTH DAILY   aspirin EC (ASPIRIN LOW DOSE) 81 MG tablet TAKE 1 TABLET BY MOUTH DAILY SWALLOW WHOLE   CRANBERRY EXTRACT PO Take 500 mg by mouth 3 (three) times a week.    doxycycline (VIBRA-TABS) 100 MG tablet Take 1 tablet (100 mg total) by mouth 2 (two) times daily.   fluticasone (FLONASE) 50 MCG/ACT nasal spray Place 2 sprays into both nostrils daily.   levothyroxine (SYNTHROID) 88 MCG tablet TAKE 1 TABLET BY MOUTH DAILY   Lutein 20 MG CAPS Take by mouth daily.   Multiple Vitamins-Minerals (SENIOR MULTIVITAMIN PLUS PO) Take 1 tablet by mouth daily.   NON FORMULARY Cocoavia 750 mg daily.   Omega-3 1000 MG CAPS Take by mouth. 3 times a week   polyethylene glycol powder (GLYCOLAX/MIRALAX) 17 GM/SCOOP powder Take 17 g by mouth every other day.   sildenafil (VIAGRA) 100 MG tablet Take 1 tablet (100 mg total) by mouth daily as needed for erectile dysfunction.   No facility-administered encounter medications on file as of 02/12/2023.     Objective:   PHYSICAL EXAMINATION:    VITALS:   Vitals:  02/12/23 0845  BP: 136/70  Pulse: (!) 50  SpO2: 98%  Weight: 177 lb (80.3 kg)  Height: 5\' 9"  (1.753 m)    GEN:  The patient appears stated age and is in NAD. HEENT:  Normocephalic, atraumatic.  The mucous membranes are moist. The superficial temporal arteries are without ropiness or tenderness. CV: Bradycardic.  Regular. Lungs:  CTAB Neck/HEME:  There are no carotid bruits bilaterally.  Neurological examination:  Orientation: The patient is alert and oriented x3. Cranial nerves: There is good facial symmetry. there was facial hypomimia.  The speech is fluent and quite  hypophonic. Soft palate rises symmetrically and there is no tongue deviation. Hearing is intact to conversational tone. Sensation: Sensation is intact to light touch throughout Motor: Strength is at least antigravity x4.  Movement examination: Tone: There is nl tone in the bilateral upper extremities.  The tone in the lower extremities is nl.  Abnormal movements: there is no leg tremor today Coordination:  There is no significant decremation today Gait and Station: The patient has no  difficulty arising out of a deep-seated chair without the use of the hands. The patient's stride length is good, although he is slightly forward flexed and mildly bradykinetic.  Arm swing is good.      Total time spent on today's visit was 31 minutes, including both face-to-face time and nonface-to-face time.  Time included that spent on review of records (prior notes available to me/labs/imaging if pertinent), discussing treatment and goals, answering patient's questions and coordinating care.  Cc:  Joaquim Nam, MD

## 2023-02-10 NOTE — Assessment & Plan Note (Signed)
Discussed urinalysis/culture and starting doxycycline given his history.  At this point still okay for outpatient follow-up.  He could have prostatitis versus cystitis.  He agrees with plan and he can update me as needed.   I do not see clear patchy hair loss and it is unclear to me if this is related to his other symptoms.  I think it makes sense to observe for now.

## 2023-02-12 ENCOUNTER — Ambulatory Visit (INDEPENDENT_AMBULATORY_CARE_PROVIDER_SITE_OTHER): Payer: Medicare Other | Admitting: Neurology

## 2023-02-12 ENCOUNTER — Encounter: Payer: Self-pay | Admitting: Neurology

## 2023-02-12 VITALS — BP 136/70 | HR 50 | Ht 69.0 in | Wt 177.0 lb

## 2023-02-12 DIAGNOSIS — R498 Other voice and resonance disorders: Secondary | ICD-10-CM | POA: Diagnosis not present

## 2023-02-12 DIAGNOSIS — G20C Parkinsonism, unspecified: Secondary | ICD-10-CM

## 2023-02-16 ENCOUNTER — Encounter: Payer: Self-pay | Admitting: Urology

## 2023-03-09 ENCOUNTER — Ambulatory Visit: Payer: Medicare Other | Attending: Neurology

## 2023-03-09 DIAGNOSIS — R498 Other voice and resonance disorders: Secondary | ICD-10-CM | POA: Diagnosis present

## 2023-03-09 DIAGNOSIS — G20C Parkinsonism, unspecified: Secondary | ICD-10-CM | POA: Insufficient documentation

## 2023-03-09 DIAGNOSIS — R471 Dysarthria and anarthria: Secondary | ICD-10-CM | POA: Insufficient documentation

## 2023-03-09 NOTE — Therapy (Signed)
 OUTPATIENT SPEECH LANGUAGE PATHOLOGY PARKINSON'S EVALUATION   Patient Name: Norman Mitchell MRN: [id number] DOB:07-02-1940, 83 y.o., male Today's Date: 03/09/2023  PCP: Arlyss Solian, MD  REFERRING PROVIDER: Asberry Schneider, DO   End of Session - 03/09/23 1548     Visit Number 1    Number of Visits 24    Date for SLP Re-Evaluation 06/01/23    SLP Start Time 1400    SLP Stop Time  1445    SLP Time Calculation (min) 45 min    Activity Tolerance Patient tolerated treatment well             Past Medical History:  Diagnosis Date   Arthritis    Hands, knee   Back pain    Complication of anesthesia    Was told after 1 surgery that his HR went really low.  Says his normal HR is 50-55.   DVT (deep venous thrombosis) (HCC) 2017   Bilateral knees.  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   Hypothyroidism    Oral cancer (HCC)    Status post radiation.   Paroxysmal atrial fibrillation (HCC)    Prostatitis    Scoliosis    Thrombocytopenia (HCC)    Venous insufficiency    legs   Past Surgical History:  Procedure Laterality Date   CATARACT EXTRACTION Bilateral    KNEE ARTHROSCOPY     KNEE ARTHROSCOPY WITH MEDIAL MENISECTOMY Right 01/09/2020   Procedure: RIGHT KNEE ARTHROSCOPY WITH MEDIAL MENISECTOMY;  Surgeon: Marchia Drivers, MD;  Location: [facility];  Service: Orthopedics;  Laterality: Right;   Oral biopsy     PILONIDAL CYST EXCISION     PROSTATE BIOPSY     Negative   SEPTOPLASTY     SHOULDER ARTHROSCOPY WITH ROTATOR CUFF REPAIR AND SUBACROMIAL DECOMPRESSION Right 07/02/2020   Procedure: RIGHT SHOULDER ARTHROSCOPY  DISTAL CLAVICLE EXCISION, BICEPS TENODESIS, SUBACROMIAL DECOMPRESSION, ANTERIOR LABRAL REPAIR. AND MINI OPEN ROTATOR CUFF REPAIR;  Surgeon: Krasinski, Kevin, MD;  Location: [facility];  Service: Orthopedics;  Laterality: Right;   TONSILLECTOMY     Patient Active Problem List   Diagnosis Date Noted   Dysuria 02/10/2023   Cough 12/26/2022   Medicare annual  wellness visit, subsequent 11/29/2022   Tremor 11/29/2022   GERD (gastroesophageal reflux disease) 03/15/2022   Constipation 12/30/2021   Irregular heart rhythm 11/05/2021   White coat syndrome with high blood pressure without hypertension 11/05/2021   Paresthesia 11/10/2020   Erectile dysfunction 11/10/2020   PSA elevation 11/10/2020   Osteoarthritis of right knee 06/21/2020   Synovitis of knee 06/21/2020   Partial thickness rotator cuff tear 06/21/2020   Knee pain 11/12/2019   Actinic keratosis 11/12/2019   Scoliosis 01/09/2019   Advance care planning 12/28/2018   Healthcare maintenance 12/28/2018   Venous insufficiency    Thrombocytopenia (HCC)    Prostatitis    Paroxysmal atrial fibrillation (HCC)    Hypothyroidism    History of oropharyngeal cancer 10/03/2018   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 He is 5 years out from treatment and is considered cured from cancer. I will follow up with him in 1 year for routine surveillance. 2. Dysphagia - Stable. 3. Thyroid  function-This is being followed by his PCP, but we offered to check this if it has not been checked recently. 4. Imaging -his PCP is followed up with a chest x-ray that was normal by his report 5. Carotid arteriosclerosis -he had a Doppler with his PCP that was normal. 6. Xerostomia-the patient has medical necessity  for dry mouth lozenges and Biotene that he takes on a regular basis. 7. Hoarseness - the patient's voice comes and goes but he does a lot of throat clearing. I did recommend that he try to minimize the throat clearing so that his voice is more stable.?   PAIN:  Are you having pain? No  FALLS: Has patient fallen in last 6 months?  No  LIVING ENVIRONMENT: Lives with: lives with their spouse Lives in: House/apartment  PLOF:  Level of assistance: Independent with ADLs Employment: Retired  PATIENT GOALS    to improve communication in a variety of settings  OBJECTIVE:  COGNITIVE COMMUNICATION Appeared WFL   MOTOR SPEECH: Overall motor speech: impaired Level of impairment: Word Respiration: thoracic breathing, clavicular breathing, and speaking on residual capacity Phonation: breathy, hoarse, and low vocal intensity Resonance: WFL Articulation: Appears intact Intelligibility: Intelligibility reduced Motor planning: Appears intact Effective technique: increased vocal intensity  ORAL MOTOR EXAMINATION WFL except occasional lip and jaw tremor  OBJECTIVE VOICE ASSESSMENT: Sustained ah maximum phonation time: 10 seconds Sustained ah loudness average: 66 dB Average fundamental frequency during sustained ah:107 Hz   (~2 SD below average of  145 Hz +/- 23 for gender)  Oral reading (passage) loudness average: 72 dB Oral reading (passage) pitch average: 113 Hz Oral reading loudness range: 63-79 dB Conversational pitch average: 120 Hz Conversational pitch range: 61-84 Hz Conversational loudness average: 72 dB Conversational loudness range: 61-84 dB Voice quality: hoarse, breathy, rough, strained, and low vocal intensity Stimulability trials: Given SLP modeling and usual max cues, loudness average increased to 76 dB at the sentence level.   Patient does not report difficulty swallowing which does not warrant further evaluation  PATIENT REPORTED OUTCOME MEASURES  (PROM):  The Communication Effectiveness Survey is a patient-reported outcome measure in which the patient rates their own effectiveness in different communication situations. A higher score indicates greater effectiveness.   Pt's self-rating was 19/32.   Having a conversation with a family member or friends at home. 2 Participating in conversation with strangers in a quiet place. 4- Very Effective Conversing with a familiar person over the telephone. 3 Conversing with a stranger over the telephone. 2 Being part of a conversation in a noisy environment (social gathering). 1- Not at all effective Speaking to a friend when you are emotionally upset or you are angry. 3 Having a conversation while traveling in a car. 3 Having a conversation with someone at a distance (across a room). 1- Not at all effective  TODAY'S TREATMENT:  Introduced science writer as well as higher education careers adviser. Education provided re: results of assessment, role of SLP, POC, changes to voice with Parkinsonism, and basic vocal hygiene. Further education to be provided in upcoming sessions.   PATIENT EDUCATION: Education details: as above Person educated: Patient Education method: Explanation Education comprehension: verbalized understanding and needs further education    GOALS: Goals reviewed with patient? Yes  SHORT TERM  GOALS: Target date: 10 sessions  The patient will demonstrate abdominal breathing patterns and steady release of breath on exhalation to optimize efficiency of voicing and decrease laryngeal hyperfunction with rate cues.   Baseline: Goal status: INITIAL  2.  The patient will complete HEP (Maximum duration ah, High/Lows, and Functional Phrases) at average loudness >/= 80 dB and with loud, good quality voice with min cues  Baseline:  Goal status: INITIAL  3.  The patient will complete Hierarchal Speech Loudness reading drills (words/phrases, sentences) at average >/= 75 dB and with  loud, good quality voice with min cues.  Baseline:  Goal status: INITIAL  4.  The patient will participate in 5-8 minutes conversation, maintaining average loudness of 75 dB and good quality voice with modified independence.  Baseline:  Goal status: INITIAL  5. The patient will demonstrate understanding of vocal hygiene concepts with min A. Baseline:  Goal status: INITIAL      LONG TERM GOALS: Target date: 12 weeks, 06/01/23  The patient will demonstrate abdominal breathing patterns and steady release of breath on exhalation to optimize efficiency of voicing and decrease laryngeal hyperfunction with rare cues.   Baseline: Goal status: INITIAL  The patient will complete HEP (Maximum duration ah, High/Lows, and Functional Phrases) at average loudness >/= 80 dB and with loud, good quality voice indep. Baseline:  Goal status: INITIAL    The patient will participate in 15-20 minutes conversation, maintaining average loudness of 75 dB and good quality voice with modified independence.  Baseline:  Goal status: INITIAL  4. Pt will report improvement in communication per PROM. Baseline: CES 19/32 Goal status: INITIAL  ASSESSMENT:  CLINICAL IMPRESSION: Patient is a 83 y.o. male who was seen today for voice/motor-speech evaluation in setting of Parkinsonism. 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 Pt presents with s/sx hypokinetic dysarthria c/b hypophonia, hoarse/strained, occasionally breathy voice. Pt with tendency to speak on residual capacity and utilize thoracic and clavicular breathing patterns. Pt stimulable to clear speech techniques (e.g. speak loud and slow). Pt with reports of dissatisfaction with ability to verbally communicate in social settings or at a distance from a communication partner. Recommend course of ST with an intensity based approach as well as additional education re: vocal  changes in Parkinsonism and vocal hygiene.  OBJECTIVE IMPAIRMENTS include voice disorder. These impairments are limiting patient from effectively communicating at home and in community. Factors affecting potential to achieve goals and functional outcome are co-morbidities. Patient will benefit from skilled SLP services to address above impairments and improve overall function.  REHAB POTENTIAL: Good  PLAN: SLP FREQUENCY: 1-2x/week  SLP DURATION: 12 weeks  PLANNED INTERVENTIONS: Environmental controls, Cueing hierachy, Internal/external aids, Functional tasks, Multimodal communication approach, SLP instruction and feedback, Compensatory strategies, and Patient/family education    Delon Bangs, M.S., CCC-SLP Speech-Language Pathologist [place] [facility] [phone] [place])  [facility] at [facility] [address] [place], [place], [address] Phone: [phone]   Fax:  [phone]

## 2023-03-11 ENCOUNTER — Ambulatory Visit: Payer: Medicare Other

## 2023-03-11 DIAGNOSIS — R498 Other voice and resonance disorders: Secondary | ICD-10-CM

## 2023-03-11 DIAGNOSIS — G20C Parkinsonism, unspecified: Secondary | ICD-10-CM

## 2023-03-11 DIAGNOSIS — R471 Dysarthria and anarthria: Secondary | ICD-10-CM

## 2023-03-11 NOTE — Therapy (Addendum)
 OUTPATIENT SPEECH LANGUAGE PATHOLOGY PARKINSON'S TREATMENT   Patient Name: Norman Mitchell MRN: [id number] DOB:[birth year], 83 y.o., male Today's Date: 03/11/2023  PCP: Arlyss Solian, MD  REFERRING PROVIDER: Asberry Schneider, DO   End of Session - 03/11/23 1624     Visit Number 2    Number of Visits 24    Date for SLP Re-Evaluation 06/01/23    SLP Start Time 1445    SLP Stop Time  1530    SLP Time Calculation (min) 45 min    Activity Tolerance Patient tolerated treatment well             Past Medical History:  Diagnosis Date   Arthritis    Hands, knee   Back pain    Complication of anesthesia    Was told after 1 surgery that his HR went really low.  Says his normal HR is 50-55.   DVT (deep venous thrombosis) (HCC) 2017   Bilateral knees.  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   Hypothyroidism    Oral cancer (HCC)    Status post radiation.   Paroxysmal atrial fibrillation (HCC)    Prostatitis    Scoliosis    Thrombocytopenia (HCC)    Venous insufficiency    legs   Past Surgical History:  Procedure Laterality Date   CATARACT EXTRACTION Bilateral    KNEE ARTHROSCOPY     KNEE ARTHROSCOPY WITH MEDIAL MENISECTOMY Right 01/09/2020   Procedure: RIGHT KNEE ARTHROSCOPY WITH MEDIAL MENISECTOMY;  Surgeon: Marchia Drivers, MD;  Location: [facility];  Service: Orthopedics;  Laterality: Right;   Oral biopsy     PILONIDAL CYST EXCISION     PROSTATE BIOPSY     Negative   SEPTOPLASTY     SHOULDER ARTHROSCOPY WITH ROTATOR CUFF REPAIR AND SUBACROMIAL DECOMPRESSION Right 07/02/2020   Procedure: RIGHT SHOULDER ARTHROSCOPY  DISTAL CLAVICLE EXCISION, BICEPS TENODESIS, SUBACROMIAL DECOMPRESSION, ANTERIOR LABRAL REPAIR. AND MINI OPEN ROTATOR CUFF REPAIR;  Surgeon: Krasinski, Kevin, MD;  Location: [facility];  Service: Orthopedics;  Laterality: Right;   TONSILLECTOMY     Patient Active Problem List   Diagnosis Date Noted   Dysuria 02/10/2023   Cough 12/26/2022   Medicare annual  wellness visit, subsequent 11/29/2022   Tremor 11/29/2022   GERD (gastroesophageal reflux disease) 03/15/2022   Constipation 12/30/2021   Irregular heart rhythm 11/05/2021   White coat syndrome with high blood pressure without hypertension 11/05/2021   Paresthesia 11/10/2020   Erectile dysfunction 11/10/2020   PSA elevation 11/10/2020   Osteoarthritis of right knee 06/21/2020   Synovitis of knee 06/21/2020   Partial thickness rotator cuff tear 06/21/2020   Knee pain 11/12/2019   Actinic keratosis 11/12/2019   Scoliosis 01/09/2019   Advance care planning 12/28/2018   Healthcare maintenance 12/28/2018   Venous insufficiency    Thrombocytopenia (HCC)    Prostatitis    Paroxysmal atrial fibrillation (HCC)    Hypothyroidism    History of oropharyngeal cancer 10/03/2018   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 He is 5 years out from treatment and is considered cured from cancer. I will follow up with him in 1 year for routine surveillance. 2. Dysphagia - Stable. 3. Thyroid  function-This is being followed by his PCP, but we offered to check this if it has not been checked recently. 4. Imaging -his PCP is followed up with a chest x-ray that was normal by his report 5. Carotid arteriosclerosis -he had a Doppler with his PCP that was normal. 6. Xerostomia-the patient has medical necessity  for dry mouth lozenges and Biotene that he takes on a regular basis. 7. Hoarseness - the patient's voice comes and goes but he does a lot of throat clearing. I did recommend that he try to minimize the throat clearing so that his voice is more stable.  PAIN:  Are you having pain? No  FALLS: Has patient fallen in last 6 months?  No  LIVING ENVIRONMENT: Lives with: lives with their spouse Lives in: House/apartment  PLOF:  Level of assistance: Independent with ADLs Employment: Retired  PATIENT GOALS    to improve communication in a variety of settings  OBJECTIVE:  TODAY'S TREATMENT:  Introduced HEP for hypophonia including hierarchical chief technology officer. Pt completed as follows: Me-May-My-Moe-Moo: averaged 80db, benefited from hum to elicit improved vocal quality/reduced strain Maximum duration ah - averaged ~5s at 73dB; prior to completed utilized Semi-Occluded Voice Therapy to elicit improved vocal quality Ascending pitch glides - averaged 74dB; prior to completed utilized Semi-Occluded Voice Therapy to elicit improved vocal quality Descending pitch glides: averaged 81 dB; prior to completed utilized Semi-Occluded Voice Therapy to elicit improved vocal quality Functional phrases - averaged 80 db; pt benefited from cues to read with intent as well as use of biofeedback with speech analyst app      Conversation training: Pt with reduced carryover into conversational speech. Pt verbalizing at ~76dB conversationally.       Education provided re: changes to voice in Parkinson's disease, rationale for HEP and speech drills, and SLP POC.    PATIENT EDUCATION: Education details: as above Person educated: Patient Education method: Explanation Education comprehension: verbalized understanding and needs further education    GOALS: Goals reviewed with patient? Yes  SHORT TERM GOALS: Target date: 10 sessions  The patient will demonstrate abdominal breathing patterns and steady release  of breath on exhalation to optimize efficiency of voicing and decrease laryngeal hyperfunction with rate cues.   Baseline: Goal status: INITIAL  2.  The patient will complete HEP (Maximum duration ah, High/Lows, and Functional Phrases) at average loudness >/= 80 dB and with loud, good quality voice with min cues  Baseline:  Goal status: INITIAL  3.  The patient will complete Hierarchal Speech Loudness reading drills (words/phrases, sentences) at average >/= 75 dB and with loud, good quality voice with min cues.  Baseline:  Goal status: INITIAL  4.  The patient will participate in 5-8 minutes conversation, maintaining average loudness of 75 dB and good quality voice with modified independence.  Baseline:  Goal status: INITIAL  5. The patient will demonstrate understanding of vocal hygiene concepts with min A. Baseline:  Goal status: INITIAL      LONG TERM GOALS: Target date: 12 weeks, 06/01/23  The patient will demonstrate abdominal breathing patterns and steady release of breath on exhalation to optimize efficiency of voicing and decrease laryngeal hyperfunction with rare cues.   Baseline: Goal status: INITIAL  The patient will complete HEP (Maximum duration  ah, High/Lows, and Functional Phrases) at average loudness >/= 80 dB and with loud, good quality voice indep. Baseline:  Goal status: INITIAL    The patient will participate in 15-20 minutes conversation, maintaining average loudness of 75 dB and good quality voice with modified independence.  Baseline:  Goal status: INITIAL  4. Pt will report improvement in communication per PROM. Baseline: CES 19/32 Goal status: INITIAL  ASSESSMENT:  CLINICAL IMPRESSION: Patient is a 84 y.o. male who was seen today for voice/motor-speech evaluation in setting of Parkinsonism. 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 Pt presents with s/sx  hypokinetic dysarthria c/b hypophonia, hoarse/strained, occasionally breathy voice. Pt with tendency to speak on residual capacity and utilize thoracic and clavicular breathing patterns. Pt stimulable to clear speech techniques (e.g. speak loud and slow). Pt with reports of dissatisfaction with ability to verbally communicate in social settings or at a distance from a communication partner. Recommend course of ST with an intensity based approach as well as additional education re: vocal changes in Parkinsonism and vocal hygiene.  OBJECTIVE IMPAIRMENTS include voice disorder. These impairments are limiting patient from effectively communicating at home and in community. Factors affecting potential to achieve goals and functional outcome are co-morbidities. Patient will benefit from skilled SLP services to address above impairments and improve overall function.  REHAB POTENTIAL: Good  PLAN: SLP FREQUENCY: 1-2x/week  SLP DURATION: 12 weeks  PLANNED INTERVENTIONS: Environmental controls, Cueing hierachy, Internal/external aids, Functional tasks, Multimodal communication approach, SLP instruction and feedback, Compensatory strategies, and Patient/family education    Delon Bangs, M.S., CCC-SLP Speech-Language Pathologist [facility] [phone] [place])  [facility] at [facility] [address] [place], [place], [address] Phone: [phone]   Fax:  [phone]

## 2023-03-16 ENCOUNTER — Ambulatory Visit: Payer: Medicare Other

## 2023-03-16 DIAGNOSIS — G20C Parkinsonism, unspecified: Secondary | ICD-10-CM | POA: Diagnosis not present

## 2023-03-16 DIAGNOSIS — R498 Other voice and resonance disorders: Secondary | ICD-10-CM

## 2023-03-16 DIAGNOSIS — R471 Dysarthria and anarthria: Secondary | ICD-10-CM

## 2023-03-16 NOTE — Therapy (Signed)
 OUTPATIENT SPEECH LANGUAGE PATHOLOGY [facility]'S TREATMENT   Patient Name: Norman Mitchell MRN: [id number] DOB:1941/02/18, 83 y.o., male Today's Date: 03/16/2023  PCP: Arlyss Solian, MD  REFERRING PROVIDER: Asberry Schneider, DO   End of Session - 03/16/23 1611     Visit Number 3    Number of Visits 24    Date for SLP Re-Evaluation 06/01/23    SLP Start Time 1515    SLP Stop Time  1610    SLP Time Calculation (min) 55 min    Activity Tolerance Patient tolerated treatment well             Past Medical History:  Diagnosis Date   Arthritis    Hands, knee   Back pain    Complication of anesthesia    Was told after 1 surgery that his HR went really low.  Says his normal HR is 50-55.   DVT (deep venous thrombosis) (HCC) 2017   Bilateral knees.  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   Hypothyroidism    Oral cancer (HCC)    Status post radiation.   Paroxysmal atrial fibrillation (HCC)    Prostatitis    Scoliosis    Thrombocytopenia (HCC)    Venous insufficiency    legs   Past Surgical History:  Procedure Laterality Date   CATARACT EXTRACTION Bilateral    KNEE ARTHROSCOPY     KNEE ARTHROSCOPY WITH MEDIAL MENISECTOMY Right 01/09/2020   Procedure: RIGHT KNEE ARTHROSCOPY WITH MEDIAL MENISECTOMY;  Surgeon: Marchia Drivers, MD;  Location: [facility];  Service: Orthopedics;  Laterality: Right;   Oral biopsy     PILONIDAL CYST EXCISION     PROSTATE BIOPSY     Negative   SEPTOPLASTY     SHOULDER ARTHROSCOPY WITH ROTATOR CUFF REPAIR AND SUBACROMIAL DECOMPRESSION Right 07/02/2020   Procedure: RIGHT SHOULDER ARTHROSCOPY  DISTAL CLAVICLE EXCISION, BICEPS TENODESIS, SUBACROMIAL DECOMPRESSION, ANTERIOR LABRAL REPAIR. AND MINI OPEN ROTATOR CUFF REPAIR;  Surgeon: Krasinski, Kevin, MD;  Location: [facility];  Service: Orthopedics;  Laterality: Right;   TONSILLECTOMY     Patient Active Problem List   Diagnosis Date Noted   Dysuria 02/10/2023   Cough 12/26/2022   Medicare annual  wellness visit, subsequent 11/29/2022   Tremor 11/29/2022   GERD (gastroesophageal reflux disease) 03/15/2022   Constipation 12/30/2021   Irregular heart rhythm 11/05/2021   White coat syndrome with high blood pressure without hypertension 11/05/2021   Paresthesia 11/10/2020   Erectile dysfunction 11/10/2020   PSA elevation 11/10/2020   Osteoarthritis of right knee 06/21/2020   Synovitis of knee 06/21/2020   Partial thickness rotator cuff tear 06/21/2020   Knee pain 11/12/2019   Actinic keratosis 11/12/2019   Scoliosis 01/09/2019   Advance care planning 12/28/2018   Healthcare maintenance 12/28/2018   Venous insufficiency    Thrombocytopenia (HCC)    Prostatitis    Paroxysmal atrial fibrillation (HCC)    Hypothyroidism    History of oropharyngeal cancer 10/03/2018   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 He is 5 years out from treatment and is considered cured from cancer. I will follow up with him in 1 year for routine surveillance. 2. Dysphagia - Stable. 3. Thyroid  function-This is being followed by his PCP, but we offered to check this if it has not been checked recently. 4. Imaging -his PCP is followed up with a chest x-ray that was normal by his report 5. Carotid arteriosclerosis -he had a Doppler with his PCP that was normal. 6. Xerostomia-the patient has medical necessity  for dry mouth lozenges and Biotene that he takes on a regular basis. 7. Hoarseness - the patient's voice comes and goes but he does a lot of throat clearing. I did recommend that he try to minimize the throat clearing so that his voice is more stable.  PAIN:  Are you having pain? No  FALLS: Has patient fallen in last 6 months?  No  LIVING ENVIRONMENT: Lives with: lives with their spouse Lives in: House/apartment  PLOF:  Level of assistance: Independent with ADLs Employment: Retired  PATIENT GOALS    to improve communication in a variety of settings  OBJECTIVE:  TODAY'S TREATMENT:  Introduced diaphragmatic and posture for improved breath support. Pt completed deep breathing ex's with emphasis on posture, breathing pattern, and slow/steady release of air with min verbal/visual cues cueing.  Reviewed HEP for hypophonia including hierarchical speech drills. Pt completed as follows: Me-May-My-Moe-Moo: averaged 83db Maximum duration ah - averaged ~5s at 80dB Ascending pitch glides - averaged 83dB Descending pitch glides: averaged 80 dB Functional phrases - averaged 80db; pt benefited from cues to read with intent as well as use of biofeedback with speech analyst app  Marked improvement in vocal intensity this date during drill work. Pt noted wife commented on improved loudness conversationally.      Conversation training: Pt with reduced carryover into conversational speech. Pt verbalizing at ~77dB during a 5 minute conversation.    PATIENT EDUCATION: Education details: as above Person educated: Patient Education method: Explanation Education comprehension: verbalized understanding and needs further education    GOALS: Goals reviewed with patient? Yes  SHORT TERM GOALS: Target date: 10 sessions  The patient will demonstrate abdominal breathing patterns and steady release of breath on exhalation to optimize efficiency of voicing and decrease laryngeal hyperfunction with  rate cues.   Baseline: Goal status: INITIAL  2.  The patient will complete HEP (Maximum duration ah, High/Lows, and Functional Phrases) at average loudness >/= 80 dB and with loud, good quality voice with min cues  Baseline:  Goal status: INITIAL  3.  The patient will complete Hierarchal Speech Loudness reading drills (words/phrases, sentences) at average >/= 75 dB and with loud, good quality voice with min cues.  Baseline:  Goal status: INITIAL  4.  The patient will participate in 5-8 minutes conversation, maintaining average loudness of 75 dB and good quality voice with modified independence.  Baseline:  Goal status: INITIAL  5. The patient will demonstrate understanding of vocal hygiene concepts with min A. Baseline:  Goal status: INITIAL      LONG TERM GOALS: Target date: 12 weeks, 06/01/23  The patient will demonstrate abdominal breathing patterns and steady release of breath on exhalation to optimize efficiency of voicing and decrease laryngeal hyperfunction with rare cues.   Baseline: Goal status: INITIAL  The patient will complete HEP (Maximum duration ah, High/Lows, and Functional Phrases) at average loudness >/= 80 dB and with loud, good quality voice  indep. Baseline:  Goal status: INITIAL    The patient will participate in 15-20 minutes conversation, maintaining average loudness of 75 dB and good quality voice with modified independence.  Baseline:  Goal status: INITIAL  4. Pt will report improvement in communication per PROM. Baseline: CES 19/32 Goal status: INITIAL  ASSESSMENT:  CLINICAL IMPRESSION: Patient is a 83 y.o. male who was seen today for voice/motor-speech treatment in setting of Parkinsonism. 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 Pt presents with s/sx hypokinetic dysarthria c/b hypophonia, hoarse/strained, occasionally breathy voice. Pt with tendency to  speak on residual capacity and utilize thoracic and clavicular breathing patterns. Pt with reports of dissatisfaction with ability to verbally communicate in social settings or at a distance from a communication partner. See details of tx session above. Recommend course of ST with an intensity based approach as well as additional education re: vocal changes in Parkinsonism and vocal hygiene.  OBJECTIVE IMPAIRMENTS include voice disorder. These impairments are limiting patient from effectively communicating at home and in community. Factors affecting potential to achieve goals and functional outcome are co-morbidities. Patient will benefit from skilled SLP services to address above impairments and improve overall function.  REHAB POTENTIAL: Good  PLAN: SLP FREQUENCY: 1-2x/week  SLP DURATION: 12 weeks  PLANNED INTERVENTIONS: Environmental controls, Cueing hierachy, Internal/external aids, Functional tasks, Multimodal communication approach, SLP instruction and feedback, Compensatory strategies, and Patient/family education    Delon Bangs, M.S., CCC-SLP Speech-Language Pathologist [facility] [phone] [place])  [facility] Outpatient Rehabilitation at [facility] [address] [place], [place], [address] Phone: [phone]   Fax:  [phone]

## 2023-03-18 ENCOUNTER — Ambulatory Visit: Payer: Medicare Other

## 2023-03-18 DIAGNOSIS — G20C Parkinsonism, unspecified: Secondary | ICD-10-CM | POA: Diagnosis not present

## 2023-03-18 DIAGNOSIS — R498 Other voice and resonance disorders: Secondary | ICD-10-CM

## 2023-03-18 DIAGNOSIS — R471 Dysarthria and anarthria: Secondary | ICD-10-CM

## 2023-03-18 NOTE — Therapy (Signed)
OUTPATIENT SPEECH LANGUAGE PATHOLOGY [facility] TREATMENT   Patient Name: Norman Mitchell MRN: [id number] DOB:1940-04-12, 83 y.o., male Today's Date: 03/18/2023  PCP: Crawford Givens, MD  REFERRING PROVIDER: Kerin Salen, DO   End of Session - 03/18/23 1348     SLP Start Time 1350    SLP Stop Time  1435    SLP Time Calculation (min) 45 min             Past Medical History:  Diagnosis Date   Arthritis    Hands, knee   Back pain    Complication of anesthesia    Was told after 1 surgery that his HR went "really low".  Says his normal HR is 50-55.   DVT (deep venous thrombosis) (HCC) 2017   Bilateral knees.  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   Hypothyroidism    Oral cancer (HCC)    Status post radiation.   Paroxysmal atrial fibrillation (HCC)    Prostatitis    Scoliosis    Thrombocytopenia (HCC)    Venous insufficiency    legs   Past Surgical History:  Procedure Laterality Date   CATARACT EXTRACTION Bilateral    KNEE ARTHROSCOPY     KNEE ARTHROSCOPY WITH MEDIAL MENISECTOMY Right 01/09/2020   Procedure: RIGHT KNEE ARTHROSCOPY WITH MEDIAL MENISECTOMY;  Surgeon: Juanell Fairly, MD;  Location: [facility];  Service: Orthopedics;  Laterality: Right;   Oral biopsy     PILONIDAL CYST EXCISION     PROSTATE BIOPSY     Negative   SEPTOPLASTY     SHOULDER ARTHROSCOPY WITH ROTATOR CUFF REPAIR AND SUBACROMIAL DECOMPRESSION Right 07/02/2020   Procedure: RIGHT SHOULDER ARTHROSCOPY  DISTAL CLAVICLE EXCISION, BICEPS TENODESIS, SUBACROMIAL DECOMPRESSION, ANTERIOR LABRAL REPAIR. AND MINI OPEN ROTATOR CUFF REPAIR;  Surgeon: Juanell Fairly, MD;  Location: [facility];  Service: Orthopedics;  Laterality: Right;   TONSILLECTOMY     Patient Active Problem List   Diagnosis Date Noted   Dysuria 02/10/2023   Cough 12/26/2022   Medicare annual wellness visit, subsequent 11/29/2022   Tremor 11/29/2022   GERD (gastroesophageal reflux disease) 03/15/2022   Constipation 12/30/2021    Irregular heart rhythm 11/05/2021   White coat syndrome with high blood pressure without hypertension 11/05/2021   Paresthesia 11/10/2020   Erectile dysfunction 11/10/2020   PSA elevation 11/10/2020   Osteoarthritis of right knee 06/21/2020   Synovitis of knee 06/21/2020   Partial thickness rotator cuff tear 06/21/2020   Knee pain 11/12/2019   Actinic keratosis 11/12/2019   Scoliosis 01/09/2019   Advance care planning 12/28/2018   Healthcare maintenance 12/28/2018   Venous insufficiency    Thrombocytopenia (HCC)    Prostatitis    Paroxysmal atrial fibrillation (HCC)    Hypothyroidism    History of oropharyngeal cancer 10/03/2018   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 He is 5 years out from treatment and is considered cured from cancer. I will follow up with him in 1 year  for routine surveillance. 2. Dysphagia - Stable. 3. Thyroid function-This is being followed by his PCP, but we offered to check this if it has not been checked recently. 4. Imaging -his PCP is followed up with a chest x-ray that was normal by his report 5. Carotid arteriosclerosis -he had a Doppler with his PCP that was normal. 6. Xerostomia-the patient has medical necessity for dry mouth lozenges and Biotene that he takes on a regular basis. 7. Hoarseness - the patient's voice comes and goes but he does a lot of  throat clearing. I did recommend that he try to minimize the throat clearing so that his voice is more stable."  PAIN:  Are you having pain? No  FALLS: Has patient fallen in last 6 months?  No  LIVING ENVIRONMENT: Lives with: lives with their spouse Lives in: House/apartment  PLOF:  Level of assistance: Independent with ADLs Employment: Retired  PATIENT GOALS    to improve communication in a variety of settings  OBJECTIVE:  TODAY'S TREATMENT:   Reviewed HEP for hypophonia including hierarchical speech drills. Pt completed as follows: Me-May-My-Moe-Moo: averaged 83db Maximum duration "ah" - averaged ~5s at 80dB Ascending pitch glides - averaged 83dB Descending pitch glides: averaged 80 dB Functional phrases - averaged 80db; pt benefited from cues to read with intent as well as use of biofeedback with speech analyst app Pt with increased vocal hoarseness/rough vocal quality this date. Utilize SOVT and resonant voice therapy to elicit a clearer voice with inconsistent effect.   Discussed POC with pt, will plan to incorporate EMST into upcoming sessions.     PATIENT EDUCATION: Education details: as above Person educated: Patient Education method: Explanation Education comprehension: verbalized understanding and needs further education    GOALS: Goals reviewed with patient? Yes  SHORT TERM GOALS: Target date: 10 sessions  The patient will demonstrate abdominal breathing patterns and steady release of breath on exhalation to optimize efficiency of voicing and decrease laryngeal hyperfunction with rate cues.   Baseline: Goal status: INITIAL  2.  The patient will complete HEP (Maximum duration "ah", High/Lows, and Functional Phrases) at average loudness >/= 80 dB and with loud, good quality voice with min cues  Baseline:  Goal status: INITIAL  3.  The patient will complete Hierarchal Speech Loudness reading drills (words/phrases, sentences) at average >/= 75 dB and with  loud, good quality voice with min cues.  Baseline:  Goal status: INITIAL  4.  The patient will participate in 5-8 minutes conversation, maintaining average loudness of 75 dB and good quality voice with modified independence.  Baseline:  Goal status: INITIAL  5. The patient will demonstrate understanding of vocal hygiene concepts with min A. Baseline:  Goal status: INITIAL      LONG TERM GOALS: Target date: 12 weeks, 06/01/23  The patient will demonstrate abdominal breathing patterns and steady release of breath on exhalation to optimize efficiency of voicing and decrease laryngeal hyperfunction with rare cues.   Baseline: Goal status: INITIAL  The patient will complete HEP (Maximum duration "ah", High/Lows, and Functional Phrases) at average loudness >/= 80 dB and with loud, good quality voice indep. Baseline:  Goal status: INITIAL    The patient will participate in 15-20 minutes conversation, maintaining average loudness of 75 dB and good quality voice with modified independence.  Baseline:  Goal status: INITIAL  4. Pt will report improvement in communication per PROM. Baseline: CES 19/32 Goal status: INITIAL  ASSESSMENT:  CLINICAL IMPRESSION: Patient is a 83 y.o. male  who was seen today for voice/motor-speech treatment in setting of Parkinsonism. 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 Pt presents with s/sx hypokinetic dysarthria c/b hypophonia, hoarse/strained, occasionally breathy voice. Pt with tendency to speak on residual capacity and utilize thoracic and clavicular breathing patterns. Pt with reports of dissatisfaction with ability to verbally communicate in social settings or at a distance from a communication partner. See details of tx session above. Recommend course of ST with an intensity based approach as well as additional education re: vocal changes in Parkinsonism and vocal  hygiene.  OBJECTIVE IMPAIRMENTS include voice disorder. These impairments are limiting patient from effectively communicating at home and in community. Factors affecting potential to achieve goals and functional outcome are co-morbidities. Patient will benefit from skilled SLP services to address above impairments and improve overall function.  REHAB POTENTIAL: Good  PLAN: SLP FREQUENCY: 1-2x/week  SLP DURATION: 12 weeks  PLANNED INTERVENTIONS: Environmental controls, Cueing hierachy, Internal/external aids, Functional tasks, Multimodal communication approach, SLP instruction and feedback, Compensatory strategies, and Patient/family education    Clyde Canterbury, M.S., CCC-SLP Speech-Language Pathologist [place] [facility] [phone] Arnette Felts)  [facility] Out[facility] at [facility] [address] [place], [place], [address] Phone: [phone]   Fax:  [phone]

## 2023-03-23 ENCOUNTER — Ambulatory Visit: Payer: Medicare Other

## 2023-03-23 DIAGNOSIS — R471 Dysarthria and anarthria: Secondary | ICD-10-CM

## 2023-03-23 DIAGNOSIS — G20C Parkinsonism, unspecified: Secondary | ICD-10-CM

## 2023-03-23 DIAGNOSIS — R498 Other voice and resonance disorders: Secondary | ICD-10-CM

## 2023-03-23 NOTE — Therapy (Signed)
OUTPATIENT SPEECH LANGUAGE PATHOLOGY [facility] TREATMENT   Patient Name: Norman Mitchell MRN: [id number] DOB:1940/04/03, 83 y.o., male Today's Date: 03/23/2023  PCP: Crawford Givens, MD  REFERRING PROVIDER: Kerin Salen, DO   End of Session - 03/23/23 1411     Visit Number 5    Number of Visits 24    Date for SLP Re-Evaluation 06/01/23    SLP Start Time 1315    SLP Stop Time  1400    SLP Time Calculation (min) 45 min    Activity Tolerance Patient tolerated treatment well             Past Medical History:  Diagnosis Date   Arthritis    Hands, knee   Back pain    Complication of anesthesia    Was told after 1 surgery that his HR went "really low".  Says his normal HR is 50-55.   DVT (deep venous thrombosis) (HCC) 2017   Bilateral knees.  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   Hypothyroidism    Oral cancer (HCC)    Status post radiation.   Paroxysmal atrial fibrillation (HCC)    Prostatitis    Scoliosis    Thrombocytopenia (HCC)    Venous insufficiency    legs   Past Surgical History:  Procedure Laterality Date   CATARACT EXTRACTION Bilateral    KNEE ARTHROSCOPY     KNEE ARTHROSCOPY WITH MEDIAL MENISECTOMY Right 01/09/2020   Procedure: RIGHT KNEE ARTHROSCOPY WITH MEDIAL MENISECTOMY;  Surgeon: Juanell Fairly, MD;  Location: [facility];  Service: Orthopedics;  Laterality: Right;   Oral biopsy     PILONIDAL CYST EXCISION     PROSTATE BIOPSY     Negative   SEPTOPLASTY     SHOULDER ARTHROSCOPY WITH ROTATOR CUFF REPAIR AND SUBACROMIAL DECOMPRESSION Right 07/02/2020   Procedure: RIGHT SHOULDER ARTHROSCOPY  DISTAL CLAVICLE EXCISION, BICEPS TENODESIS, SUBACROMIAL DECOMPRESSION, ANTERIOR LABRAL REPAIR. AND MINI OPEN ROTATOR CUFF REPAIR;  Surgeon: Juanell Fairly, MD;  Location: [facility];  Service: Orthopedics;  Laterality: Right;   TONSILLECTOMY     Patient Active Problem List   Diagnosis Date Noted   Dysuria 02/10/2023   Cough 12/26/2022   Medicare annual  wellness visit, subsequent 11/29/2022   Tremor 11/29/2022   GERD (gastroesophageal reflux disease) 03/15/2022   Constipation 12/30/2021   Irregular heart rhythm 11/05/2021   White coat syndrome with high blood pressure without hypertension 11/05/2021   Paresthesia 11/10/2020   Erectile dysfunction 11/10/2020   PSA elevation 11/10/2020   Osteoarthritis of right knee 06/21/2020   Synovitis of knee 06/21/2020   Partial thickness rotator cuff tear 06/21/2020   Knee pain 11/12/2019   Actinic keratosis 11/12/2019   Scoliosis 01/09/2019   Advance care planning 12/28/2018   Healthcare maintenance 12/28/2018   Venous insufficiency    Thrombocytopenia (HCC)    Prostatitis    Paroxysmal atrial fibrillation (HCC)    Hypothyroidism    History of oropharyngeal cancer 10/03/2018   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 He is 5 years out from treatment and is considered cured from cancer. I will follow up with him in 1 year for routine surveillance. 2. Dysphagia - Stable. 3. Thyroid function-This is being followed by his PCP, but we offered to check this if it has not been checked recently. 4. Imaging -his PCP is followed up with a chest x-ray that was normal by his report 5. Carotid arteriosclerosis -he had a Doppler with his PCP that was normal. 6. Xerostomia-the patient has medical necessity  for dry mouth lozenges and Biotene that he takes on a regular basis. 7. Hoarseness - the patient's voice comes and goes but he does a lot of throat clearing. I did recommend that he try to minimize the throat clearing so that his voice is more stable."  PAIN:  Are you having pain? No  FALLS: Has patient fallen in last 6 months?  No  LIVING ENVIRONMENT: Lives with: lives with their spouse Lives in: House/apartment  PLOF:  Level of assistance: Independent with ADLs Employment: Retired  PATIENT GOALS    to improve communication in a variety of settings  OBJECTIVE:  TODAY'S TREATMENT:  Introduced EMST for improve expiratory strength and endurance. Pt's MEP = 60 cmH2O. Training threshold 45 cmH2O. Pt completed 5 sets of x5 breaths with min verbal/visual cues. Additional education provided re: benefits and rationale of EMST. Pt to complete via HEP.  Reviewed HEP for hypophonia including hierarchical speech drills. Pt completed as follows: Me-May-My-Moe-Moo: averaged 83db Attempted sustained phonation; d/c'd due to hoarseness/strain  Phrase and sentence level training provided. Improved hoarseness with cues to speak louder. Pt averaging ~78 dB at the phrase/sentence level. Pt to complete via HEP.    PATIENT EDUCATION: Education details: as above Person educated: Patient Education method: Explanation Education comprehension: verbalized understanding and needs further education    GOALS: Goals reviewed with patient? Yes  SHORT TERM GOALS: Target date: 10 sessions  The patient will demonstrate abdominal breathing patterns and steady release of breath on exhalation to optimize efficiency of voicing and decrease laryngeal hyperfunction with rate cues.   Baseline: Goal status: INITIAL  2.  The patient will complete HEP (Maximum duration "ah", High/Lows, and Functional Phrases) at average loudness >/= 80 dB and with loud, good quality voice with min cues  Baseline:  Goal status:  INITIAL  3.  The patient will complete Hierarchal Speech Loudness reading drills (words/phrases, sentences) at average >/= 75 dB and with loud, good quality voice with min cues.  Baseline:  Goal status: INITIAL  4.  The patient will participate in 5-8 minutes conversation, maintaining average loudness of 75 dB and good quality voice with modified independence.  Baseline:  Goal status: INITIAL  5. The patient will demonstrate understanding of vocal hygiene concepts with min A. Baseline:  Goal status: INITIAL      LONG TERM GOALS: Target date: 12 weeks, 06/01/23  The patient will demonstrate abdominal breathing patterns and steady release of breath on exhalation to optimize efficiency of voicing and decrease laryngeal hyperfunction with rare cues.   Baseline: Goal status: INITIAL  The patient will complete HEP (Maximum duration "ah", High/Lows, and Functional Phrases) at average loudness >/= 80 dB and with loud, good quality voice indep. Baseline:  Goal status: INITIAL    The patient will participate in 15-20 minutes conversation, maintaining average loudness of 75 dB and good quality voice with modified independence.  Baseline:  Goal status: INITIAL  4.  Pt will report improvement in communication per PROM. Baseline: CES 19/32 Goal status: INITIAL  ASSESSMENT:  CLINICAL IMPRESSION: Patient is a 83 y.o. male who was seen today for voice/motor-speech treatment in setting of Parkinsonism. 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 Pt presents with s/sx hypokinetic dysarthria c/b hypophonia, hoarse/strained, occasionally breathy voice. Pt with tendency to speak on residual capacity and utilize thoracic and clavicular breathing patterns. Pt with reports of dissatisfaction with ability to verbally communicate in social settings or at a distance from a communication partner. See details of tx session  above. Recommend course of ST with an intensity based approach as well as additional education re: vocal changes in Parkinsonism and vocal hygiene.  OBJECTIVE IMPAIRMENTS include voice disorder. These impairments are limiting patient from effectively communicating at home and in community. Factors affecting potential to achieve goals and functional outcome are co-morbidities. Patient will benefit from skilled SLP services to address above impairments and improve overall function.  REHAB POTENTIAL: Good  PLAN: SLP FREQUENCY: 1-2x/week  SLP DURATION: 12 weeks  PLANNED INTERVENTIONS: Environmental controls, Cueing hierachy, Internal/external aids, Functional tasks, Multimodal communication approach, SLP instruction and feedback, Compensatory strategies, and Patient/family education    Clyde Canterbury, M.S., CCC-SLP Speech-Language Pathologist [place] [facility] [phone] Arnette Felts)  [facility] Outpatient Rehabilitation at [facility] [address] [place], [place], [address] Phone: [phone]   Fax:  [phone]

## 2023-03-24 NOTE — Progress Notes (Signed)
Samie Barclift T. Diona Peregoy, MD, CAQ Sports Medicine [facility] at [facility] [address] [place], [address]  Phone: [phone]  FAX: [phone]  Norman Mitchell - 83 y.o. male  MRN [id number]  Date of Birth: 06-Nov-1940  Date: 03/25/2023  PCP: Joaquim Nam, MD  Referral: Joaquim Nam, MD  Chief Complaint  Patient presents with   Leg Pain    Left Shin Splints   Subjective:   Norman Mitchell is a 83 y.o. very pleasant male patient with Body mass index is 26.29 kg/m. who presents with the following:  Patient presents with some ongoing left-sided leg pain.  He is a very nice patient who I recall well from prior office visits.  He has baseline is quite active and walks several miles a day.  Not sure what started it For several month, pain in the anterior lower leg on te left Getting in and out of the car will hurt it He has cut back on his walking volume, and he has been down to 1 mile a day for the last 2 weeks.  He does feel like this is getting better.  Pain is really more isolated to the soft tissue in the anterior shin on the left side.  Is getting better [facility] for exercise Slowed down some and cut back his distance  Icing his legs Used a knee brace a few weeks ago, maybe helped  Review of Systems is noted in the HPI, as appropriate  Objective:   BP (!) 160/90 (BP Location: Left Arm, Patient Position: Sitting, Cuff Size: Normal)   Pulse 62   Temp 98.7 F (37.1 C) (Temporal)   Ht 5\' 9"  (1.753 m)   Wt 178 lb (80.7 kg)   SpO2 98%   BMI 26.29 kg/m   GEN: No acute distress; alert,appropriate. PULM: Breathing comfortably in no respiratory distress PSYCH: Normally interactive.   Full range of motion at the knee on the left.  Stable to varus and valgus stressing ACL and PCL are intact.  No significant joint line tenderness.  Nontender along the course of the tibia, nontender at the fibular head, medial or lateral malleoli.  Does have  some mild tenderness in the tibialis anterior region. No significant tenderness along the medial or lateral aspects of the tibia  Laboratory and Imaging Data:  Assessment and Plan:     ICD-10-CM   1. Anterior tibialis tendinitis of left leg  M76.812      I think that this is more overuse and irritation of the tibialis anterior.  It is not really consistent with a medial tibial stress syndrome, and no tenderness adjacent to the tibia at all.  I am and have the patient do some basic reverse calf raises on a flat surface.  Recommended a compression stocking when walking and active.  I would anticipate that he is going to do fine.   Disposition: As needed  [employer] One speech-to-text software was used for transcription in this dictation.  Possible transcriptional errors can occur using Animal nutritionist.   Signed,  Elpidio Galea. Everlyn Farabaugh, MD   Outpatient Encounter Medications as of 03/25/2023  Medication Sig   albuterol (VENTOLIN HFA) 108 (90 Base) MCG/ACT inhaler INHALE ONE TO TWO PUFFS BY MOUTH EVERY 6 HOURS AS NEEDED   alfuzosin (UROXATRAL) 10 MG 24 hr tablet TAKE ONE TABLET BY MOUTH DAILY   aspirin EC (ASPIRIN LOW DOSE) 81 MG tablet TAKE 1 TABLET BY MOUTH DAILY SWALLOW WHOLE  CRANBERRY EXTRACT PO Take 500 mg by mouth 3 (three) times a week.    doxycycline (VIBRA-TABS) 100 MG tablet Take 1 tablet (100 mg total) by mouth 2 (two) times daily.   fluticasone (FLONASE) 50 MCG/ACT nasal spray Place 2 sprays into both nostrils daily.   levothyroxine (SYNTHROID) 88 MCG tablet TAKE 1 TABLET BY MOUTH DAILY   Lutein 20 MG CAPS Take by mouth daily.   Multiple Vitamins-Minerals (SENIOR MULTIVITAMIN PLUS PO) Take 1 tablet by mouth daily.   NON FORMULARY Cocoavia 750 mg daily.   Omega-3 1000 MG CAPS Take by mouth. 3 times a week   polyethylene glycol powder (GLYCOLAX/MIRALAX) 17 GM/SCOOP powder Take 17 g by mouth every other day.   sildenafil (VIAGRA) 100 MG tablet Take 1 tablet (100 mg  total) by mouth daily as needed for erectile dysfunction.   No facility-administered encounter medications on file as of 03/25/2023.

## 2023-03-25 ENCOUNTER — Encounter: Payer: Self-pay | Admitting: Family Medicine

## 2023-03-25 ENCOUNTER — Ambulatory Visit (INDEPENDENT_AMBULATORY_CARE_PROVIDER_SITE_OTHER): Payer: Medicare Other | Admitting: Family Medicine

## 2023-03-25 ENCOUNTER — Ambulatory Visit: Payer: Medicare Other

## 2023-03-25 VITALS — BP 160/90 | HR 62 | Temp 98.7°F | Ht 69.0 in | Wt 178.0 lb

## 2023-03-25 DIAGNOSIS — R498 Other voice and resonance disorders: Secondary | ICD-10-CM

## 2023-03-25 DIAGNOSIS — M76812 Anterior tibial syndrome, left leg: Secondary | ICD-10-CM | POA: Diagnosis not present

## 2023-03-25 DIAGNOSIS — G20C Parkinsonism, unspecified: Secondary | ICD-10-CM | POA: Diagnosis not present

## 2023-03-25 DIAGNOSIS — R471 Dysarthria and anarthria: Secondary | ICD-10-CM

## 2023-03-25 NOTE — Therapy (Signed)
OUTPATIENT SPEECH LANGUAGE PATHOLOGY P[facility]REAT[facility]   Patient Name: Norman Mitchell MRN: [id number] DOB:[birth year], 83 y.o., male Today's Date: 03/25/2023  PCP: Crawford Givens, MD  REFERRING PROVIDER: Kerin Salen, DO   End of Session - 03/25/23 1454     SLP Start Time 1355    SLP Stop Time  1445    SLP Time Calculation (min) 50 min             Past Medical History:  Diagnosis Date   Arthritis    Hands, knee   Back pain    Complication of anesthesia    Was told after 1 surgery that his HR went "really low".  Says his normal HR is 50-55.   DVT (deep venous thrombosis) (HCC) 2017   Bilateral knees.  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   Hypothyroidism    Oral cancer (HCC)    Status post radiation.   Paroxysmal atrial fibrillation (HCC)    Prostatitis    Scoliosis    Thrombocytopenia (HCC)    Venous insufficiency    legs   Past Surgical History:  Procedure Laterality Date   CATARACT EXTRACTION Bilateral    KNEE ARTHROSCOPY     KNEE ARTHROSCOPY WITH MEDIAL MENISECTOMY Right 01/09/2020   Procedure: RIGHT KNEE ARTHROSCOPY WITH MEDIAL MENISECTOMY;  Surgeon: Juanell Fairly, MD;  Location: [facility];  Service: Orthopedics;  Laterality: Right;   Oral biopsy     PILONIDAL CYST EXCISION     PROSTATE BIOPSY     Negative   SEPTOPLASTY     SHOULDER ARTHROSCOPY WITH ROTATOR CUFF REPAIR AND SUBACROMIAL DECOMPRESSION Right 07/02/2020   Procedure: RIGHT SHOULDER ARTHROSCOPY  DISTAL CLAVICLE EXCISION, BICEPS TENODESIS, SUBACROMIAL DECOMPRESSION, ANTERIOR LABRAL REPAIR. AND MINI OPEN ROTATOR CUFF REPAIR;  Surgeon: Juanell Fairly, MD;  Location: [facility];  Service: Orthopedics;  Laterality: Right;   TONSILLECTOMY     Patient Active Problem List   Diagnosis Date Noted   Medicare annual wellness visit, subsequent 11/29/2022   Tremor 11/29/2022   GERD (gastroesophageal reflux disease) 03/15/2022   Irregular heart rhythm 11/05/2021   White coat syndrome with high  blood pressure without hypertension 11/05/2021   Paresthesia 11/10/2020   Erectile dysfunction 11/10/2020   PSA elevation 11/10/2020   Osteoarthritis of right knee 06/21/2020   Synovitis of knee 06/21/2020   Partial thickness rotator cuff tear 06/21/2020   Knee pain 11/12/2019   Actinic keratosis 11/12/2019   Scoliosis 01/09/2019   Advance care planning 12/28/2018   Healthcare maintenance 12/28/2018   Venous insufficiency    Thrombocytopenia (HCC)    Prostatitis    Paroxysmal atrial fibrillation (HCC)    Hypothyroidism    History of oropharyngeal cancer 10/03/2018   Essential hypertension 08/12/2015   Palpitations 08/12/2015    ONSET DATE: 02/12/2023 (referral date)    REFERRING DIAG: Parkinsonism  THERAPY DIAG:  Primary parkinsonism (HCC)  Hypophonia  Dysarthria and anarthria  Rationale for Evaluation and Treatment Rehabilitation  SUBJECTIVE:   SUBJECTIVE STATEMENT: Pt alert, pleasant, and cooperative Pt accompanied by: self  PERTINENT HISTORY: as above  DIAGNOSTIC FINDINGS:  From ENT note, "1. 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 He is 5 years out from treatment and is considered cured from cancer. I will follow up with him in 1 year for routine surveillance. 2. Dysphagia - Stable. 3. Thyroid function-This is being followed by his PCP, but we offered to check this if it  has not been checked recently. 4. Imaging -his PCP is followed up with a chest x-ray that was normal by his report 5. Carotid arteriosclerosis -he had a Doppler with his PCP that was normal. 6. Xerostomia-the patient has medical necessity for dry mouth lozenges and Biotene that he takes on a regular basis. 7. Hoarseness - the patient's voice comes and goes but he does a lot of throat clearing. I did recommend that he try to minimize the throat clearing so that his voice is more stable."  PAIN:  Are you  having pain? No  FALLS: Has patient fallen in last 6 months?  No  LIVING ENVIRONMENT: Lives with: lives with their spouse Lives in: House/apartment  PLOF:  Level of assistance: Independent with ADLs Employment: Retired  PATIENT GOALS    to improve communication in a variety of settings  OBJECTIVE:  TODAY'S TREATMENT:  Reviewed EMST for improve expiratory strength and endurance. Training threshold 45 cmH2O. Pt completed 5 sets of x5 breaths with min verbal/visual cues. Additional education provided re: benefits and rationale of EMST. Pt to complete via HEP. New training threshold for next week set at 60 cmH2O.  Pt endorsed discomfort and pain with phonation. 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 Introduced Microbiologist. Pt completed following SLP demonstration. Pt endorsed tenderness particularly on R side of larynx near the thyroid cartilage.  Introduced Financial controller therapy. Pt able to ID hoarse voicing vs clear voicing (utilizing his "[name] voice)." Pt with difficulty maintaining clear voicing during informal exchanges without cueing, will continue to address in upcoming sessions.    PATIENT EDUCATION: Education details: as above Person educated: Patient Education method: Explanation Education comprehension: verbalized understanding and needs further education    GOALS: Goals reviewed with patient? Yes  SHORT TERM GOALS: Target date: 10 sessions  The patient will demonstrate abdominal breathing patterns and steady release of breath on exhalation to optimize efficiency of voicing and decrease laryngeal hyperfunction with rate cues.   Baseline: Goal status: INITIAL  2.  The patient will complete HEP (Maximum duration "ah", High/Lows, and Functional Phrases) at average loudness >/= 80 dB and with loud, good quality voice with min cues  Baseline:  Goal status: INITIAL  3.  The patient will complete Hierarchal Speech Loudness reading drills  (words/phrases, sentences) at average >/= 75 dB and with loud, good quality voice with min cues.  Baseline:  Goal status: INITIAL  4.  The patient will participate in 5-8 minutes conversation, maintaining average loudness of 75 dB and good quality voice with modified independence.  Baseline:  Goal status: INITIAL  5. The patient will demonstrate understanding of vocal hygiene concepts with min A. Baseline:  Goal status: INITIAL      LONG TERM GOALS: Target date: 12 weeks, 06/01/23  The patient will demonstrate abdominal breathing patterns and steady release of breath on exhalation to optimize efficiency of voicing and decrease laryngeal hyperfunction with rare cues.   Baseline: Goal status: INITIAL  The patient will complete HEP (Maximum duration "ah", High/Lows, and Functional Phrases) at average loudness >/= 80 dB and with loud, good quality voice indep. Baseline:  Goal status: INITIAL    The patient will participate in 15-20 minutes conversation, maintaining average loudness of 75 dB and good quality voice with modified independence.  Baseline:  Goal status: INITIAL  4. Pt will report improvement in communication per PROM. Baseline: CES 19/32 Goal status: INITIAL  ASSESSMENT:  CLINICAL IMPRESSION: Patient is a 83 y.o. male who  was seen today for voice/motor-speech treatment in setting of Parkinsonism. 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 Pt presents with s/sx hypokinetic dysarthria c/b hypophonia, hoarse/strained, occasionally breathy voice. Pt with tendency to speak on residual capacity and utilize thoracic and clavicular breathing patterns. Pt with reports of dissatisfaction with ability to verbally communicate in social settings or at a distance from a communication partner. See details of tx session above. Recommend course of ST with an intensity based approach as well as additional  education re: vocal changes in Parkinsonism and vocal hygiene.  OBJECTIVE IMPAIRMENTS include voice disorder. These impairments are limiting patient from effectively communicating at home and in community. Factors affecting potential to achieve goals and functional outcome are co-morbidities. Patient will benefit from skilled SLP services to address above impairments and improve overall function.  REHAB POTENTIAL: Good  PLAN: SLP FREQUENCY: 1-2x/week  SLP DURATION: 12 weeks  PLANNED INTERVENTIONS: Environmental controls, Cueing hierachy, Internal/external aids, Functional tasks, Multimodal communication approach, SLP instruction and feedback, Compensatory strategies, and Patient/family education    Clyde Canterbury, M.S., CCC-SLP Speech-Language Pathologist [place] [facility] [phone] Arnette Felts)  [facility] Outpatient Rehabilitation at [facility] [address] [place], [place], [address] Phone: [phone]   Fax:  [phone]

## 2023-03-26 ENCOUNTER — Encounter: Payer: Self-pay | Admitting: Family Medicine

## 2023-03-30 ENCOUNTER — Ambulatory Visit: Payer: Medicare Other

## 2023-03-30 DIAGNOSIS — R471 Dysarthria and anarthria: Secondary | ICD-10-CM

## 2023-03-30 DIAGNOSIS — G20C Parkinsonism, unspecified: Secondary | ICD-10-CM

## 2023-03-30 DIAGNOSIS — R498 Other voice and resonance disorders: Secondary | ICD-10-CM

## 2023-03-30 NOTE — Therapy (Signed)
OUTPATIENT SPEECH LANGUAGE PATHOLOGY [facility]REATMENT   Patient Name: Norman Mitchell MRN: [id number] DOB:12-13-1940, 83 y.o., male Today's Date: 03/30/2023  PCP: Crawford Givens, MD  REFERRING PROVIDER: Kerin Salen, DO   End of Session - 03/30/23 1310     Visit Number 7    Number of Visits 24    Date for SLP Re-Evaluation 06/01/23    SLP Start Time 1315    SLP Stop Time  1345    SLP Time Calculation (min) 30 min    Activity Tolerance Patient tolerated treatment well             Past Medical History:  Diagnosis Date   Arthritis    Hands, knee   Back pain    Complication of anesthesia    Was told after 1 surgery that his HR went "really low".  Says his normal HR is 50-55.   DVT (deep venous thrombosis) (HCC) 2017   Bilateral knees.  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   Hypothyroidism    Oral cancer (HCC)    Status post radiation.   Paroxysmal atrial fibrillation (HCC)    Prostatitis    Scoliosis    Thrombocytopenia (HCC)    Venous insufficiency    legs   Past Surgical History:  Procedure Laterality Date   CATARACT EXTRACTION Bilateral    KNEE ARTHROSCOPY     KNEE ARTHROSCOPY WITH MEDIAL MENISECTOMY Right 01/09/2020   Procedure: RIGHT KNEE ARTHROSCOPY WITH MEDIAL MENISECTOMY;  Surgeon: Juanell Fairly, MD;  Location: [facility];  Service: Orthopedics;  Laterality: Right;   Oral biopsy     PILONIDAL CYST EXCISION     PROSTATE BIOPSY     Negative   SEPTOPLASTY     SHOULDER ARTHROSCOPY WITH ROTATOR CUFF REPAIR AND SUBACROMIAL DECOMPRESSION Right 07/02/2020   Procedure: RIGHT SHOULDER ARTHROSCOPY  DISTAL CLAVICLE EXCISION, BICEPS TENODESIS, SUBACROMIAL DECOMPRESSION, ANTERIOR LABRAL REPAIR. AND MINI OPEN ROTATOR CUFF REPAIR;  Surgeon: Juanell Fairly, MD;  Location: [facility];  Service: Orthopedics;  Laterality: Right;   TONSILLECTOMY     Patient Active Problem List   Diagnosis Date Noted   Medicare annual wellness visit, subsequent 11/29/2022    Tremor 11/29/2022   GERD (gastroesophageal reflux disease) 03/15/2022   Irregular heart rhythm 11/05/2021   White coat syndrome with high blood pressure without hypertension 11/05/2021   Paresthesia 11/10/2020   Erectile dysfunction 11/10/2020   PSA elevation 11/10/2020   Osteoarthritis of right knee 06/21/2020   Synovitis of knee 06/21/2020   Partial thickness rotator cuff tear 06/21/2020   Knee pain 11/12/2019   Actinic keratosis 11/12/2019   Scoliosis 01/09/2019   Advance care planning 12/28/2018   Healthcare maintenance 12/28/2018   Venous insufficiency    Thrombocytopenia (HCC)    Prostatitis    Paroxysmal atrial fibrillation (HCC)    Hypothyroidism    History of oropharyngeal cancer 10/03/2018   Essential hypertension 08/12/2015   Palpitations 08/12/2015    ONSET DATE: 02/12/2023 (referral date)    REFERRING DIAG: Parkinsonism  THERAPY DIAG:  Primary parkinsonism (HCC)  Hypophonia  Dysarthria and anarthria  Rationale for Evaluation and Treatment Rehabilitation  SUBJECTIVE:   SUBJECTIVE STATEMENT: Pt alert, pleasant, and cooperative Pt accompanied by: self  PERTINENT HISTORY: as above  DIAGNOSTIC FINDINGS:  From ENT note, "1. 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 He is 5 years out from treatment and is considered cured from cancer. I will follow  up with him in 1 year for routine surveillance. 2. Dysphagia - Stable. 3. Thyroid function-This is being followed by his PCP, but we offered to check this if it has not been checked recently. 4. Imaging -his PCP is followed up with a chest x-ray that was normal by his report 5. Carotid arteriosclerosis -he had a Doppler with his PCP that was normal. 6. Xerostomia-the patient has medical necessity for dry mouth lozenges and Biotene that he takes on a regular basis. 7. Hoarseness - the patient's voice comes and goes but he does a  lot of throat clearing. I did recommend that he try to minimize the throat clearing so that his voice is more stable."  PAIN:  Are you having pain? No  FALLS: Has patient fallen in last 6 months?  No  LIVING ENVIRONMENT: Lives with: lives with their spouse Lives in: House/apartment  PLOF:  Level of assistance: Independent with ADLs Employment: Retired  PATIENT GOALS    to improve communication in a variety of settings  OBJECTIVE:  TODAY'S TREATMENT:  Reviewed EMST for improve expiratory strength and endurance. Training threshold 60 cmH2O. Pt completed 5 sets of x5 breaths with min verbal/visual cues. Additional education provided re: benefits and rationale of EMST. Pt to complete via HEP.   Reviewed circumlaryngeal massage. Pt completed following SLP review. Pt endorsed reduced tenderness this date with contined circumlaryngeal massage since last tx session.  Introduced Financial controller therapy. Pt able to ID hoarse voicing vs clear voicing (utilizing his "[name] voice)." Pt with difficulty maintaining clear voicing during informal exchanges without cueing. Pt benefited from cues for breath support and adequate positioning to improve loudness.    PATIENT EDUCATION: Education details: as above Person educated: Patient Education method: Explanation Education comprehension: verbalized understanding and needs further education    GOALS: Goals reviewed with patient? Yes  SHORT TERM GOALS: Target date: 10 sessions  The patient will demonstrate abdominal breathing patterns and steady release of breath on exhalation to optimize efficiency of voicing and decrease laryngeal hyperfunction with rate cues.   Baseline: Goal status: INITIAL  2.  The patient will complete HEP (Maximum duration "ah", High/Lows, and Functional Phrases) at average loudness >/= 80 dB and with loud, good quality voice with min cues  Baseline:  Goal status: INITIAL  3.  The patient will complete  Hierarchal Speech Loudness reading drills (words/phrases, sentences) at average >/= 75 dB and with loud, good quality voice with min cues.  Baseline:  Goal status: INITIAL  4.  The patient will participate in 5-8 minutes conversation, maintaining average loudness of 75 dB and good quality voice with modified independence.  Baseline:  Goal status: INITIAL  5. The patient will demonstrate understanding of vocal hygiene concepts with min A. Baseline:  Goal status: INITIAL      LONG TERM GOALS: Target date: 12 weeks, 06/01/23  The patient will demonstrate abdominal breathing patterns and steady release of breath on exhalation to optimize efficiency of voicing and decrease laryngeal hyperfunction with rare cues.   Baseline: Goal status: INITIAL  The patient will complete HEP (Maximum duration "ah", High/Lows, and Functional Phrases) at average loudness >/= 80 dB and with loud, good quality voice indep. Baseline:  Goal status: INITIAL    The patient will participate in 15-20 minutes conversation, maintaining average loudness of 75 dB and good quality voice with modified independence.  Baseline:  Goal status: INITIAL  4. Pt will report improvement in communication per PROM. Baseline: CES 19/32 Goal  status: INITIAL  ASSESSMENT:  CLINICAL IMPRESSION: Patient is a 83 y.o. male who was seen today for voice/motor-speech treatment in setting of Parkinsonism. 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 Pt presents with s/sx hypokinetic dysarthria c/b hypophonia, hoarse/strained, occasionally breathy voice. Pt with tendency to speak on residual capacity and utilize thoracic and clavicular breathing patterns. Pt with reports of dissatisfaction with ability to verbally communicate in social settings or at a distance from a communication partner. See details of tx session above. Recommend course of ST with an  intensity based approach as well as additional education re: vocal changes in Parkinsonism and vocal hygiene.  OBJECTIVE IMPAIRMENTS include voice disorder. These impairments are limiting patient from effectively communicating at home and in community. Factors affecting potential to achieve goals and functional outcome are co-morbidities. Patient will benefit from skilled SLP services to address above impairments and improve overall function.  REHAB POTENTIAL: Good  PLAN: SLP FREQUENCY: 1-2x/week  SLP DURATION: 12 weeks  PLANNED INTERVENTIONS: Environmental controls, Cueing hierachy, Internal/external aids, Functional tasks, Multimodal communication approach, SLP instruction and feedback, Compensatory strategies, and Patient/family education    Clyde Canterbury, M.S., CCC-SLP Speech-Language Pathologist [place] [facility] [phone] Arnette Felts)  [facility] Outpatient Rehabilitation at [facility] [address] [place], [place], [address] Phone: [phone]   Fax:  [phone]

## 2023-04-01 ENCOUNTER — Ambulatory Visit: Payer: Medicare Other

## 2023-04-01 DIAGNOSIS — G20C Parkinsonism, unspecified: Secondary | ICD-10-CM

## 2023-04-01 DIAGNOSIS — R498 Other voice and resonance disorders: Secondary | ICD-10-CM

## 2023-04-01 DIAGNOSIS — R471 Dysarthria and anarthria: Secondary | ICD-10-CM

## 2023-04-01 NOTE — Therapy (Signed)
OUTPATIENT SPEECH LANGUAGE PATHOLOGY [facility]   Patient Name: Norman Mitchell MRN: [id number] DOB:[birth year], 83 y.o., male Today's Date: 04/01/2023  PCP: Crawford Givens, MD  REFERRING PROVIDER: Kerin Salen, DO   End of Session - 04/01/23 1351     Visit Number 8    Number of Visits 24    Date for SLP Re-Evaluation 06/01/23    SLP Start Time 1355    SLP Stop Time  1425    SLP Time Calculation (min) 30 min             Past Medical History:  Diagnosis Date   Arthritis    Hands, knee   Back pain    Complication of anesthesia    Was told after 1 surgery that his HR went "really low".  Says his normal HR is 50-55.   DVT (deep venous thrombosis) (HCC) 2017   Bilateral knees.  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   Hypothyroidism    Oral cancer (HCC)    Status post radiation.   Paroxysmal atrial fibrillation (HCC)    Prostatitis    Scoliosis    Thrombocytopenia (HCC)    Venous insufficiency    legs   Past Surgical History:  Procedure Laterality Date   CATARACT EXTRACTION Bilateral    KNEE ARTHROSCOPY     KNEE ARTHROSCOPY WITH MEDIAL MENISECTOMY Right 01/09/2020   Procedure: RIGHT KNEE ARTHROSCOPY WITH MEDIAL MENISECTOMY;  Surgeon: Juanell Fairly, MD;  Location: [facility];  Service: Orthopedics;  Laterality: Right;   Oral biopsy     PILONIDAL CYST EXCISION     PROSTATE BIOPSY     Negative   SEPTOPLASTY     SHOULDER ARTHROSCOPY WITH ROTATOR CUFF REPAIR AND SUBACROMIAL DECOMPRESSION Right 07/02/2020   Procedure: RIGHT SHOULDER ARTHROSCOPY  DISTAL CLAVICLE EXCISION, BICEPS TENODESIS, SUBACROMIAL DECOMPRESSION, ANTERIOR LABRAL REPAIR. AND MINI OPEN ROTATOR CUFF REPAIR;  Surgeon: Juanell Fairly, MD;  Location: [facility];  Service: Orthopedics;  Laterality: Right;   TONSILLECTOMY     Patient Active Problem List   Diagnosis Date Noted   Medicare annual wellness visit, subsequent 11/29/2022   Tremor 11/29/2022   GERD (gastroesophageal reflux  disease) 03/15/2022   Irregular heart rhythm 11/05/2021   White coat syndrome with high blood pressure without hypertension 11/05/2021   Paresthesia 11/10/2020   Erectile dysfunction 11/10/2020   PSA elevation 11/10/2020   Osteoarthritis of right knee 06/21/2020   Synovitis of knee 06/21/2020   Partial thickness rotator cuff tear 06/21/2020   Knee pain 11/12/2019   Actinic keratosis 11/12/2019   Scoliosis 01/09/2019   Advance care planning 12/28/2018   Healthcare maintenance 12/28/2018   Venous insufficiency    Thrombocytopenia (HCC)    Prostatitis    Paroxysmal atrial fibrillation (HCC)    Hypothyroidism    History of oropharyngeal cancer 10/03/2018   Essential hypertension 08/12/2015   Palpitations 08/12/2015    ONSET DATE: 02/12/2023 (referral date)    REFERRING DIAG: Parkinsonism  THERAPY DIAG:  Primary parkinsonism (HCC)  Hypophonia  Dysarthria and anarthria  Rationale for Evaluation and Treatment Rehabilitation  SUBJECTIVE:   SUBJECTIVE STATEMENT: Pt alert, pleasant, and cooperative Pt accompanied by: self  PERTINENT HISTORY: as above  DIAGNOSTIC FINDINGS:  From ENT note, "1. 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 He is 5 years out from treatment and is considered cured from cancer. I will follow up with him in 1 year for routine surveillance.  2. Dysphagia - Stable. 3. Thyroid function-This is being followed by his PCP, but we offered to check this if it has not been checked recently. 4. Imaging -his PCP is followed up with a chest x-ray that was normal by his report 5. Carotid arteriosclerosis -he had a Doppler with his PCP that was normal. 6. Xerostomia-the patient has medical necessity for dry mouth lozenges and Biotene that he takes on a regular basis. 7. Hoarseness - the patient's voice comes and goes but he does a lot of throat clearing. I did recommend that he try  to minimize the throat clearing so that his voice is more stable."  PAIN:  Are you having pain? No  FALLS: Has patient fallen in last 6 months?  No  LIVING ENVIRONMENT: Lives with: lives with their spouse Lives in: House/apartment  PLOF:  Level of assistance: Independent with ADLs Employment: Retired  PATIENT GOALS    to improve communication in a variety of settings  OBJECTIVE:  TODAY'S TREATMENT:  Reviewed EMST for improve expiratory strength and endurance. Training threshold 67.5 cmH2O. Pt completed 5 sets of x5 breaths with min verbal/visual cues. Additional education provided re: benefits and rationale of EMST. Pt to complete via HEP.   Reviewed possible changes to voice following radiation tx to head/neck. Reviewed circumlaryngeal massage and introduced gentle neck stretching.  Reviewed conversation training therapy. Pt able to ID hoarse voicing vs clear voicing (utilizing his "[name] voice)." Pt with tendency to speak at a lower volume which results in additional hoarseness. Biofeedback was provided with use of Voce Analyst app with adequate vocal intensity (~75 dB) and improved vocal quality.    PATIENT EDUCATION: Education details: as above Person educated: Patient Education method: Explanation Education comprehension: verbalized understanding and needs further education    GOALS: Goals reviewed with patient? Yes  SHORT TERM GOALS: Target date: 10 sessions  The patient will demonstrate abdominal breathing patterns and steady release of breath on exhalation to optimize efficiency of voicing and decrease laryngeal hyperfunction with rate cues.   Baseline: Goal status: INITIAL  2.  The patient will complete HEP (Maximum duration "ah", High/Lows, and Functional Phrases) at average loudness >/= 80 dB and with loud, good quality voice with min cues  Baseline:  Goal status: INITIAL  3.  The patient will complete Hierarchal Speech Loudness reading drills  (words/phrases, sentences) at average >/= 75 dB and with loud, good quality voice with min cues.  Baseline:  Goal status: INITIAL  4.  The patient will participate in 5-8 minutes conversation, maintaining average loudness of 75 dB and good quality voice with modified independence.  Baseline:  Goal status: INITIAL  5. The patient will demonstrate understanding of vocal hygiene concepts with min A. Baseline:  Goal status: INITIAL      LONG TERM GOALS: Target date: 12 weeks, 06/01/23  The patient will demonstrate abdominal breathing patterns and steady release of breath on exhalation to optimize efficiency of voicing and decrease laryngeal hyperfunction with rare cues.   Baseline: Goal status: INITIAL  The patient will complete HEP (Maximum duration "ah", High/Lows, and Functional Phrases) at average loudness >/= 80 dB and with loud, good quality voice indep. Baseline:  Goal status: INITIAL    The patient will participate in 15-20 minutes conversation, maintaining average loudness of 75 dB and good quality voice with modified independence.  Baseline:  Goal status: INITIAL  4. Pt will report improvement in communication per PROM. Baseline: CES 19/32 Goal status: INITIAL  ASSESSMENT:  CLINICAL IMPRESSION: Patient is a 83 y.o. male who was seen today for voice/motor-speech treatment in setting of Parkinsonism. 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 Pt presents with s/sx hypokinetic dysarthria c/b hypophonia, hoarse/strained, occasionally breathy voice. Pt with tendency to speak on residual capacity and utilize thoracic and clavicular breathing patterns. Pt with reports of dissatisfaction with ability to verbally communicate in social settings or at a distance from a communication partner. See details of tx session above. Recommend course of ST with an intensity based approach as well as additional  education re: vocal changes in Parkinsonism and vocal hygiene.  OBJECTIVE IMPAIRMENTS include voice disorder. These impairments are limiting patient from effectively communicating at home and in community. Factors affecting potential to achieve goals and functional outcome are co-morbidities. Patient will benefit from skilled SLP services to address above impairments and improve overall function.  REHAB POTENTIAL: Good  PLAN: SLP FREQUENCY: 1-2x/week  SLP DURATION: 12 weeks  PLANNED INTERVENTIONS: Environmental controls, Cueing hierachy, Internal/external aids, Functional tasks, Multimodal communication approach, SLP instruction and feedback, Compensatory strategies, and Patient/family education    Clyde Canterbury, M.S., CCC-SLP Speech-Language Pathologist [place] [facility] [phone] Arnette Felts)  [facility] at [facility] [address] [place], [place], [address] Phone: [phone]   Fax:  [phone]

## 2023-04-06 ENCOUNTER — Ambulatory Visit: Payer: Medicare Other | Attending: Neurology

## 2023-04-06 DIAGNOSIS — G20C Parkinsonism, unspecified: Secondary | ICD-10-CM | POA: Insufficient documentation

## 2023-04-06 DIAGNOSIS — R471 Dysarthria and anarthria: Secondary | ICD-10-CM | POA: Insufficient documentation

## 2023-04-06 DIAGNOSIS — R498 Other voice and resonance disorders: Secondary | ICD-10-CM | POA: Insufficient documentation

## 2023-04-06 NOTE — Therapy (Signed)
 OUTPATIENT SPEECH LANGUAGE PATHOLOGY [facility]   Patient Name: Norman Mitchell MRN: [id number] DOB:04/25/1940, 83 y.o., male Today's Date: 04/06/2023  PCP: Arlyss Solian, MD  REFERRING PROVIDER: Asberry Schneider, DO   End of Session - 04/06/23 1302     Visit Number 9    Number of Visits 24    Date for SLP Re-Evaluation 06/01/23    SLP Start Time 1305    SLP Stop Time  1400    SLP Time Calculation (min) 55 min    Activity Tolerance Patient tolerated treatment well             Past Medical History:  Diagnosis Date   Arthritis    Hands, knee   Back pain    Complication of anesthesia    Was told after 1 surgery that his HR went really low.  Says his normal HR is 50-55.   DVT (deep venous thrombosis) (HCC) 2017   Bilateral knees.  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   Hypothyroidism    Oral cancer (HCC)    Status post radiation.   Paroxysmal atrial fibrillation (HCC)    Prostatitis    Scoliosis    Thrombocytopenia (HCC)    Venous insufficiency    legs   Past Surgical History:  Procedure Laterality Date   CATARACT EXTRACTION Bilateral    KNEE ARTHROSCOPY     KNEE ARTHROSCOPY WITH MEDIAL MENISECTOMY Right 01/09/2020   Procedure: RIGHT KNEE ARTHROSCOPY WITH MEDIAL MENISECTOMY;  Surgeon: Marchia Drivers, MD;  Location: [facility];  Service: Orthopedics;  Laterality: Right;   Oral biopsy     PILONIDAL CYST EXCISION     PROSTATE BIOPSY     Negative   SEPTOPLASTY     SHOULDER ARTHROSCOPY WITH ROTATOR CUFF REPAIR AND SUBACROMIAL DECOMPRESSION Right 07/02/2020   Procedure: RIGHT SHOULDER ARTHROSCOPY  DISTAL CLAVICLE EXCISION, BICEPS TENODESIS, SUBACROMIAL DECOMPRESSION, ANTERIOR LABRAL REPAIR. AND MINI OPEN ROTATOR CUFF REPAIR;  Surgeon: Krasinski, Kevin, MD;  Location: [facility];  Service: Orthopedics;  Laterality: Right;   TONSILLECTOMY     Patient Active Problem List   Diagnosis Date Noted   Medicare annual wellness visit, subsequent 11/29/2022    Tremor 11/29/2022   GERD (gastroesophageal reflux disease) 03/15/2022   Irregular heart rhythm 11/05/2021   White coat syndrome with high blood pressure without hypertension 11/05/2021   Paresthesia 11/10/2020   Erectile dysfunction 11/10/2020   PSA elevation 11/10/2020   Osteoarthritis of right knee 06/21/2020   Synovitis of knee 06/21/2020   Partial thickness rotator cuff tear 06/21/2020   Knee pain 11/12/2019   Actinic keratosis 11/12/2019   Scoliosis 01/09/2019   Advance care planning 12/28/2018   Healthcare maintenance 12/28/2018   Venous insufficiency    Thrombocytopenia (HCC)    Prostatitis    Paroxysmal atrial fibrillation (HCC)    Hypothyroidism    History of oropharyngeal cancer 10/03/2018   Essential hypertension 08/12/2015   Palpitations 08/12/2015    ONSET DATE: 02/12/2023 (referral date)    REFERRING DIAG: Parkinsonism  THERAPY DIAG:  Primary parkinsonism (HCC)  Hypophonia  Dysarthria and anarthria  Rationale for Evaluation and Treatment Rehabilitation  SUBJECTIVE:   SUBJECTIVE STATEMENT: Pt alert, pleasant, and cooperative Pt accompanied by: self  PERTINENT HISTORY: as above  DIAGNOSTIC FINDINGS:  From ENT note, 1. 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 He is 5 years out from treatment and is considered cured from cancer. I will follow  up with him in 1 year for routine surveillance. 2. Dysphagia - Stable. 3. Thyroid  function-This is being followed by his PCP, but we offered to check this if it has not been checked recently. 4. Imaging -his PCP is followed up with a chest x-ray that was normal by his report 5. Carotid arteriosclerosis -he had a Doppler with his PCP that was normal. 6. Xerostomia-the patient has medical necessity for dry mouth lozenges and Biotene that he takes on a regular basis. 7. Hoarseness - the patient's voice comes and goes but he does a  lot of throat clearing. I did recommend that he try to minimize the throat clearing so that his voice is more stable.  PAIN:  Are you having pain? No  FALLS: Has patient fallen in last 6 months?  No  LIVING ENVIRONMENT: Lives with: lives with their spouse Lives in: House/apartment  PLOF:  Level of assistance: Independent with ADLs Employment: Retired  PATIENT GOALS    to improve communication in a variety of settings  OBJECTIVE:  TODAY'S TREATMENT:  Reviewed EMST for improve expiratory strength and endurance. Training threshold 67.5 cmH2O. Pt completed 5 sets of x5 breaths with min verbal/visual cues. Additional education provided re: benefits and rationale of EMST. Pt to complete via HEP.   Reviewed possible changes to voice following radiation tx to head/neck. Reviewed/introduced myofascial release of larynx, neck, shoulder, base of tongue, and jaw. Handout provided for HEP.  Reviewed conversation training therapy. Pt able to ID hoarse voicing vs clear voicing (utilizing his [place] voice). Pt with tendency to speak at a lower volume which results in additional hoarseness. Biofeedback was provided with use of Voce Analyst app with adequate vocal intensity (~75 dB) and improved vocal quality during ~10 minutes of conversation. Slight reduction in volume resulted in increased hoarseness, but pt able to amend independently during structured speech.  Today, pt with subjective report that his voice was getting better and stronger. Pt also commented that his wife appreciates the changes to his voice.    PATIENT EDUCATION: Education details: as above Person educated: Patient Education method: Explanation Education comprehension: verbalized understanding and needs further education    GOALS: Goals reviewed with patient? Yes  SHORT TERM GOALS: Target date: 10 sessions  The patient will demonstrate abdominal breathing patterns and steady release of breath on exhalation to  optimize efficiency of voicing and decrease laryngeal hyperfunction with rate cues.   Baseline: Goal status: INITIAL  2.  The patient will complete HEP (Maximum duration ah, High/Lows, and Functional Phrases) at average loudness >/= 80 dB and with loud, good quality voice with min cues  Baseline:  Goal status: INITIAL  3.  The patient will complete Hierarchal Speech Loudness reading drills (words/phrases, sentences) at average >/= 75 dB and with loud, good quality voice with min cues.  Baseline:  Goal status: INITIAL  4.  The patient will participate in 5-8 minutes conversation, maintaining average loudness of 75 dB and good quality voice with modified independence.  Baseline:  Goal status: INITIAL  5. The patient will demonstrate understanding of vocal hygiene concepts with min A. Baseline:  Goal status: INITIAL      LONG TERM GOALS: Target date: 12 weeks, 06/01/23  The patient will demonstrate abdominal breathing patterns and steady release of breath on exhalation to optimize efficiency of voicing and decrease laryngeal hyperfunction with rare cues.   Baseline: Goal status: INITIAL  The patient will complete HEP (Maximum duration ah, High/Lows, and Functional Phrases) at  average loudness >/= 80 dB and with loud, good quality voice indep. Baseline:  Goal status: INITIAL    The patient will participate in 15-20 minutes conversation, maintaining average loudness of 75 dB and good quality voice with modified independence.  Baseline:  Goal status: INITIAL  4. Pt will report improvement in communication per PROM. Baseline: CES 19/32 Goal status: INITIAL  ASSESSMENT:  CLINICAL IMPRESSION: Patient is a 83 y.o. male who was seen today for voice/motor-speech treatment in setting of Parkinsonism. 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 Pt presents with s/sx hypokinetic dysarthria c/b  hypophonia, hoarse/strained, occasionally breathy voice. Pt with tendency to speak on residual capacity and utilize thoracic and clavicular breathing patterns. Pt with reports of dissatisfaction with ability to verbally communicate in social settings or at a distance from a communication partner. See details of tx session above. Recommend course of ST with an intensity based approach as well as additional education re: vocal changes in Parkinsonism and vocal hygiene.  OBJECTIVE IMPAIRMENTS include voice disorder. These impairments are limiting patient from effectively communicating at home and in community. Factors affecting potential to achieve goals and functional outcome are co-morbidities. Patient will benefit from skilled SLP services to address above impairments and improve overall function.  REHAB POTENTIAL: Good  PLAN: SLP FREQUENCY: 1-2x/week  SLP DURATION: 12 weeks  PLANNED INTERVENTIONS: Environmental controls, Cueing hierachy, Internal/external aids, Functional tasks, Multimodal communication approach, SLP instruction and feedback, Compensatory strategies, and Patient/family education    Delon Bangs, M.S., CCC-SLP Speech-Language Pathologist [place] [facility] [phone] [place])  [facility] at [facility] [address] [place], [place], [address] Phone: [phone]   Fax:  [phone]

## 2023-04-08 ENCOUNTER — Ambulatory Visit: Payer: Medicare Other

## 2023-04-08 DIAGNOSIS — G20C Parkinsonism, unspecified: Secondary | ICD-10-CM | POA: Diagnosis not present

## 2023-04-08 DIAGNOSIS — R498 Other voice and resonance disorders: Secondary | ICD-10-CM

## 2023-04-08 DIAGNOSIS — R471 Dysarthria and anarthria: Secondary | ICD-10-CM

## 2023-04-08 NOTE — Therapy (Signed)
 OUTPATIENT SPEECH LANGUAGE PATHOLOGY [facility]   Patient Name: Norman Mitchell MRN: [id number] DOB:Jul 17, 1940, 83 y.o., male Today's Date: 04/08/2023  PCP: Arlyss Solian, MD  REFERRING PROVIDER: Asberry Schneider, DO  Speech Therapy Progress Note  Dates of Reporting Period: 03/09/2023 to 04/08/23  Objective: Patient has been seen for 10 speech therapy sessions this reporting period targeting dysphonia and dysarthria in setting of Parkinsonism and hx of head and neck cancer. Patient is making progress toward LTGs and met 3/5 STGs this reporting period. See skilled intervention, clinical impressions, and goals below for details.    End of Session - 04/08/23 1350     Visit Number 10    Number of Visits 24    Date for SLP Re-Evaluation 06/01/23    SLP Start Time 1355    SLP Stop Time  1445    SLP Time Calculation (min) 50 min    Activity Tolerance Patient tolerated treatment well             Past Medical History:  Diagnosis Date   Arthritis    Hands, knee   Back pain    Complication of anesthesia    Was told after 1 surgery that his HR went really low.  Says his normal HR is 50-55.   DVT (deep venous thrombosis) (HCC) 2017   Bilateral knees.  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   Hypothyroidism    Oral cancer (HCC)    Status post radiation.   Paroxysmal atrial fibrillation (HCC)    Prostatitis    Scoliosis    Thrombocytopenia (HCC)    Venous insufficiency    legs   Past Surgical History:  Procedure Laterality Date   CATARACT EXTRACTION Bilateral    KNEE ARTHROSCOPY     KNEE ARTHROSCOPY WITH MEDIAL MENISECTOMY Right 01/09/2020   Procedure: RIGHT KNEE ARTHROSCOPY WITH MEDIAL MENISECTOMY;  Surgeon: Marchia Drivers, MD;  Location: [facility];  Service: Orthopedics;  Laterality: Right;   Oral biopsy     PILONIDAL CYST EXCISION     PROSTATE BIOPSY     Negative   SEPTOPLASTY     SHOULDER ARTHROSCOPY WITH ROTATOR CUFF REPAIR AND SUBACROMIAL DECOMPRESSION Right  07/02/2020   Procedure: RIGHT SHOULDER ARTHROSCOPY  DISTAL CLAVICLE EXCISION, BICEPS TENODESIS, SUBACROMIAL DECOMPRESSION, ANTERIOR LABRAL REPAIR. AND MINI OPEN ROTATOR CUFF REPAIR;  Surgeon: Krasinski, Kevin, MD;  Location: [facility];  Service: Orthopedics;  Laterality: Right;   TONSILLECTOMY     Patient Active Problem List   Diagnosis Date Noted   Medicare annual wellness visit, subsequent 11/29/2022   Tremor 11/29/2022   GERD (gastroesophageal reflux disease) 03/15/2022   Irregular heart rhythm 11/05/2021   White coat syndrome with high blood pressure without hypertension 11/05/2021   Paresthesia 11/10/2020   Erectile dysfunction 11/10/2020   PSA elevation 11/10/2020   Osteoarthritis of right knee 06/21/2020   Synovitis of knee 06/21/2020   Partial thickness rotator cuff tear 06/21/2020   Knee pain 11/12/2019   Actinic keratosis 11/12/2019   Scoliosis 01/09/2019   Advance care planning 12/28/2018   Healthcare maintenance 12/28/2018   Venous insufficiency    Thrombocytopenia (HCC)    Prostatitis    Paroxysmal atrial fibrillation (HCC)    Hypothyroidism    History of oropharyngeal cancer 10/03/2018   Essential hypertension 08/12/2015   Palpitations 08/12/2015    ONSET DATE: 02/12/2023 (referral date)    REFERRING DIAG: Parkinsonism  THERAPY DIAG:  Primary parkinsonism (HCC)  Hypophonia  Dysarthria and anarthria  Rationale for Evaluation and  Treatment Rehabilitation  SUBJECTIVE:   SUBJECTIVE STATEMENT: Pt alert, pleasant, and cooperative Pt accompanied by: self  PERTINENT HISTORY: as above  DIAGNOSTIC FINDINGS:  From ENT note, 1. 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 He is 5 years out from treatment and is considered cured from cancer. I will follow up with him in 1 year for routine surveillance. 2. Dysphagia - Stable. 3. Thyroid  function-This is being followed  by his PCP, but we offered to check this if it has not been checked recently. 4. Imaging -his PCP is followed up with a chest x-ray that was normal by his report 5. Carotid arteriosclerosis -he had a Doppler with his PCP that was normal. 6. Xerostomia-the patient has medical necessity for dry mouth lozenges and Biotene that he takes on a regular basis. 7. Hoarseness - the patient's voice comes and goes but he does a lot of throat clearing. I did recommend that he try to minimize the throat clearing so that his voice is more stable.  PAIN:  Are you having pain? No  FALLS: Has patient fallen in last 6 months?  No  LIVING ENVIRONMENT: Lives with: lives with their spouse Lives in: House/apartment  PLOF:  Level of assistance: Independent with ADLs Employment: Retired  PATIENT GOALS    to improve communication in a variety of settings  OBJECTIVE:  TODAY'S TREATMENT:  Reviewed EMST for improve expiratory strength and endurance. Attempted increasing training threshold. Pt unable to complete x5 breaths. Pt completed 5 sets of x5 breaths with min verbal cues for rest breaks. Additional education provided re: benefits and rationale of EMST. Pt to complete via HEP.   Reviewed possible changes to voice following radiation tx to head/neck. Reviewed myofascial release of larynx, neck, shoulder, base of tongue, and jaw. Pt endorsed completing via HEP.  Reviewed conversation training therapy. Pt able to ID hoarse voicing vs clear voicing (utilizing his [place] voice). Pt with tendency to speak at a lower volume which results in additional hoarseness. Biofeedback was provided with use of Voce Analyst app with adequate vocal intensity (~75 dB) and improved vocal quality during ~12 minutes of conversation. Slight reduction in volume resulted in increased hoarseness, but pt able to amend independently during structured speech.  Today, pt with continued subjective reports of improved voicing.      PATIENT EDUCATION: Education details: as above Person educated: Patient Education method: Explanation Education comprehension: verbalized understanding and needs further education    GOALS: Goals reviewed with patient? Yes  SHORT TERM GOALS: Target date: 10 sessions  The patient will demonstrate abdominal breathing patterns and steady release of breath on exhalation to optimize efficiency of voicing and decrease laryngeal hyperfunction with rate cues.   Baseline: Goal status: MET  2.  The patient will complete HEP (Maximum duration ah, High/Lows, and Functional Phrases) at average loudness >/= 80 dB and with loud, good quality voice with min cues  Baseline:  Goal status: MET  3.  The patient will complete Hierarchal Speech Loudness reading drills (words/phrases, sentences) at average >/= 75 dB and with loud, good quality voice with min cues.  Baseline:  Goal status: MET  4.  The patient will participate in 5-8 minutes conversation, maintaining average loudness of 75 dB and good quality voice with modified independence.  Baseline:  Goal status: IN PROGRESS  5. The patient will demonstrate understanding of vocal hygiene concepts with min A. Baseline:  Goal  status: IN PROGRESS     LONG TERM GOALS: Target date: 12 weeks, 06/01/23  The patient will demonstrate abdominal breathing patterns and steady release of breath on exhalation to optimize efficiency of voicing and decrease laryngeal hyperfunction with rare cues.   Baseline: Goal status: IN PROGRESS  The patient will complete HEP (Maximum duration ah, High/Lows, and Functional Phrases) at average loudness >/= 80 dB and with loud, good quality voice indep. Baseline:  Goal status: IN PROGRESS    The patient will participate in 15-20 minutes conversation, maintaining average loudness of 75 dB and good quality voice with modified independence.  Baseline:  Goal status: IN PROGRESS  4. Pt will report improvement  in communication per PROM. Baseline: CES 19/32 Goal status: IN PROGRESS  ASSESSMENT:  CLINICAL IMPRESSION: Patient is a 83 y.o. male who was seen today for voice/motor-speech treatment in setting of Parkinsonism. 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 Pt presents with s/sx hypokinetic dysarthria c/b hypophonia, hoarse/strained, occasionally breathy voice. Pt with tendency to speak on residual capacity and utilize thoracic and clavicular breathing patterns. Pt with reports of dissatisfaction with ability to verbally communicate in social settings or at a distance from a communication partner. See details of tx session above. Recommend course of ST with an intensity based approach as well as additional education re: vocal changes in Parkinsonism and vocal hygiene.  OBJECTIVE IMPAIRMENTS include voice disorder. These impairments are limiting patient from effectively communicating at home and in community. Factors affecting potential to achieve goals and functional outcome are co-morbidities. Patient will benefit from skilled SLP services to address above impairments and improve overall function.  REHAB POTENTIAL: Good  PLAN: SLP FREQUENCY: 1-2x/week  SLP DURATION: 12 weeks  PLANNED INTERVENTIONS: Environmental controls, Cueing hierachy, Internal/external aids, Functional tasks, Multimodal communication approach, SLP instruction and feedback, Compensatory strategies, and Patient/family education    Delon Bangs, M.S., CCC-SLP Speech-Language Pathologist [employer] [facility] [phone] [place])  [facility] Out[facility] Rehabilitation at [facility] [address], [place], [address] Phone: [phone]   Fax:  [phone]

## 2023-04-13 ENCOUNTER — Ambulatory Visit: Payer: Medicare Other

## 2023-04-13 DIAGNOSIS — R471 Dysarthria and anarthria: Secondary | ICD-10-CM

## 2023-04-13 DIAGNOSIS — G20C Parkinsonism, unspecified: Secondary | ICD-10-CM

## 2023-04-13 DIAGNOSIS — R498 Other voice and resonance disorders: Secondary | ICD-10-CM

## 2023-04-13 NOTE — Therapy (Signed)
OUTPATIENT SPEECH LANGUAGE PATHOLOGY [facility]   Patient Name: Norman Mitchell MRN: [id number] DOB:1940-11-22, 83 y.o., male Today's Date: 04/13/2023  PCP: Crawford Givens, MD  REFERRING PROVIDER: Kerin Salen, DO   End of Session - 04/13/23 1541     Visit Number 11    Number of Visits 24    Date for SLP Re-Evaluation 06/01/23    SLP Start Time 1400    SLP Stop Time  1445    SLP Time Calculation (min) 45 min    Activity Tolerance Patient tolerated treatment well             Past Medical History:  Diagnosis Date   Arthritis    Hands, knee   Back pain    Complication of anesthesia    Was told after 1 surgery that his HR went "really low".  Says his normal HR is 50-55.   DVT (deep venous thrombosis) (HCC) 2017   Bilateral knees.  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   Hypothyroidism    Oral cancer (HCC)    Status post radiation.   Paroxysmal atrial fibrillation (HCC)    Prostatitis    Scoliosis    Thrombocytopenia (HCC)    Venous insufficiency    legs   Past Surgical History:  Procedure Laterality Date   CATARACT EXTRACTION Bilateral    KNEE ARTHROSCOPY     KNEE ARTHROSCOPY WITH MEDIAL MENISECTOMY Right 01/09/2020   Procedure: RIGHT KNEE ARTHROSCOPY WITH MEDIAL MENISECTOMY;  Surgeon: Juanell Fairly, MD;  Location: [facility];  Service: Orthopedics;  Laterality: Right;   Oral biopsy     PILONIDAL CYST EXCISION     PROSTATE BIOPSY     Negative   SEPTOPLASTY     SHOULDER ARTHROSCOPY WITH ROTATOR CUFF REPAIR AND SUBACROMIAL DECOMPRESSION Right 07/02/2020   Procedure: RIGHT SHOULDER ARTHROSCOPY  DISTAL CLAVICLE EXCISION, BICEPS TENODESIS, SUBACROMIAL DECOMPRESSION, ANTERIOR LABRAL REPAIR. AND MINI OPEN ROTATOR CUFF REPAIR;  Surgeon: Juanell Fairly, MD;  Location: [facility];  Service: Orthopedics;  Laterality: Right;   TONSILLECTOMY     Patient Active Problem List   Diagnosis Date Noted   Medicare annual wellness visit, subsequent 11/29/2022    Tremor 11/29/2022   GERD (gastroesophageal reflux disease) 03/15/2022   Irregular heart rhythm 11/05/2021   White coat syndrome with high blood pressure without hypertension 11/05/2021   Paresthesia 11/10/2020   Erectile dysfunction 11/10/2020   PSA elevation 11/10/2020   Osteoarthritis of right knee 06/21/2020   Synovitis of knee 06/21/2020   Partial thickness rotator cuff tear 06/21/2020   Knee pain 11/12/2019   Actinic keratosis 11/12/2019   Scoliosis 01/09/2019   Advance care planning 12/28/2018   Healthcare maintenance 12/28/2018   Venous insufficiency    Thrombocytopenia (HCC)    Prostatitis    Paroxysmal atrial fibrillation (HCC)    Hypothyroidism    History of oropharyngeal cancer 10/03/2018   Essential hypertension 08/12/2015   Palpitations 08/12/2015    ONSET DATE: 02/12/2023 (referral date)    REFERRING DIAG: Parkinsonism  THERAPY DIAG:  Primary parkinsonism (HCC)  Hypophonia  Dysarthria and anarthria  Rationale for Evaluation and Treatment Rehabilitation  SUBJECTIVE:   SUBJECTIVE STATEMENT: Pt alert, pleasant, and cooperative Pt accompanied by: self  PERTINENT HISTORY: as above  DIAGNOSTIC FINDINGS:  From ENT note, "1. 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 He is 5 years out from treatment and is considered cured from cancer. I will follow  up with him in 1 year for routine surveillance. 2. Dysphagia - Stable. 3. Thyroid function-This is being followed by his PCP, but we offered to check this if it has not been checked recently. 4. Imaging -his PCP is followed up with a chest x-ray that was normal by his report 5. Carotid arteriosclerosis -he had a Doppler with his PCP that was normal. 6. Xerostomia-the patient has medical necessity for dry mouth lozenges and Biotene that he takes on a regular basis. 7. Hoarseness - the patient's voice comes and goes but he does a  lot of throat clearing. I did recommend that he try to minimize the throat clearing so that his voice is more stable."  PAIN:  Are you having pain? No  FALLS: Has patient fallen in last 6 months?  No  LIVING ENVIRONMENT: Lives with: lives with their spouse Lives in: House/apartment  PLOF:  Level of assistance: Independent with ADLs Employment: Retired  PATIENT GOALS    to improve communication in a variety of settings  OBJECTIVE:  TODAY'S TREATMENT:  Reviewed EMST for improve expiratory strength and endurance. Pt completed 5 sets of x5 breaths with min verbal cues for rest breaks. Additional education provided re: benefits and rationale of EMST. Pt to complete via HEP.    Reviewed myofascial release of larynx, neck, shoulder, base of tongue, and jaw. Pt endorsed completing via HEP, but not today. Pt indep completed myofascial release.  Reviewed conversation training therapy. Pt able to ID hoarse voicing vs clear voicing (utilizing his "[name] voice)." Pt with tendency to speak at a lower volume which results in additional hoarseness. Biofeedback was provided with use of Voce Analyst app with adequate vocal intensity (~75 dB) and improved vocal quality during ~5 minutes. Volume decay and increased hoarseness during last 5 minutes of speech sample.  Today, pt with noted that his voice was "back to normal" on Friday, but hoarse today. At the end of the session, pt endorsed marked improvement in vocal quality. SLP agreed noting reduced hoarseness while pt exiting SLP session.    PATIENT EDUCATION: Education details: as above Person educated: Patient Education method: Explanation Education comprehension: verbalized understanding and needs further education    GOALS: Goals reviewed with patient? Yes  SHORT TERM GOALS: Target date: 10 sessions  The patient will demonstrate abdominal breathing patterns and steady release of breath on exhalation to optimize efficiency of voicing  and decrease laryngeal hyperfunction with rate cues.   Baseline: Goal status: MET  2.  The patient will complete HEP (Maximum duration "ah", High/Lows, and Functional Phrases) at average loudness >/= 80 dB and with loud, good quality voice with min cues  Baseline:  Goal status: MET  3.  The patient will complete Hierarchal Speech Loudness reading drills (words/phrases, sentences) at average >/= 75 dB and with loud, good quality voice with min cues.  Baseline:  Goal status: MET  4.  The patient will participate in 5-8 minutes conversation, maintaining average loudness of 75 dB and good quality voice with modified independence.  Baseline:  Goal status: IN PROGRESS  5. The patient will demonstrate understanding of vocal hygiene concepts with min A. Baseline:  Goal status: IN PROGRESS     LONG TERM GOALS: Target date: 12 weeks, 06/01/23  The patient will demonstrate abdominal breathing patterns and steady release of breath on exhalation to optimize efficiency of voicing and decrease laryngeal hyperfunction with rare cues.   Baseline: Goal status: IN PROGRESS  The patient will complete HEP (  Maximum duration "ah", High/Lows, and Functional Phrases) at average loudness >/= 80 dB and with loud, good quality voice indep. Baseline:  Goal status: IN PROGRESS    The patient will participate in 15-20 minutes conversation, maintaining average loudness of 75 dB and good quality voice with modified independence.  Baseline:  Goal status: IN PROGRESS  4. Pt will report improvement in communication per PROM. Baseline: CES 19/32 Goal status: IN PROGRESS  ASSESSMENT:  CLINICAL IMPRESSION: Patient is a 83 y.o. male who was seen today for voice/motor-speech treatment in setting of Parkinsonism. 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 Pt presents with s/sx hypokinetic dysarthria c/b hypophonia,  hoarse/strained, occasionally breathy voice. Pt with tendency to speak on residual capacity and utilize thoracic and clavicular breathing patterns. Pt with reports of dissatisfaction with ability to verbally communicate in social settings or at a distance from a communication partner. See details of tx session above. Recommend course of ST with an intensity based approach as well as additional education re: vocal changes in Parkinsonism and vocal hygiene.  OBJECTIVE IMPAIRMENTS include voice disorder. These impairments are limiting patient from effectively communicating at home and in community. Factors affecting potential to achieve goals and functional outcome are co-morbidities. Patient will benefit from skilled SLP services to address above impairments and improve overall function.  REHAB POTENTIAL: Good  PLAN: SLP FREQUENCY: 1-2x/week  SLP DURATION: 12 weeks  PLANNED INTERVENTIONS: Environmental controls, Cueing hierachy, Internal/external aids, Functional tasks, Multimodal communication approach, SLP instruction and feedback, Compensatory strategies, and Patient/family education    Clyde Canterbury, M.S., CCC-SLP Speech-Language Pathologist [place] [facility] [phone] Arnette Felts)  [place] [facility] Outpatient Rehabilitation at [facility] [address] [place], [place], [address] Phone: [phone]   Fax:  [phone]

## 2023-04-15 ENCOUNTER — Ambulatory Visit: Payer: Medicare Other

## 2023-04-15 DIAGNOSIS — G20C Parkinsonism, unspecified: Secondary | ICD-10-CM

## 2023-04-15 DIAGNOSIS — R498 Other voice and resonance disorders: Secondary | ICD-10-CM

## 2023-04-15 DIAGNOSIS — R471 Dysarthria and anarthria: Secondary | ICD-10-CM

## 2023-04-15 NOTE — Therapy (Addendum)
OUTPATIENT SPEECH LANGUAGE PATHOLOGY [facility]   Patient Name: Norman Mitchell MRN: [id number] DOB:1941/01/19, 83 y.o., male Today's Date: 04/20/2023  PCP: Crawford Givens, MD  REFERRING PROVIDER: Kerin Salen, DO   End of Session - 04/20/23 1256     Visit Number 12    Number of Visits 24    Date for SLP Re-Evaluation 06/01/23    SLP Start Time 1400    SLP Stop Time  1445    SLP Time Calculation (min) 45 min    Activity Tolerance Patient tolerated treatment well             Past Medical History:  Diagnosis Date   Arthritis    Hands, knee   Back pain    Complication of anesthesia    Was told after 1 surgery that his HR went "really low".  Says his normal HR is 50-55.   DVT (deep venous thrombosis) (HCC) 2017   Bilateral knees.  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   Hypothyroidism    Oral cancer (HCC)    Status post radiation.   Paroxysmal atrial fibrillation (HCC)    Prostatitis    Scoliosis    Thrombocytopenia (HCC)    Venous insufficiency    legs   Past Surgical History:  Procedure Laterality Date   CATARACT EXTRACTION Bilateral    KNEE ARTHROSCOPY     KNEE ARTHROSCOPY WITH MEDIAL MENISECTOMY Right 01/09/2020   Procedure: RIGHT KNEE ARTHROSCOPY WITH MEDIAL MENISECTOMY;  Surgeon: Juanell Fairly, MD;  Location: [facility];  Service: Orthopedics;  Laterality: Right;   Oral biopsy     PILONIDAL CYST EXCISION     PROSTATE BIOPSY     Negative   SEPTOPLASTY     SHOULDER ARTHROSCOPY WITH ROTATOR CUFF REPAIR AND SUBACROMIAL DECOMPRESSION Right 07/02/2020   Procedure: RIGHT SHOULDER ARTHROSCOPY  DISTAL CLAVICLE EXCISION, BICEPS TENODESIS, SUBACROMIAL DECOMPRESSION, ANTERIOR LABRAL REPAIR. AND MINI OPEN ROTATOR CUFF REPAIR;  Surgeon: Juanell Fairly, MD;  Location: [facility];  Service: Orthopedics;  Laterality: Right;   TONSILLECTOMY     Patient Active Problem List   Diagnosis Date Noted   Medicare annual wellness visit, subsequent 11/29/2022    Tremor 11/29/2022   GERD (gastroesophageal reflux disease) 03/15/2022   Irregular heart rhythm 11/05/2021   White coat syndrome with high blood pressure without hypertension 11/05/2021   Paresthesia 11/10/2020   Erectile dysfunction 11/10/2020   PSA elevation 11/10/2020   Osteoarthritis of right knee 06/21/2020   Synovitis of knee 06/21/2020   Partial thickness rotator cuff tear 06/21/2020   Knee pain 11/12/2019   Actinic keratosis 11/12/2019   Scoliosis 01/09/2019   Advance care planning 12/28/2018   Healthcare maintenance 12/28/2018   Venous insufficiency    Thrombocytopenia (HCC)    Prostatitis    Paroxysmal atrial fibrillation (HCC)    Hypothyroidism    History of oropharyngeal cancer 10/03/2018   Essential hypertension 08/12/2015   Palpitations 08/12/2015    ONSET DATE: 02/12/2023 (referral date)    REFERRING DIAG: Parkinsonism  THERAPY DIAG:  Primary parkinsonism (HCC)  Hypophonia  Dysarthria and anarthria  Rationale for Evaluation and Treatment Rehabilitation  SUBJECTIVE:   SUBJECTIVE STATEMENT: Pt alert, pleasant, and cooperative Pt accompanied by: self  PERTINENT HISTORY: as above  DIAGNOSTIC FINDINGS:  From ENT note, "1. 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 He is 5 years out from treatment and is considered cured from cancer. I will follow  up with him in 1 year for routine surveillance. 2. Dysphagia - Stable. 3. Thyroid function-This is being followed by his PCP, but we offered to check this if it has not been checked recently. 4. Imaging -his PCP is followed up with a chest x-ray that was normal by his report 5. Carotid arteriosclerosis -he had a Doppler with his PCP that was normal. 6. Xerostomia-the patient has medical necessity for dry mouth lozenges and Biotene that he takes on a regular basis. 7. Hoarseness - the patient's voice comes and goes but he does a  lot of throat clearing. I did recommend that he try to minimize the throat clearing so that his voice is more stable."  PAIN:  Are you having pain? No  FALLS: Has patient fallen in last 6 months?  No  LIVING ENVIRONMENT: Lives with: lives with their spouse Lives in: House/apartment  PLOF:  Level of assistance: Independent with ADLs Employment: Retired  PATIENT GOALS    to improve communication in a variety of settings  OBJECTIVE:  TODAY'S TREATMENT:  Reviewed EMST for improve expiratory strength and endurance. Pt completed 5 sets of x5 breaths with min verbal cues for rest breaks. Additional education provided re: benefits and rationale of EMST. Pt to complete via HEP.   Reviewed myofascial release of larynx, neck, shoulder, base of tongue, and jaw. Pt endorsed completing via HEP, but not today. Pt indep completed circumlaryngeal massage.  Reviewed conversation training therapy. Pt able to ID hoarse voicing vs clear voicing (utilizing his "[name] voice)." Pt with tendency to speak at a lower volume which results in additional hoarseness. Biofeedback was provided with use of Voce Analyst app with adequate vocal intensity (~75 dB).  Mild-moderate hoarseness persists which improves with loudness and forward resonance.    PATIENT EDUCATION: Education details: as above Person educated: Patient Education method: Explanation Education comprehension: verbalized understanding and needs further education    GOALS: Goals reviewed with patient? Yes  SHORT TERM GOALS: Target date: 10 sessions  The patient will demonstrate abdominal breathing patterns and steady release of breath on exhalation to optimize efficiency of voicing and decrease laryngeal hyperfunction with rate cues.   Baseline: Goal status: MET  2.  The patient will complete HEP (Maximum duration "ah", High/Lows, and Functional Phrases) at average loudness >/= 80 dB and with loud, good quality voice with min cues   Baseline:  Goal status: MET  3.  The patient will complete Hierarchal Speech Loudness reading drills (words/phrases, sentences) at average >/= 75 dB and with loud, good quality voice with min cues.  Baseline:  Goal status: MET  4.  The patient will participate in 5-8 minutes conversation, maintaining average loudness of 75 dB and good quality voice with modified independence.  Baseline:  Goal status: IN PROGRESS  5. The patient will demonstrate understanding of vocal hygiene concepts with min A. Baseline:  Goal status: IN PROGRESS     LONG TERM GOALS: Target date: 12 weeks, 06/01/23  The patient will demonstrate abdominal breathing patterns and steady release of breath on exhalation to optimize efficiency of voicing and decrease laryngeal hyperfunction with rare cues.   Baseline: Goal status: IN PROGRESS  The patient will complete HEP (Maximum duration "ah", High/Lows, and Functional Phrases) at average loudness >/= 80 dB and with loud, good quality voice indep. Baseline:  Goal status: IN PROGRESS    The patient will participate in 15-20 minutes conversation, maintaining average loudness of 75 dB and good quality voice with modified  independence.  Baseline:  Goal status: IN PROGRESS  4. Pt will report improvement in communication per PROM. Baseline: CES 19/32 Goal status: IN PROGRESS  ASSESSMENT:  CLINICAL IMPRESSION: Patient is a 83 y.o. male who was seen today for voice/motor-speech treatment in setting of Parkinsonism. 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 Pt presents with s/sx hypokinetic dysarthria c/b hypophonia, hoarse/strained, occasionally breathy voice. Pt with tendency to speak on residual capacity and utilize thoracic and clavicular breathing patterns. Pt with reports of dissatisfaction with ability to verbally communicate in social settings or at a distance from a  communication partner. See details of tx session above. Recommend course of ST with an intensity based approach as well as additional education re: vocal changes in Parkinsonism and vocal hygiene.  OBJECTIVE IMPAIRMENTS include voice disorder. These impairments are limiting patient from effectively communicating at home and in community. Factors affecting potential to achieve goals and functional outcome are co-morbidities. Patient will benefit from skilled SLP services to address above impairments and improve overall function.  REHAB POTENTIAL: Good  PLAN: SLP FREQUENCY: 1-2x/week  SLP DURATION: 12 weeks  PLANNED INTERVENTIONS: Environmental controls, Cueing hierachy, Internal/external aids, Functional tasks, Multimodal communication approach, SLP instruction and feedback, Compensatory strategies, and Patient/family education    Clyde Canterbury, M.S., CCC-SLP Speech-Language Pathologist [facility] [phone] Arnette Felts)  [facility] at [facility] [address] [place], [place], [address] Phone: [phone]   Fax:  [phone]

## 2023-04-20 ENCOUNTER — Ambulatory Visit: Payer: Medicare Other

## 2023-04-20 DIAGNOSIS — R471 Dysarthria and anarthria: Secondary | ICD-10-CM

## 2023-04-20 DIAGNOSIS — G20C Parkinsonism, unspecified: Secondary | ICD-10-CM

## 2023-04-20 DIAGNOSIS — R498 Other voice and resonance disorders: Secondary | ICD-10-CM

## 2023-04-20 NOTE — Therapy (Signed)
OUTPATIENT SPEECH LANGUAGE PATHOLOGY [facility]   Patient Name: Norman Mitchell MRN: [id number] DOB:[birth year], 83 y.o., male Today's Date: 04/20/2023  PCP: Crawford Givens, MD  REFERRING PROVIDER: Kerin Salen, DO   End of Session - 04/20/23 1255     Visit Number 13    Number of Visits 24    Date for SLP Re-Evaluation 06/01/23    SLP Start Time 1300    SLP Stop Time  1345    SLP Time Calculation (min) 45 min    Activity Tolerance Patient tolerated treatment well             Past Medical History:  Diagnosis Date   Arthritis    Hands, knee   Back pain    Complication of anesthesia    Was told after 1 surgery that his HR went "really low".  Says his normal HR is 50-55.   DVT (deep venous thrombosis) (HCC) 2017   Bilateral knees.  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   Hypothyroidism    Oral cancer (HCC)    Status post radiation.   Paroxysmal atrial fibrillation (HCC)    Prostatitis    Scoliosis    Thrombocytopenia (HCC)    Venous insufficiency    legs   Past Surgical History:  Procedure Laterality Date   CATARACT EXTRACTION Bilateral    KNEE ARTHROSCOPY     KNEE ARTHROSCOPY WITH MEDIAL MENISECTOMY Right 01/09/2020   Procedure: RIGHT KNEE ARTHROSCOPY WITH MEDIAL MENISECTOMY;  Surgeon: Juanell Fairly, MD;  Location: [facility];  Service: Orthopedics;  Laterality: Right;   Oral biopsy     PILONIDAL CYST EXCISION     PROSTATE BIOPSY     Negative   SEPTOPLASTY     SHOULDER ARTHROSCOPY WITH ROTATOR CUFF REPAIR AND SUBACROMIAL DECOMPRESSION Right 07/02/2020   Procedure: RIGHT SHOULDER ARTHROSCOPY  DISTAL CLAVICLE EXCISION, BICEPS TENODESIS, SUBACROMIAL DECOMPRESSION, ANTERIOR LABRAL REPAIR. AND MINI OPEN ROTATOR CUFF REPAIR;  Surgeon: Juanell Fairly, MD;  Location: [facility];  Service: Orthopedics;  Laterality: Right;   TONSILLECTOMY     Patient Active Problem List   Diagnosis Date Noted   Medicare annual wellness visit, subsequent 11/29/2022    Tremor 11/29/2022   GERD (gastroesophageal reflux disease) 03/15/2022   Irregular heart rhythm 11/05/2021   White coat syndrome with high blood pressure without hypertension 11/05/2021   Paresthesia 11/10/2020   Erectile dysfunction 11/10/2020   PSA elevation 11/10/2020   Osteoarthritis of right knee 06/21/2020   Synovitis of knee 06/21/2020   Partial thickness rotator cuff tear 06/21/2020   Knee pain 11/12/2019   Actinic keratosis 11/12/2019   Scoliosis 01/09/2019   Advance care planning 12/28/2018   Healthcare maintenance 12/28/2018   Venous insufficiency    Thrombocytopenia (HCC)    Prostatitis    Paroxysmal atrial fibrillation (HCC)    Hypothyroidism    History of oropharyngeal cancer 10/03/2018   Essential hypertension 08/12/2015   Palpitations 08/12/2015    ONSET DATE: 02/12/2023 (referral date)    REFERRING DIAG: Parkinsonism  THERAPY DIAG:  Primary parkinsonism (HCC)  Hypophonia  Dysarthria and anarthria  Rationale for Evaluation and Treatment Rehabilitation  SUBJECTIVE:   SUBJECTIVE STATEMENT: Pt alert, pleasant, and cooperative Pt accompanied by: self  PERTINENT HISTORY: as above  DIAGNOSTIC FINDINGS:  From ENT note, "1. 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 He is 5 years out from treatment and is considered cured from cancer. I will follow  up with him in 1 year for routine surveillance. 2. Dysphagia - Stable. 3. Thyroid function-This is being followed by his PCP, but we offered to check this if it has not been checked recently. 4. Imaging -his PCP is followed up with a chest x-ray that was normal by his report 5. Carotid arteriosclerosis -he had a Doppler with his PCP that was normal. 6. Xerostomia-the patient has medical necessity for dry mouth lozenges and Biotene that he takes on a regular basis. 7. Hoarseness - the patient's voice comes and goes but he does a  lot of throat clearing. I did recommend that he try to minimize the throat clearing so that his voice is more stable."  PAIN:  Are you having pain? No  FALLS: Has patient fallen in last 6 months?  No  LIVING ENVIRONMENT: Lives with: lives with their spouse Lives in: House/apartment  PLOF:  Level of assistance: Independent with ADLs Employment: Retired  PATIENT GOALS    to improve communication in a variety of settings  OBJECTIVE:  TODAY'S TREATMENT:  Reviewed EMST for improve expiratory strength and endurance. Pt completed 5 sets of x5 breaths with min verbal cues for rest breaks. 1/4 turn increase in resistance completed this date prior to ex's. Additional education provided re: benefits and rationale of EMST. Pt to complete via HEP.   Reviewed conversation training therapy. Pt able to ID hoarse voicing vs clear voicing (utilizing his "[name] voice)." Pt with tendency to speak at a lower volume which results in additional hoarseness. Biofeedback was provided with use of Voce Analyst app with adequate vocal intensity (~76 dB) during a 15 minute conversation sample.  Mild-moderate hoarseness persists which improves with loudness and forward resonance. Pt able to self correct >80% of the time, min cues needed.   Pt endorsed improved vocal quality in the AM for the last week as well as successful completion of a telephone call. Overall, pt feels like his voice is improving.    PATIENT EDUCATION: Education details: as above Person educated: Patient Education method: Explanation Education comprehension: verbalized understanding and needs further education    GOALS: Goals reviewed with patient? Yes  SHORT TERM GOALS: Target date: 10 sessions  The patient will demonstrate abdominal breathing patterns and steady release of breath on exhalation to optimize efficiency of voicing and decrease laryngeal hyperfunction with rate cues.   Baseline: Goal status: MET  2.  The patient will  complete HEP (Maximum duration "ah", High/Lows, and Functional Phrases) at average loudness >/= 80 dB and with loud, good quality voice with min cues  Baseline:  Goal status: MET  3.  The patient will complete Hierarchal Speech Loudness reading drills (words/phrases, sentences) at average >/= 75 dB and with loud, good quality voice with min cues.  Baseline:  Goal status: MET  4.  The patient will participate in 5-8 minutes conversation, maintaining average loudness of 75 dB and good quality voice with modified independence.  Baseline:  Goal status: IN PROGRESS  5. The patient will demonstrate understanding of vocal hygiene concepts with min A. Baseline:  Goal status: IN PROGRESS     LONG TERM GOALS: Target date: 12 weeks, 06/01/23  The patient will demonstrate abdominal breathing patterns and steady release of breath on exhalation to optimize efficiency of voicing and decrease laryngeal hyperfunction with rare cues.   Baseline: Goal status: IN PROGRESS  The patient will complete HEP (Maximum duration "ah", High/Lows, and Functional Phrases) at average loudness >/= 80 dB and with  loud, good quality voice indep. Baseline:  Goal status: IN PROGRESS    The patient will participate in 15-20 minutes conversation, maintaining average loudness of 75 dB and good quality voice with modified independence.  Baseline:  Goal status: IN PROGRESS  4. Pt will report improvement in communication per PROM. Baseline: CES 19/32 Goal status: IN PROGRESS  ASSESSMENT:  CLINICAL IMPRESSION: Patient is a 83 y.o. male who was seen today for voice/motor-speech treatment in setting of Parkinsonism. 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 Pt presents with s/sx hypokinetic dysarthria c/b hypophonia, hoarse/strained, occasionally breathy voice. Pt with tendency to speak on residual capacity and utilize thoracic and  clavicular breathing patterns. Pt with reports of dissatisfaction with ability to verbally communicate in social settings or at a distance from a communication partner. See details of tx session above. Recommend course of ST with an intensity based approach as well as additional education re: vocal changes in Parkinsonism and vocal hygiene.  OBJECTIVE IMPAIRMENTS include voice disorder. These impairments are limiting patient from effectively communicating at home and in community. Factors affecting potential to achieve goals and functional outcome are co-morbidities. Patient will benefit from skilled SLP services to address above impairments and improve overall function.  REHAB POTENTIAL: Good  PLAN: SLP FREQUENCY: 1-2x/week  SLP DURATION: 12 weeks  PLANNED INTERVENTIONS: Environmental controls, Cueing hierachy, Internal/external aids, Functional tasks, Multimodal communication approach, SLP instruction and feedback, Compensatory strategies, and Patient/family education    Clyde Canterbury, M.S., CCC-SLP Speech-Language Pathologist [facility] [phone] Arnette Felts)  [facility] at [facility] [address] [place], [place], [address] Phone: [phone]   Fax:  [phone]

## 2023-04-22 ENCOUNTER — Ambulatory Visit: Payer: Medicare Other

## 2023-04-27 ENCOUNTER — Ambulatory Visit: Payer: Medicare Other | Admitting: Family Medicine

## 2023-04-27 ENCOUNTER — Ambulatory Visit (INDEPENDENT_AMBULATORY_CARE_PROVIDER_SITE_OTHER): Payer: Medicare Other | Admitting: Family Medicine

## 2023-04-27 ENCOUNTER — Encounter: Payer: Self-pay | Admitting: Family Medicine

## 2023-04-27 ENCOUNTER — Ambulatory Visit: Payer: Medicare Other

## 2023-04-27 VITALS — BP 134/82 | HR 57 | Temp 97.8°F | Ht 69.0 in | Wt 176.4 lb

## 2023-04-27 DIAGNOSIS — G20C Parkinsonism, unspecified: Secondary | ICD-10-CM

## 2023-04-27 DIAGNOSIS — R195 Other fecal abnormalities: Secondary | ICD-10-CM | POA: Diagnosis not present

## 2023-04-27 DIAGNOSIS — I480 Paroxysmal atrial fibrillation: Secondary | ICD-10-CM | POA: Diagnosis not present

## 2023-04-27 DIAGNOSIS — R471 Dysarthria and anarthria: Secondary | ICD-10-CM

## 2023-04-27 DIAGNOSIS — R498 Other voice and resonance disorders: Secondary | ICD-10-CM

## 2023-04-27 NOTE — Patient Instructions (Signed)
 Use miralax as needed.  Let me know if you have more troubles.  Take care.  Glad to see you.

## 2023-04-27 NOTE — Progress Notes (Unsigned)
 About 1 month ago had upset stomach and gurgling.  Had soft thin BMs.  Mult BMs per day.  That eventually cleared but then more recently had constipation.  He was prev off miralax, then restarted recently.  No gurgling.  BM yesterday was harder.  Prev with dec in urination but that normalized.  No clear trigger for episode.  No blood in stool.  No greasy stools.  No fevers.  No black stools.  No abd pain.  No vomiting.    He has similar sx/episode after having covid and that normalized.  No FH colon cancer.    He had unremarkable colonoscopy at 27 and age out.    He is in speech therapy for his voice.  It has helped some, d/w pt.   He had lower AF rates on his monitor with stopping fish oil.    Meds, vitals, and allergies reviewed.   ROS: Per HPI unless specifically indicated in ROS section   GEN: nad, alert and oriented HEENT: ncat NECK: supple w/o LA CV: rrr PULM: ctab, no inc wob ABD: soft, +bs EXT: no edema SKIN: well perfused.

## 2023-04-27 NOTE — Therapy (Signed)
 OUTPATIENT SPEECH LANGUAGE PATHOLOGY [facility]'S [facility]REATMENT   Patient Name: Norman Mitchell MRN: [id number] DOB:[birth year], 83 y.o., male Today's Date: 04/27/2023  PCP: Crawford Givens, MD  REFERRING PROVIDER: Kerin Salen, DO   End of Session - 04/27/23 1359     Visit Number 13    Number of Visits 24    Date for SLP Re-Evaluation 06/01/23    SLP Start Time 1310    SLP Stop Time  1355    SLP Time Calculation (min) 45 min    Activity Tolerance Patient tolerated treatment well             Past Medical History:  Diagnosis Date   Arthritis    Hands, knee   Back pain    Complication of anesthesia    Was told after 1 surgery that his HR went "really low".  Says his normal HR is 50-55.   DVT (deep venous thrombosis) (HCC) 2017   Bilateral knees.  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   Hypothyroidism    Oral cancer (HCC)    Status post radiation.   Paroxysmal atrial fibrillation (HCC)    Prostatitis    Scoliosis    Thrombocytopenia (HCC)    Venous insufficiency    legs   Past Surgical History:  Procedure Laterality Date   CATARACT EXTRACTION Bilateral    KNEE ARTHROSCOPY     KNEE ARTHROSCOPY WITH MEDIAL MENISECTOMY Right 01/09/2020   Procedure: RIGHT KNEE ARTHROSCOPY WITH MEDIAL MENISECTOMY;  Surgeon: Juanell Fairly, MD;  Location: [facility];  Service: Orthopedics;  Laterality: Right;   Oral biopsy     PILONIDAL CYST EXCISION     PROSTATE BIOPSY     Negative   SEPTOPLASTY     SHOULDER ARTHROSCOPY WITH ROTATOR CUFF REPAIR AND SUBACROMIAL DECOMPRESSION Right 07/02/2020   Procedure: RIGHT SHOULDER ARTHROSCOPY  DISTAL CLAVICLE EXCISION, BICEPS TENODESIS, SUBACROMIAL DECOMPRESSION, ANTERIOR LABRAL REPAIR. AND MINI OPEN ROTATOR CUFF REPAIR;  Surgeon: Juanell Fairly, MD;  Location: [facility];  Service: Orthopedics;  Laterality: Right;   TONSILLECTOMY     Patient Active Problem List   Diagnosis Date Noted   Medicare annual wellness visit, subsequent 11/29/2022    Tremor 11/29/2022   GERD (gastroesophageal reflux disease) 03/15/2022   Irregular heart rhythm 11/05/2021   White coat syndrome with high blood pressure without hypertension 11/05/2021   Paresthesia 11/10/2020   Erectile dysfunction 11/10/2020   PSA elevation 11/10/2020   Osteoarthritis of right knee 06/21/2020   Synovitis of knee 06/21/2020   Partial thickness rotator cuff tear 06/21/2020   Knee pain 11/12/2019   Actinic keratosis 11/12/2019   Scoliosis 01/09/2019   Advance care planning 12/28/2018   Healthcare maintenance 12/28/2018   Venous insufficiency    Thrombocytopenia (HCC)    Prostatitis    Paroxysmal atrial fibrillation (HCC)    Hypothyroidism    History of oropharyngeal cancer 10/03/2018   Essential hypertension 08/12/2015   Palpitations 08/12/2015    ONSET DATE: 02/12/2023 (referral date)    REFERRING DIAG: Parkinsonism  THERAPY DIAG:  Primary parkinsonism (HCC)  Hypophonia  Dysarthria and anarthria  Rationale for Evaluation and Treatment Rehabilitation  SUBJECTIVE:   SUBJECTIVE STATEMENT: Pt alert, pleasant, and cooperative Pt accompanied by: self  PERTINENT HISTORY: as above  DIAGNOSTIC FINDINGS:  From ENT note, "1. 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 He is 5 years out from treatment and is considered cured from cancer. I will follow  up with him in 1 year for routine surveillance. 2. Dysphagia - Stable. 3. Thyroid function-This is being followed by his PCP, but we offered to check this if it has not been checked recently. 4. Imaging -his PCP is followed up with a chest x-ray that was normal by his report 5. Carotid arteriosclerosis -he had a Doppler with his PCP that was normal. 6. Xerostomia-the patient has medical necessity for dry mouth lozenges and Biotene that he takes on a regular basis. 7. Hoarseness - the patient's voice comes and goes but he does a  lot of throat clearing. I did recommend that he try to minimize the throat clearing so that his voice is more stable."  PAIN:  Are you having pain? No  FALLS: Has patient fallen in last 6 months?  No  LIVING ENVIRONMENT: Lives with: lives with their spouse Lives in: House/apartment  PLOF:  Level of assistance: Independent with ADLs Employment: Retired  PATIENT GOALS    to improve communication in a variety of settings  OBJECTIVE:  TODAY'S TREATMENT:  Reviewed EMST for improve expiratory strength and endurance. Pt completed 5 sets of x5 breaths with min verbal cues for rest breaks. Pt endorsed difficulty with increased expiratory pressure, returned to last weeks threshold  prior to completing ex's. Additional education provided re: benefits and rationale of EMST. Pt to complete via HEP.   Reviewed conversation training therapy. Pt able to ID hoarse voicing vs clear voicing (utilizing his "[name] voice)." Pt with tendency to speak at a lower volume which results in additional hoarseness. Biofeedback was provided with use of Voce Analyst app with adequate vocal intensity (~74dB) during a 15 minute conversation sample.  Mild-moderate hoarseness persists which improves with loudness and forward resonance. Pt able to self correct >80% of the time, min cues needed.   Pt endorsed improved vocal quality with "more good days than bad." Pt seems generally pleased with progress to date.    PATIENT EDUCATION: Education details: as above Person educated: Patient Education method: Explanation Education comprehension: verbalized understanding and needs further education    GOALS: Goals reviewed with patient? Yes  SHORT TERM GOALS: Target date: 10 sessions  The patient will demonstrate abdominal breathing patterns and steady release of breath on exhalation to optimize efficiency of voicing and decrease laryngeal hyperfunction with rate cues.   Baseline: Goal status: MET  2.  The patient  will complete HEP (Maximum duration "ah", High/Lows, and Functional Phrases) at average loudness >/= 80 dB and with loud, good quality voice with min cues  Baseline:  Goal status: MET  3.  The patient will complete Hierarchal Speech Loudness reading drills (words/phrases, sentences) at average >/= 75 dB and with loud, good quality voice with min cues.  Baseline:  Goal status: MET  4.  The patient will participate in 5-8 minutes conversation, maintaining average loudness of 75 dB and good quality voice with modified independence.  Baseline:  Goal status: IN PROGRESS  5. The patient will demonstrate understanding of vocal hygiene concepts with min A. Baseline:  Goal status: IN PROGRESS     LONG TERM GOALS: Target date: 12 weeks, 06/01/23  The patient will demonstrate abdominal breathing patterns and steady release of breath on exhalation to optimize efficiency of voicing and decrease laryngeal hyperfunction with rare cues.   Baseline: Goal status: IN PROGRESS  The patient will complete HEP (Maximum duration "ah", High/Lows, and Functional Phrases) at average loudness >/= 80 dB and with loud, good quality voice indep.  Baseline:  Goal status: IN PROGRESS    The patient will participate in 15-20 minutes conversation, maintaining average loudness of 75 dB and good quality voice with modified independence.  Baseline:  Goal status: IN PROGRESS  4. Pt will report improvement in communication per PROM. Baseline: CES 19/32 Goal status: IN PROGRESS  ASSESSMENT:  CLINICAL IMPRESSION: Patient is a 83 y.o. male who was seen today for voice/motor-speech treatment in setting of Parkinsonism. 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 Pt presents with s/sx hypokinetic dysarthria c/b hypophonia, hoarse/strained, occasionally breathy voice. Pt with tendency to speak on residual capacity and utilize thoracic and  clavicular breathing patterns. Pt with reports of dissatisfaction with ability to verbally communicate in social settings or at a distance from a communication partner. See details of tx session above. Recommend course of ST with an intensity based approach as well as additional education re: vocal changes in Parkinsonism and vocal hygiene.  OBJECTIVE IMPAIRMENTS include voice disorder. These impairments are limiting patient from effectively communicating at home and in community. Factors affecting potential to achieve goals and functional outcome are co-morbidities. Patient will benefit from skilled SLP services to address above impairments and improve overall function.  REHAB POTENTIAL: Good  PLAN: SLP FREQUENCY: 1-2x/week  SLP DURATION: 12 weeks  PLANNED INTERVENTIONS: Environmental controls, Cueing hierachy, Internal/external aids, Functional tasks, Multimodal communication approach, SLP instruction and feedback, Compensatory strategies, and Patient/family education    Clyde Canterbury, M.S., CCC-SLP Speech-Language Pathologist [place] [facility] [phone] Arnette Felts)  [facility] at [facility] [address] [place], [place], [address] Phone: [phone]   Fax:  [phone]

## 2023-04-28 DIAGNOSIS — R195 Other fecal abnormalities: Secondary | ICD-10-CM | POA: Insufficient documentation

## 2023-04-28 NOTE — Assessment & Plan Note (Signed)
 He had lower AF rates on his monitor with stopping fish oil.  He found a study about that, discussed.  See scanned report from patient's monitor. Discussed.  Would avoid fish oil.

## 2023-04-28 NOTE — Assessment & Plan Note (Signed)
 Normalized now with no abd pain.  Use miralax as needed.  He can let me know if having more troubles. He agrees.

## 2023-04-29 ENCOUNTER — Ambulatory Visit: Payer: Medicare Other

## 2023-04-29 DIAGNOSIS — G20C Parkinsonism, unspecified: Secondary | ICD-10-CM | POA: Diagnosis not present

## 2023-04-29 DIAGNOSIS — R498 Other voice and resonance disorders: Secondary | ICD-10-CM

## 2023-04-29 DIAGNOSIS — R471 Dysarthria and anarthria: Secondary | ICD-10-CM

## 2023-04-29 NOTE — Therapy (Signed)
 OUTPATIENT SPEECH LANGUAGE PATHOLOGY [facility]'S TREATMENT   Patient Name: Norman Mitchell MRN: [id number] DOB:06/25/1940, 83 y.o., male Today's Date: 04/29/2023  PCP: Norman Givens, MD  REFERRING PROVIDER: Kerin Salen, DO   End of Session - 04/29/23 1636     Visit Number 14    Number of Visits 24    Date for SLP Re-Evaluation 06/01/23    SLP Start Time 1530    SLP Stop Time  1620    SLP Time Calculation (min) 50 min    Activity Tolerance Patient tolerated treatment well             Past Medical History:  Diagnosis Date   Arthritis    Hands, knee   Back pain    Complication of anesthesia    Was told after 1 surgery that his HR went "really low".  Says his normal HR is 50-55.   DVT (deep venous thrombosis) (HCC) 2017   Bilateral knees.  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   Hypothyroidism    Oral cancer (HCC)    Status post radiation.   Paroxysmal atrial fibrillation (HCC)    Prostatitis    Scoliosis    Thrombocytopenia (HCC)    Venous insufficiency    legs   Past Surgical History:  Procedure Laterality Date   CATARACT EXTRACTION Bilateral    KNEE ARTHROSCOPY     KNEE ARTHROSCOPY WITH MEDIAL MENISECTOMY Right 01/09/2020   Procedure: RIGHT KNEE ARTHROSCOPY WITH MEDIAL MENISECTOMY;  Surgeon: Norman Fairly, MD;  Location: [facility];  Service: Orthopedics;  Laterality: Right;   Oral biopsy     PILONIDAL CYST EXCISION     PROSTATE BIOPSY     Negative   SEPTOPLASTY     SHOULDER ARTHROSCOPY WITH ROTATOR CUFF REPAIR AND SUBACROMIAL DECOMPRESSION Right 07/02/2020   Procedure: RIGHT SHOULDER ARTHROSCOPY  DISTAL CLAVICLE EXCISION, BICEPS TENODESIS, SUBACROMIAL DECOMPRESSION, ANTERIOR LABRAL REPAIR. AND MINI OPEN ROTATOR CUFF REPAIR;  Surgeon: Norman Fairly, MD;  Location: [facility];  Service: Orthopedics;  Laterality: Right;   TONSILLECTOMY     Patient Active Problem List   Diagnosis Date Noted   Change in stool 04/28/2023   Medicare annual wellness  visit, subsequent 11/29/2022   Tremor 11/29/2022   GERD (gastroesophageal reflux disease) 03/15/2022   Irregular heart rhythm 11/05/2021   White coat syndrome with Mitchell blood pressure without hypertension 11/05/2021   Paresthesia 11/10/2020   Erectile dysfunction 11/10/2020   PSA elevation 11/10/2020   Osteoarthritis of right knee 06/21/2020   Synovitis of knee 06/21/2020   Partial thickness rotator cuff tear 06/21/2020   Knee pain 11/12/2019   Actinic keratosis 11/12/2019   Scoliosis 01/09/2019   Advance care planning 12/28/2018   Healthcare maintenance 12/28/2018   Venous insufficiency    Thrombocytopenia (HCC)    Prostatitis    Paroxysmal atrial fibrillation (HCC)    Hypothyroidism    History of oropharyngeal cancer 10/03/2018   Essential hypertension 08/12/2015   Palpitations 08/12/2015    ONSET DATE: 02/12/2023 (referral date)    REFERRING DIAG: Parkinsonism  THERAPY DIAG:  Primary parkinsonism (HCC)  Hypophonia  Dysarthria and anarthria  Rationale for Evaluation and Treatment Rehabilitation  SUBJECTIVE:   SUBJECTIVE STATEMENT: Pt alert, pleasant, and cooperative Pt accompanied by: self  PERTINENT HISTORY: as above  DIAGNOSTIC FINDINGS:  From ENT note, "1. 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 He is 5 years out from treatment and is considered  cured from cancer. I will follow up with him in 1 year for routine surveillance. 2. Dysphagia - Stable. 3. Thyroid function-This is being followed by his PCP, but we offered to check this if it has not been checked recently. 4. Imaging -his PCP is followed up with a chest x-ray that was normal by his report 5. Carotid arteriosclerosis -he had a Doppler with his PCP that was normal. 6. Xerostomia-the patient has medical necessity for dry mouth lozenges and Biotene that he takes on a regular basis. 7. Hoarseness - the patient's voice  comes and goes but he does a lot of throat clearing. I did recommend that he try to minimize the throat clearing so that his voice is more stable."  PAIN:  Are you having pain? No  FALLS: Has patient fallen in last 6 months?  No  LIVING ENVIRONMENT: Lives with: lives with their spouse Lives in: House/apartment  PLOF:  Level of assistance: Independent with ADLs Employment: Retired  PATIENT GOALS    to improve communication in a variety of settings  OBJECTIVE:  TODAY'S TREATMENT:  Reviewed EMST for improve expiratory strength and endurance. Pt completed 5 sets of x5 breaths with min verbal cues for rest breaks. Pt to complete via HEP.   Reviewed conversation training therapy. Pt able to ID hoarse voicing vs clear voicing (utilizing his "Norman Mitchell voice)." Pt with tendency to speak at a lower volume which results in additional hoarseness. Biofeedback was provided with use of Voce Analyst app with adequate vocal intensity (~75dB) during a 20 minute conversation sample. Sample obtained with and without background noise (track of restaurant noise).  Moderate hoarseness persists which improves with loudness and forward resonance. Increased hoarseness this date which pt attributes to lengthy conversation with neighbor this date.    PATIENT EDUCATION: Education details: as above Person educated: Patient Education method: Explanation Education comprehension: verbalized understanding and needs further education    GOALS: Goals reviewed with patient? Yes  SHORT TERM GOALS: Target date: 10 sessions  The patient will demonstrate abdominal breathing patterns and steady release of breath on exhalation to optimize efficiency of voicing and decrease laryngeal hyperfunction with rate cues.   Baseline: Goal status: MET  2.  The patient will complete HEP (Maximum duration "ah", Mitchell/Lows, and Functional Phrases) at average loudness >/= 80 dB and with loud, good quality voice with min cues   Baseline:  Goal status: MET  3.  The patient will complete Hierarchal Speech Loudness reading drills (words/phrases, sentences) at average >/= 75 dB and with loud, good quality voice with min cues.  Baseline:  Goal status: MET  4.  The patient will participate in 5-8 minutes conversation, maintaining average loudness of 75 dB and good quality voice with modified independence.  Baseline:  Goal status: IN PROGRESS  5. The patient will demonstrate understanding of vocal hygiene concepts with min A. Baseline:  Goal status: IN PROGRESS     LONG TERM GOALS: Target date: 12 weeks, 06/01/23  The patient will demonstrate abdominal breathing patterns and steady release of breath on exhalation to optimize efficiency of voicing and decrease laryngeal hyperfunction with rare cues.   Baseline: Goal status: IN PROGRESS  The patient will complete HEP (Maximum duration "ah", Mitchell/Lows, and Functional Phrases) at average loudness >/= 80 dB and with loud, good quality voice indep. Baseline:  Goal status: IN PROGRESS    The patient will participate in 15-20 minutes conversation, maintaining average loudness of 75 dB and good quality voice with  modified independence.  Baseline:  Goal status: IN PROGRESS  4. Pt will report improvement in communication per PROM. Baseline: CES 19/32 Goal status: IN PROGRESS  ASSESSMENT:  CLINICAL IMPRESSION: Patient is a 83 y.o. male who was seen today for voice/motor-speech treatment in setting of Parkinsonism. 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 Pt presents with s/sx hypokinetic dysarthria c/b hypophonia, hoarse/strained, occasionally breathy voice. Pt with tendency to speak on residual capacity and utilize thoracic and clavicular breathing patterns. Pt with reports of dissatisfaction with ability to verbally communicate in social settings or at a distance from a  communication partner. See details of tx session above. Recommend course of ST with an intensity based approach as well as additional education re: vocal changes in Parkinsonism and vocal hygiene.  OBJECTIVE IMPAIRMENTS include voice disorder. These impairments are limiting patient from effectively communicating at home and in community. Factors affecting potential to achieve goals and functional outcome are co-morbidities. Patient will benefit from skilled SLP services to address above impairments and improve overall function.  REHAB POTENTIAL: Good  PLAN: SLP FREQUENCY: 1-2x/week  SLP DURATION: 12 weeks  PLANNED INTERVENTIONS: Environmental controls, Cueing hierachy, Internal/external aids, Functional tasks, Multimodal communication approach, SLP instruction and feedback, Compensatory strategies, and Patient/family education    Clyde Canterbury, M.S., CCC-SLP Speech-Language Pathologist [place] [facility] [phone] Arnette Felts)   [facility] Outpatient Rehabilitation at [facility] [address] [place], [place], [address] Phone: [phone]   Fax:  [phone]

## 2023-05-04 ENCOUNTER — Ambulatory Visit: Payer: Medicare Other | Attending: Neurology

## 2023-05-04 DIAGNOSIS — R498 Other voice and resonance disorders: Secondary | ICD-10-CM | POA: Diagnosis present

## 2023-05-04 DIAGNOSIS — R471 Dysarthria and anarthria: Secondary | ICD-10-CM | POA: Diagnosis present

## 2023-05-04 DIAGNOSIS — G20C Parkinsonism, unspecified: Secondary | ICD-10-CM | POA: Insufficient documentation

## 2023-05-04 NOTE — Therapy (Signed)
 OUTPATIENT SPEECH LANGUAGE PATHOLOGY [facility]   Patient Name: Norman Mitchell MRN: [id number] DOB:06/08/1940, 83 y.o., male Today's Date: 05/04/2023  PCP: Crawford Givens, MD  REFERRING PROVIDER: Kerin Salen, DO   End of Session - 05/04/23 1536     Visit Number 15    Number of Visits 24    Date for SLP Re-Evaluation 06/01/23    SLP Start Time 1321    SLP Stop Time  1400    SLP Time Calculation (min) 39 min    Activity Tolerance Patient tolerated treatment well             Past Medical History:  Diagnosis Date   Arthritis    Hands, knee   Back pain    Complication of anesthesia    Was told after 1 surgery that his HR went "really low".  Says his normal HR is 50-55.   DVT (deep venous thrombosis) (HCC) 2017   Bilateral knees.  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   Hypothyroidism    Oral cancer (HCC)    Status post radiation.   Paroxysmal atrial fibrillation (HCC)    Prostatitis    Scoliosis    Thrombocytopenia (HCC)    Venous insufficiency    legs   Past Surgical History:  Procedure Laterality Date   CATARACT EXTRACTION Bilateral    KNEE ARTHROSCOPY     KNEE ARTHROSCOPY WITH MEDIAL MENISECTOMY Right 01/09/2020   Procedure: RIGHT KNEE ARTHROSCOPY WITH MEDIAL MENISECTOMY;  Surgeon: Juanell Fairly, MD;  Location: [facility];  Service: Orthopedics;  Laterality: Right;   Oral biopsy     PILONIDAL CYST EXCISION     PROSTATE BIOPSY     Negative   SEPTOPLASTY     SHOULDER ARTHROSCOPY WITH ROTATOR CUFF REPAIR AND SUBACROMIAL DECOMPRESSION Right 07/02/2020   Procedure: RIGHT SHOULDER ARTHROSCOPY  DISTAL CLAVICLE EXCISION, BICEPS TENODESIS, SUBACROMIAL DECOMPRESSION, ANTERIOR LABRAL REPAIR. AND MINI OPEN ROTATOR CUFF REPAIR;  Surgeon: Juanell Fairly, MD;  Location: [facility];  Service: Orthopedics;  Laterality: Right;   TONSILLECTOMY     Patient Active Problem List   Diagnosis Date Noted   Change in stool 04/28/2023   Medicare annual wellness  visit, subsequent 11/29/2022   Tremor 11/29/2022   GERD (gastroesophageal reflux disease) 03/15/2022   Irregular heart rhythm 11/05/2021   White coat syndrome with high blood pressure without hypertension 11/05/2021   Paresthesia 11/10/2020   Erectile dysfunction 11/10/2020   PSA elevation 11/10/2020   Osteoarthritis of right knee 06/21/2020   Synovitis of knee 06/21/2020   Partial thickness rotator cuff tear 06/21/2020   Knee pain 11/12/2019   Actinic keratosis 11/12/2019   Scoliosis 01/09/2019   Advance care planning 12/28/2018   Healthcare maintenance 12/28/2018   Venous insufficiency    Thrombocytopenia (HCC)    Prostatitis    Paroxysmal atrial fibrillation (HCC)    Hypothyroidism    History of oropharyngeal cancer 10/03/2018   Essential hypertension 08/12/2015   Palpitations 08/12/2015    ONSET DATE: 02/12/2023 (referral date)    REFERRING DIAG: Parkinsonism  THERAPY DIAG:  Primary parkinsonism (HCC)  Hypophonia  Dysarthria and anarthria  Rationale for Evaluation and Treatment Rehabilitation  SUBJECTIVE:   SUBJECTIVE STATEMENT: Pt alert, pleasant, and cooperative Pt accompanied by: self  PERTINENT HISTORY: as above  DIAGNOSTIC FINDINGS:  From ENT note, "1. 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 He is 5 years out from treatment and is considered  cured from cancer. I will follow up with him in 1 year for routine surveillance. 2. Dysphagia - Stable. 3. Thyroid function-This is being followed by his PCP, but we offered to check this if it has not been checked recently. 4. Imaging -his PCP is followed up with a chest x-ray that was normal by his report 5. Carotid arteriosclerosis -he had a Doppler with his PCP that was normal. 6. Xerostomia-the patient has medical necessity for dry mouth lozenges and Biotene that he takes on a regular basis. 7. Hoarseness - the patient's voice  comes and goes but he does a lot of throat clearing. I did recommend that he try to minimize the throat clearing so that his voice is more stable."  PAIN:  Are you having pain? No  FALLS: Has patient fallen in last 6 months?  No  LIVING ENVIRONMENT: Lives with: lives with their spouse Lives in: House/apartment  PLOF:  Level of assistance: Independent with ADLs Employment: Retired  PATIENT GOALS    to improve communication in a variety of settings  OBJECTIVE:  TODAY'S TREATMENT:  Reviewed EMST for improve expiratory strength and endurance. Pt left EMST at home, but will continue to utilize via HEP.  Reviewed conversation training therapy. Pt able to ID hoarse voicing vs clear voicing (utilizing his "[name] voice)." Pt with tendency to speak at a lower volume which results in additional hoarseness. Biofeedback was provided with use of Voice Analyst app with evidence of mil vocal decay during a 20 minute conversation sample, averaging ~74 dB. Mild-moderate hoarseness persists which improves with loudness and forward resonance.   Pt endorsed reduced frequency of aphonia/loss of voice since initiating ST.   PATIENT EDUCATION: Education details: as above Person educated: Patient Education method: Explanation Education comprehension: verbalized understanding and needs further education    GOALS: Goals reviewed with patient? Yes  SHORT TERM GOALS: Target date: 10 sessions  The patient will demonstrate abdominal breathing patterns and steady release of breath on exhalation to optimize efficiency of voicing and decrease laryngeal hyperfunction with rate cues.   Baseline: Goal status: MET  2.  The patient will complete HEP (Maximum duration "ah", High/Lows, and Functional Phrases) at average loudness >/= 80 dB and with loud, good quality voice with min cues  Baseline:  Goal status: MET  3.  The patient will complete Hierarchal Speech Loudness reading drills (words/phrases,  sentences) at average >/= 75 dB and with loud, good quality voice with min cues.  Baseline:  Goal status: MET  4.  The patient will participate in 5-8 minutes conversation, maintaining average loudness of 75 dB and good quality voice with modified independence.  Baseline:  Goal status: IN PROGRESS  5. The patient will demonstrate understanding of vocal hygiene concepts with min A. Baseline:  Goal status: IN PROGRESS     LONG TERM GOALS: Target date: 12 weeks, 06/01/23  The patient will demonstrate abdominal breathing patterns and steady release of breath on exhalation to optimize efficiency of voicing and decrease laryngeal hyperfunction with rare cues.   Baseline: Goal status: IN PROGRESS  The patient will complete HEP (Maximum duration "ah", High/Lows, and Functional Phrases) at average loudness >/= 80 dB and with loud, good quality voice indep. Baseline:  Goal status: IN PROGRESS    The patient will participate in 15-20 minutes conversation, maintaining average loudness of 75 dB and good quality voice with modified independence.  Baseline:  Goal status: IN PROGRESS  4. Pt will report improvement in communication per  PROM. Baseline: CES 19/32 Goal status: IN PROGRESS  ASSESSMENT:  CLINICAL IMPRESSION: Patient is a 83 y.o. male who was seen today for voice/motor-speech treatment in setting of Parkinsonism. 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 Pt presents with s/sx hypokinetic dysarthria c/b hypophonia, hoarse/strained, occasionally breathy voice. Pt with tendency to speak on residual capacity and utilize thoracic and clavicular breathing patterns. Pt with reports of dissatisfaction with ability to verbally communicate in social settings or at a distance from a communication partner. See details of tx session above. Recommend course of ST with an intensity based approach as well as  additional education re: vocal changes in Parkinsonism and vocal hygiene.  OBJECTIVE IMPAIRMENTS include voice disorder. These impairments are limiting patient from effectively communicating at home and in community. Factors affecting potential to achieve goals and functional outcome are co-morbidities. Patient will benefit from skilled SLP services to address above impairments and improve overall function.  REHAB POTENTIAL: Good  PLAN: SLP FREQUENCY: 1-2x/week  SLP DURATION: 12 weeks  PLANNED INTERVENTIONS: Environmental controls, Cueing hierachy, Internal/external aids, Functional tasks, Multimodal communication approach, SLP instruction and feedback, Compensatory strategies, and Patient/family education    Clyde Canterbury, M.S., CCC-SLP Speech-Language Pathologist [place] [facility] [phone] Arnette Felts)  [facility] at [facility] [address] [place], [place], [address] Phone: [phone]   Fax:  [phone]

## 2023-05-06 ENCOUNTER — Ambulatory Visit: Payer: Medicare Other

## 2023-05-06 DIAGNOSIS — G20C Parkinsonism, unspecified: Secondary | ICD-10-CM

## 2023-05-06 DIAGNOSIS — R498 Other voice and resonance disorders: Secondary | ICD-10-CM

## 2023-05-06 DIAGNOSIS — R471 Dysarthria and anarthria: Secondary | ICD-10-CM

## 2023-05-06 NOTE — Therapy (Signed)
 OUTPATIENT SPEECH LANGUAGE PATHOLOGY [facility]   Patient Name: Norman Mitchell MRN: [id number] DOB:14-May-1940, 83 y.o., male Today's Date: 05/06/2023  PCP: Crawford Givens, MD  REFERRING PROVIDER: Kerin Salen, DO   End of Session - 05/06/23 1631     Visit Number 16    Number of Visits 24    Date for SLP Re-Evaluation 06/01/23    SLP Start Time 1533    SLP Stop Time  1618    SLP Time Calculation (min) 45 min    Activity Tolerance Patient tolerated treatment well             Past Medical History:  Diagnosis Date   Arthritis    Hands, knee   Back pain    Complication of anesthesia    Was told after 1 surgery that his HR went "really low".  Says his normal HR is 50-55.   DVT (deep venous thrombosis) (HCC) 2017   Bilateral knees.  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   Hypothyroidism    Oral cancer (HCC)    Status post radiation.   Paroxysmal atrial fibrillation (HCC)    Prostatitis    Scoliosis    Thrombocytopenia (HCC)    Venous insufficiency    legs   Past Surgical History:  Procedure Laterality Date   CATARACT EXTRACTION Bilateral    KNEE ARTHROSCOPY     KNEE ARTHROSCOPY WITH MEDIAL MENISECTOMY Right 01/09/2020   Procedure: RIGHT KNEE ARTHROSCOPY WITH MEDIAL MENISECTOMY;  Surgeon: Juanell Fairly, MD;  Location: [facility];  Service: Orthopedics;  Laterality: Right;   Oral biopsy     PILONIDAL CYST EXCISION     PROSTATE BIOPSY     Negative   SEPTOPLASTY     SHOULDER ARTHROSCOPY WITH ROTATOR CUFF REPAIR AND SUBACROMIAL DECOMPRESSION Right 07/02/2020   Procedure: RIGHT SHOULDER ARTHROSCOPY  DISTAL CLAVICLE EXCISION, BICEPS TENODESIS, SUBACROMIAL DECOMPRESSION, ANTERIOR LABRAL REPAIR. AND MINI OPEN ROTATOR CUFF REPAIR;  Surgeon: Juanell Fairly, MD;  Location: [facility];  Service: Orthopedics;  Laterality: Right;   TONSILLECTOMY     Patient Active Problem List   Diagnosis Date Noted   Change in stool 04/28/2023   Medicare annual wellness  visit, subsequent 11/29/2022   Tremor 11/29/2022   GERD (gastroesophageal reflux disease) 03/15/2022   Irregular heart rhythm 11/05/2021   White coat syndrome with high blood pressure without hypertension 11/05/2021   Paresthesia 11/10/2020   Erectile dysfunction 11/10/2020   PSA elevation 11/10/2020   Osteoarthritis of right knee 06/21/2020   Synovitis of knee 06/21/2020   Partial thickness rotator cuff tear 06/21/2020   Knee pain 11/12/2019   Actinic keratosis 11/12/2019   Scoliosis 01/09/2019   Advance care planning 12/28/2018   Healthcare maintenance 12/28/2018   Venous insufficiency    Thrombocytopenia (HCC)    Prostatitis    Paroxysmal atrial fibrillation (HCC)    Hypothyroidism    History of oropharyngeal cancer 10/03/2018   Essential hypertension 08/12/2015   Palpitations 08/12/2015    ONSET DATE: 02/12/2023 (referral date)    REFERRING DIAG: Parkinsonism  THERAPY DIAG:  Primary parkinsonism (HCC)  Hypophonia  Dysarthria and anarthria  Rationale for Evaluation and Treatment Rehabilitation  SUBJECTIVE:   SUBJECTIVE STATEMENT: Pt alert, pleasant, and cooperative Pt accompanied by: self  PERTINENT HISTORY: as above  DIAGNOSTIC FINDINGS:  From ENT note, "1. 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 He is 5 years out from treatment and is considered  cured from cancer. I will follow up with him in 1 year for routine surveillance. 2. Dysphagia - Stable. 3. Thyroid function-This is being followed by his PCP, but we offered to check this if it has not been checked recently. 4. Imaging -his PCP is followed up with a chest x-ray that was normal by his report 5. Carotid arteriosclerosis -he had a Doppler with his PCP that was normal. 6. Xerostomia-the patient has medical necessity for dry mouth lozenges and Biotene that he takes on a regular basis. 7. Hoarseness - the patient's voice  comes and goes but he does a lot of throat clearing. I did recommend that he try to minimize the throat clearing so that his voice is more stable."  PAIN:  Are you having pain? No  FALLS: Has patient fallen in last 6 months?  No  LIVING ENVIRONMENT: Lives with: lives with their spouse Lives in: House/apartment  PLOF:  Level of assistance: Independent with ADLs Employment: Retired  PATIENT GOALS    to improve communication in a variety of settings  OBJECTIVE:  TODAY'S TREATMENT:  Reviewed EMST for improve expiratory strength and endurance. Pt completed 5 set of 5 breaths independently.  Reviewed conversation training therapy. Pt able to ID hoarse voicing vs clear voicing (utilizing his "[name] voice)." Pt with tendency to speak at a lower volume which results in additional hoarseness. Biofeedback was provided with use of Voice Analyst app without vocal decay during a 20 minute conversation sample, averaging ~76 dB. Mild hoarseness persists which improves with loudness and forward resonance.  Pt endorsed not having to preface conversations with "If you can't hear me, just let me know" this week which is likely due to improved vocal loudness in general.   PATIENT EDUCATION: Education details: as above Person educated: Patient Education method: Explanation Education comprehension: verbalized understanding and needs further education    GOALS: Goals reviewed with patient? Yes  SHORT TERM GOALS: Target date: 10 sessions  The patient will demonstrate abdominal breathing patterns and steady release of breath on exhalation to optimize efficiency of voicing and decrease laryngeal hyperfunction with rate cues.   Baseline: Goal status: MET  2.  The patient will complete HEP (Maximum duration "ah", High/Lows, and Functional Phrases) at average loudness >/= 80 dB and with loud, good quality voice with min cues  Baseline:  Goal status: MET  3.  The patient will complete Hierarchal  Speech Loudness reading drills (words/phrases, sentences) at average >/= 75 dB and with loud, good quality voice with min cues.  Baseline:  Goal status: MET  4.  The patient will participate in 5-8 minutes conversation, maintaining average loudness of 75 dB and good quality voice with modified independence.  Baseline:  Goal status: IN PROGRESS  5. The patient will demonstrate understanding of vocal hygiene concepts with min A. Baseline:  Goal status: IN PROGRESS     LONG TERM GOALS: Target date: 12 weeks, 06/01/23  The patient will demonstrate abdominal breathing patterns and steady release of breath on exhalation to optimize efficiency of voicing and decrease laryngeal hyperfunction with rare cues.   Baseline: Goal status: IN PROGRESS  The patient will complete HEP (Maximum duration "ah", High/Lows, and Functional Phrases) at average loudness >/= 80 dB and with loud, good quality voice indep. Baseline:  Goal status: IN PROGRESS    The patient will participate in 15-20 minutes conversation, maintaining average loudness of 75 dB and good quality voice with modified independence.  Baseline:  Goal status: IN  PROGRESS  4. Pt will report improvement in communication per PROM. Baseline: CES 19/32 Goal status: IN PROGRESS  ASSESSMENT:  CLINICAL IMPRESSION: Patient is a 83 y.o. male who was seen today for voice/motor-speech treatment in setting of Parkinsonism. 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 Pt presents with s/sx hypokinetic dysarthria c/b hypophonia, hoarse/strained, occasionally breathy voice. Pt with tendency to speak on residual capacity and utilize thoracic and clavicular breathing patterns. Pt with reports of dissatisfaction with ability to verbally communicate in social settings or at a distance from a communication partner. See details of tx session above. Recommend course of ST with  an intensity based approach as well as additional education re: vocal changes in Parkinsonism and vocal hygiene.  OBJECTIVE IMPAIRMENTS include voice disorder. These impairments are limiting patient from effectively communicating at home and in community. Factors affecting potential to achieve goals and functional outcome are co-morbidities. Patient will benefit from skilled SLP services to address above impairments and improve overall function.  REHAB POTENTIAL: Good  PLAN: SLP FREQUENCY: 1-2x/week  SLP DURATION: 12 weeks  PLANNED INTERVENTIONS: Environmental controls, Cueing hierachy, Internal/external aids, Functional tasks, Multimodal communication approach, SLP instruction and feedback, Compensatory strategies, and Patient/family education    Clyde Canterbury, M.S., CCC-SLP Speech-Language Pathologist [place] [facility] [phone] Arnette Felts)  [facility] Outpatient Rehabilitation at [facility] [address] [place], [place], [address] Phone: [phone]   Fax:  [phone]

## 2023-05-11 ENCOUNTER — Ambulatory Visit: Payer: Medicare Other

## 2023-05-11 DIAGNOSIS — R471 Dysarthria and anarthria: Secondary | ICD-10-CM

## 2023-05-11 DIAGNOSIS — R498 Other voice and resonance disorders: Secondary | ICD-10-CM

## 2023-05-11 DIAGNOSIS — G20C Parkinsonism, unspecified: Secondary | ICD-10-CM | POA: Diagnosis not present

## 2023-05-11 NOTE — Therapy (Signed)
 OUTPATIENT SPEECH LANGUAGE PATHOLOGY [facility]   Patient Name: Norman Mitchell MRN: [id number] DOB:Dec 09, 1940, 83 y.o., male Today's Date: 05/11/2023  PCP: Norman Givens, MD  REFERRING PROVIDER: Kerin Salen, DO   End of Session - 05/11/23 0842     Visit Number 17    Number of Visits 24    Date for SLP Re-Evaluation 06/01/23    SLP Start Time 0750    SLP Stop Time  0840    SLP Time Calculation (min) 50 min    Activity Tolerance Patient tolerated treatment well             Past Medical History:  Diagnosis Date   Arthritis    Hands, knee   Back pain    Complication of anesthesia    Was told after 1 surgery that his HR went "really low".  Says his normal HR is 50-55.   DVT (deep venous thrombosis) (HCC) 2017   Bilateral knees.  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   Hypothyroidism    Oral cancer (HCC)    Status post radiation.   Paroxysmal atrial fibrillation (HCC)    Prostatitis    Scoliosis    Thrombocytopenia (HCC)    Venous insufficiency    legs   Past Surgical History:  Procedure Laterality Date   CATARACT EXTRACTION Bilateral    KNEE ARTHROSCOPY     KNEE ARTHROSCOPY WITH MEDIAL MENISECTOMY Right 01/09/2020   Procedure: RIGHT KNEE ARTHROSCOPY WITH MEDIAL MENISECTOMY;  Surgeon: Norman Fairly, MD;  Location: [facility];  Service: Orthopedics;  Laterality: Right;   Oral biopsy     PILONIDAL CYST EXCISION     PROSTATE BIOPSY     Negative   SEPTOPLASTY     SHOULDER ARTHROSCOPY WITH ROTATOR CUFF REPAIR AND SUBACROMIAL DECOMPRESSION Right 07/02/2020   Procedure: RIGHT SHOULDER ARTHROSCOPY  DISTAL CLAVICLE EXCISION, BICEPS TENODESIS, SUBACROMIAL DECOMPRESSION, ANTERIOR LABRAL REPAIR. AND MINI OPEN ROTATOR CUFF REPAIR;  Surgeon: Norman Fairly, MD;  Location: [facility];  Service: Orthopedics;  Laterality: Right;   TONSILLECTOMY     Patient Active Problem List   Diagnosis Date Noted   Change in stool 04/28/2023   Medicare annual wellness  visit, subsequent 11/29/2022   Tremor 11/29/2022   GERD (gastroesophageal reflux disease) 03/15/2022   Irregular heart rhythm 11/05/2021   White coat syndrome with high blood pressure without hypertension 11/05/2021   Paresthesia 11/10/2020   Erectile dysfunction 11/10/2020   PSA elevation 11/10/2020   Osteoarthritis of right knee 06/21/2020   Synovitis of knee 06/21/2020   Partial thickness rotator cuff tear 06/21/2020   Knee pain 11/12/2019   Actinic keratosis 11/12/2019   Scoliosis 01/09/2019   Advance care planning 12/28/2018   Healthcare maintenance 12/28/2018   Venous insufficiency    Thrombocytopenia (HCC)    Prostatitis    Paroxysmal atrial fibrillation (HCC)    Hypothyroidism    History of oropharyngeal cancer 10/03/2018   Essential hypertension 08/12/2015   Palpitations 08/12/2015    ONSET DATE: 02/12/2023 (referral date)    REFERRING DIAG: Parkinsonism  THERAPY DIAG:  Primary parkinsonism (HCC)  Hypophonia  Dysarthria and anarthria  Rationale for Evaluation and Treatment Rehabilitation  SUBJECTIVE:   SUBJECTIVE STATEMENT: Pt alert, pleasant, and cooperative Pt accompanied by: self  PERTINENT HISTORY: as above  DIAGNOSTIC FINDINGS:  From ENT note, "1. 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 He is 5 years out from treatment and is considered  cured from cancer. I will follow up with him in 1 year for routine surveillance. 2. Dysphagia - Stable. 3. Thyroid function-This is being followed by his PCP, but we offered to check this if it has not been checked recently. 4. Imaging -his PCP is followed up with a chest x-ray that was normal by his report 5. Carotid arteriosclerosis -he had a Doppler with his PCP that was normal. 6. Xerostomia-the patient has medical necessity for dry mouth lozenges and Biotene that he takes on a regular basis. 7. Hoarseness - the patient's voice  comes and goes but he does a lot of throat clearing. I did recommend that he try to minimize the throat clearing so that his voice is more stable."  PAIN:  Are you having pain? No  FALLS: Has patient fallen in last 6 months?  No  LIVING ENVIRONMENT: Lives with: lives with their spouse Lives in: House/apartment  PLOF:  Level of assistance: Independent with ADLs Employment: Retired  PATIENT GOALS    to improve communication in a variety of settings  OBJECTIVE:  TODAY'S TREATMENT:  Reviewed EMST for improve expiratory strength and endurance. Pt completed 5 set of 5 breaths independently. Rest breaks PRN.  Reviewed conversation training therapy. Pt able to ID hoarse voicing vs clear voicing (utilizing his "[name] voice)." Pt with tendency to speak at a lower volume which results in additional hoarseness. Biofeedback was provided with use of Voice Analyst app without vocal decay during a conversation sample, averaging 80 dB. Mild to mild-moderate hoarseness persists which improves with loudness and forward resonance.   PATIENT EDUCATION: Education details: as above Person educated: Patient Education method: Explanation Education comprehension: verbalized understanding and needs further education    GOALS: Goals reviewed with patient? Yes  SHORT TERM GOALS: Target date: 10 sessions  The patient will demonstrate abdominal breathing patterns and steady release of breath on exhalation to optimize efficiency of voicing and decrease laryngeal hyperfunction with rate cues.   Baseline: Goal status: MET  2.  The patient will complete HEP (Maximum duration "ah", High/Lows, and Functional Phrases) at average loudness >/= 80 dB and with loud, good quality voice with min cues  Baseline:  Goal status: MET  3.  The patient will complete Hierarchal Speech Loudness reading drills (words/phrases, sentences) at average >/= 75 dB and with loud, good quality voice with min cues.   Baseline:  Goal status: MET  4.  The patient will participate in 5-8 minutes conversation, maintaining average loudness of 75 dB and good quality voice with modified independence.  Baseline:  Goal status: IN PROGRESS  5. The patient will demonstrate understanding of vocal hygiene concepts with min A. Baseline:  Goal status: IN PROGRESS     LONG TERM GOALS: Target date: 12 weeks, 06/01/23  The patient will demonstrate abdominal breathing patterns and steady release of breath on exhalation to optimize efficiency of voicing and decrease laryngeal hyperfunction with rare cues.   Baseline: Goal status: IN PROGRESS  The patient will complete HEP (Maximum duration "ah", High/Lows, and Functional Phrases) at average loudness >/= 80 dB and with loud, good quality voice indep. Baseline:  Goal status: IN PROGRESS    The patient will participate in 15-20 minutes conversation, maintaining average loudness of 75 dB and good quality voice with modified independence.  Baseline:  Goal status: IN PROGRESS  4. Pt will report improvement in communication per PROM. Baseline: CES 19/32 Goal status: IN PROGRESS  ASSESSMENT:  CLINICAL IMPRESSION: Patient is a  83 y.o. male who was seen today for voice/motor-speech treatment in setting of Parkinsonism. 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 Pt presents with s/sx hypokinetic dysarthria c/b hypophonia, hoarse/strained, occasionally breathy voice. Pt with tendency to speak on residual capacity and utilize thoracic and clavicular breathing patterns. Pt with reports of dissatisfaction with ability to verbally communicate in social settings or at a distance from a communication partner. See details of tx session above. Recommend course of ST with an intensity based approach as well as additional education re: vocal changes in Parkinsonism and vocal hygiene.  OBJECTIVE  IMPAIRMENTS include voice disorder. These impairments are limiting patient from effectively communicating at home and in community. Factors affecting potential to achieve goals and functional outcome are co-morbidities. Patient will benefit from skilled SLP services to address above impairments and improve overall function.  REHAB POTENTIAL: Good  PLAN: SLP FREQUENCY: 1-2x/week  SLP DURATION: 12 weeks  PLANNED INTERVENTIONS: Environmental controls, Cueing hierachy, Internal/external aids, Functional tasks, Multimodal communication approach, SLP instruction and feedback, Compensatory strategies, and Patient/family education    Clyde Canterbury, M.S., CCC-SLP Speech-Language Pathologist [place] [facility] [phone] Arnette Felts)  [facility] Rehabilitation at [facility] [address] [place], [place], [address] Phone: [phone]   Fax:  [phone]

## 2023-05-13 ENCOUNTER — Ambulatory Visit: Payer: Medicare Other

## 2023-05-13 DIAGNOSIS — R471 Dysarthria and anarthria: Secondary | ICD-10-CM

## 2023-05-13 DIAGNOSIS — G20C Parkinsonism, unspecified: Secondary | ICD-10-CM

## 2023-05-13 DIAGNOSIS — R498 Other voice and resonance disorders: Secondary | ICD-10-CM

## 2023-05-13 NOTE — Therapy (Signed)
 OUTPATIENT SPEECH LANGUAGE PATHOLOGY [facility]   Patient Name: Norman Mitchell MRN: [id number] DOB:1940/10/18, 83 y.o., male Today's Date: 05/13/2023  PCP: Crawford Givens, MD  REFERRING PROVIDER: Kerin Salen, DO   End of Session - 05/13/23 1527     Visit Number 18    Number of Visits 24    Date for SLP Re-Evaluation 06/01/23    SLP Start Time 1430    SLP Stop Time  1515    SLP Time Calculation (min) 45 min    Activity Tolerance Patient tolerated treatment well             Past Medical History:  Diagnosis Date   Arthritis    Hands, knee   Back pain    Complication of anesthesia    Was told after 1 surgery that his HR went "really low".  Says his normal HR is 50-55.   DVT (deep venous thrombosis) (HCC) 2017   Bilateral knees.  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   Hypothyroidism    Oral cancer (HCC)    Status post radiation.   Paroxysmal atrial fibrillation (HCC)    Prostatitis    Scoliosis    Thrombocytopenia (HCC)    Venous insufficiency    legs   Past Surgical History:  Procedure Laterality Date   CATARACT EXTRACTION Bilateral    KNEE ARTHROSCOPY     KNEE ARTHROSCOPY WITH MEDIAL MENISECTOMY Right 01/09/2020   Procedure: RIGHT KNEE ARTHROSCOPY WITH MEDIAL MENISECTOMY;  Surgeon: Juanell Fairly, MD;  Location: [facility];  Service: Orthopedics;  Laterality: Right;   Oral biopsy     PILONIDAL CYST EXCISION     PROSTATE BIOPSY     Negative   SEPTOPLASTY     SHOULDER ARTHROSCOPY WITH ROTATOR CUFF REPAIR AND SUBACROMIAL DECOMPRESSION Right 07/02/2020   Procedure: RIGHT SHOULDER ARTHROSCOPY  DISTAL CLAVICLE EXCISION, BICEPS TENODESIS, SUBACROMIAL DECOMPRESSION, ANTERIOR LABRAL REPAIR. AND MINI OPEN ROTATOR CUFF REPAIR;  Surgeon: Juanell Fairly, MD;  Location: [facility];  Service: Orthopedics;  Laterality: Right;   TONSILLECTOMY     Patient Active Problem List   Diagnosis Date Noted   Change in stool 04/28/2023   Medicare annual wellness  visit, subsequent 11/29/2022   Tremor 11/29/2022   GERD (gastroesophageal reflux disease) 03/15/2022   Irregular heart rhythm 11/05/2021   White coat syndrome with high blood pressure without hypertension 11/05/2021   Paresthesia 11/10/2020   Erectile dysfunction 11/10/2020   PSA elevation 11/10/2020   Osteoarthritis of right knee 06/21/2020   Synovitis of knee 06/21/2020   Partial thickness rotator cuff tear 06/21/2020   Knee pain 11/12/2019   Actinic keratosis 11/12/2019   Scoliosis 01/09/2019   Advance care planning 12/28/2018   Healthcare maintenance 12/28/2018   Venous insufficiency    Thrombocytopenia (HCC)    Prostatitis    Paroxysmal atrial fibrillation (HCC)    Hypothyroidism    History of oropharyngeal cancer 10/03/2018   Essential hypertension 08/12/2015   Palpitations 08/12/2015    ONSET DATE: 02/12/2023 (referral date)    REFERRING DIAG: Parkinsonism  THERAPY DIAG:  Primary parkinsonism (HCC)  Hypophonia  Dysarthria and anarthria  Rationale for Evaluation and Treatment Rehabilitation  SUBJECTIVE:   SUBJECTIVE STATEMENT: Pt alert, pleasant, and cooperative Pt accompanied by: self  PERTINENT HISTORY: as above  DIAGNOSTIC FINDINGS:  From ENT note, "1. 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 He is 5 years out from treatment and is considered  cured from cancer. I will follow up with him in 1 year for routine surveillance. 2. Dysphagia - Stable. 3. Thyroid function-This is being followed by his PCP, but we offered to check this if it has not been checked recently. 4. Imaging -his PCP is followed up with a chest x-ray that was normal by his report 5. Carotid arteriosclerosis -he had a Doppler with his PCP that was normal. 6. Xerostomia-the patient has medical necessity for dry mouth lozenges and Biotene that he takes on a regular basis. 7. Hoarseness - the patient's voice  comes and goes but he does a lot of throat clearing. I did recommend that he try to minimize the throat clearing so that his voice is more stable."  PAIN:  Are you having pain? No  FALLS: Has patient fallen in last 6 months?  No  LIVING ENVIRONMENT: Lives with: lives with their spouse Lives in: House/apartment  PLOF:  Level of assistance: Independent with ADLs Employment: Retired  PATIENT GOALS    to improve communication in a variety of settings  OBJECTIVE:  TODAY'S TREATMENT:  Pt underwent laryngoscopy today during visit with Dr. Myer Haff. Per MD note, "Nasopharynx shows no masses Rosenmueller's fossae have no masses Larynx shows no masses with normal true vocal cord motion Normal false vocal cords and normal arytenoids Hypopharynx is normal Pyriform open and clear Eustachian tube orifice clear BOT has no lymphatic tissue, no masses Scar banding in the nasopharynx, likely radiation-related. Telangectasia of the posterior pharyngeal wall  Minimal edema of the larynx. Vocal cords moving, but with bowing."  Reviewed EMST for improve expiratory strength and endurance. Pt completed 5 set of 5 breaths independently. Rest breaks PRN.  Introduced Hospital doctor given results of today's laryngoscopy. Pt completed the following ex's: - Sustain the vowel /a/ with a loud, energized voice for as long as possible. X10 repetitions  - Glide from low to high on the vowel /a/ over the entire pitch range using a loud, energized voice. X10 repetitions; 86dB  - Glide from high to low on the vowel /a/ over the entire pitch range using a loud, energized voice. X10 repetitions; 84dB - Shout functional phrases using a loud and higher-pitched voice, like calling over a fence. X2; 85dB - Using the same phrases from Step 4,  produce a strong, authoritative voice at a low pitch level. X2; 82dB  Pt generated x10 common phrases to be used for HEP.   Pt made aware of rationale  for Phonation Resistance Training ex's as well as HEP.   During conversation sample, pt with increased hoarseness which he attributed to secretions. Mild improvement with throat clearing. Pt averaged ~74dB during 5 minute speech sample. Mild-moderate hoarseness appreciated during conversation vs mild during drill work above.   PATIENT EDUCATION: Education details: as above Person educated: Patient Education method: Explanation Education comprehension: verbalized understanding and needs further education    GOALS: Goals reviewed with patient? Yes  SHORT TERM GOALS: Target date: 10 sessions  The patient will demonstrate abdominal breathing patterns and steady release of breath on exhalation to optimize efficiency of voicing and decrease laryngeal hyperfunction with rate cues.   Baseline: Goal status: MET  2.  The patient will complete HEP (Maximum duration "ah", High/Lows, and Functional Phrases) at average loudness >/= 80 dB and with loud, good quality voice with min cues  Baseline:  Goal status: MET  3.  The patient will complete Hierarchal Speech Loudness reading drills (words/phrases, sentences) at average >/= 75  dB and with loud, good quality voice with min cues.  Baseline:  Goal status: MET  4.  The patient will participate in 5-8 minutes conversation, maintaining average loudness of 75 dB and good quality voice with modified independence.  Baseline:  Goal status: IN PROGRESS  5. The patient will demonstrate understanding of vocal hygiene concepts with min A. Baseline:  Goal status: IN PROGRESS     LONG TERM GOALS: Target date: 12 weeks, 06/01/23  The patient will demonstrate abdominal breathing patterns and steady release of breath on exhalation to optimize efficiency of voicing and decrease laryngeal hyperfunction with rare cues.   Baseline: Goal status: IN PROGRESS  The patient will complete HEP (Maximum duration "ah", High/Lows, and Functional Phrases) at average  loudness >/= 80 dB and with loud, good quality voice indep. Baseline:  Goal status: IN PROGRESS    The patient will participate in 15-20 minutes conversation, maintaining average loudness of 75 dB and good quality voice with modified independence.  Baseline:  Goal status: IN PROGRESS  4. Pt will report improvement in communication per PROM. Baseline: CES 19/32 Goal status: IN PROGRESS  ASSESSMENT:  CLINICAL IMPRESSION: Patient is a 83 y.o. male who was seen today for voice/motor-speech treatment in setting of Parkinsonism. 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 Pt presents with s/sx hypokinetic dysarthria c/b hypophonia, hoarse/strained, occasionally breathy voice. Pt with tendency to speak on residual capacity and utilize thoracic and clavicular breathing patterns. Pt with reports of dissatisfaction with ability to verbally communicate in social settings or at a distance from a communication partner. See details of tx session above. Recommend course of ST with an intensity based approach as well as additional education re: vocal changes in Parkinsonism and vocal hygiene.  OBJECTIVE IMPAIRMENTS include voice disorder. These impairments are limiting patient from effectively communicating at home and in community. Factors affecting potential to achieve goals and functional outcome are co-morbidities. Patient will benefit from skilled SLP services to address above impairments and improve overall function.  REHAB POTENTIAL: Good  PLAN: SLP FREQUENCY: 1-2x/week  SLP DURATION: 12 weeks  PLANNED INTERVENTIONS: Environmental controls, Cueing hierachy, Internal/external aids, Functional tasks, Multimodal communication approach, SLP instruction and feedback, Compensatory strategies, and Patient/family education    Clyde Canterbury, M.S., CCC-SLP Speech-Language Pathologist [place] - [facility] [phone] Arnette Felts)  [facility] Outpatient Rehabilitation at [facility] [address] [place], [place], [address] Phone: [phone]   Fax:  [phone]

## 2023-05-18 ENCOUNTER — Ambulatory Visit: Payer: Medicare Other

## 2023-05-18 DIAGNOSIS — G20C Parkinsonism, unspecified: Secondary | ICD-10-CM | POA: Diagnosis not present

## 2023-05-18 DIAGNOSIS — R498 Other voice and resonance disorders: Secondary | ICD-10-CM

## 2023-05-18 NOTE — Therapy (Signed)
 OUTPATIENT SPEECH LANGUAGE PATHOLOGY P[facility]S TREATMENT   Patient Name: Norman Mitchell MRN: [id number] DOB:1940/03/03, 83 y.o., male Today's Date: 05/18/2023  PCP: Crawford Givens, MD  REFERRING PROVIDER: Kerin Salen, DO   End of Session - 05/18/23 1316     Visit Number 19    Number of Visits 24    Date for SLP Re-Evaluation 06/01/23    SLP Start Time 1220    SLP Stop Time  1315    SLP Time Calculation (min) 55 min             Past Medical History:  Diagnosis Date   Arthritis    Hands, knee   Back pain    Complication of anesthesia    Was told after 1 surgery that his HR went "really low".  Says his normal HR is 50-55.   DVT (deep venous thrombosis) (HCC) 2017   Bilateral knees.  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   Hypothyroidism    Oral cancer (HCC)    Status post radiation.   Paroxysmal atrial fibrillation (HCC)    Prostatitis    Scoliosis    Thrombocytopenia (HCC)    Venous insufficiency    legs   Past Surgical History:  Procedure Laterality Date   CATARACT EXTRACTION Bilateral    KNEE ARTHROSCOPY     KNEE ARTHROSCOPY WITH MEDIAL MENISECTOMY Right 01/09/2020   Procedure: RIGHT KNEE ARTHROSCOPY WITH MEDIAL MENISECTOMY;  Surgeon: Juanell Fairly, MD;  Location: [facility];  Service: Orthopedics;  Laterality: Right;   Oral biopsy     PILONIDAL CYST EXCISION     PROSTATE BIOPSY     Negative   SEPTOPLASTY     SHOULDER ARTHROSCOPY WITH ROTATOR CUFF REPAIR AND SUBACROMIAL DECOMPRESSION Right 07/02/2020   Procedure: RIGHT SHOULDER ARTHROSCOPY  DISTAL CLAVICLE EXCISION, BICEPS TENODESIS, SUBACROMIAL DECOMPRESSION, ANTERIOR LABRAL REPAIR. AND MINI OPEN ROTATOR CUFF REPAIR;  Surgeon: Juanell Fairly, MD;  Location: [facility];  Service: Orthopedics;  Laterality: Right;   TONSILLECTOMY     Patient Active Problem List   Diagnosis Date Noted   Change in stool 04/28/2023   Medicare annual wellness visit, subsequent 11/29/2022   Tremor 11/29/2022   GERD  (gastroesophageal reflux disease) 03/15/2022   Irregular heart rhythm 11/05/2021   White coat syndrome with high blood pressure without hypertension 11/05/2021   Paresthesia 11/10/2020   Erectile dysfunction 11/10/2020   PSA elevation 11/10/2020   Osteoarthritis of right knee 06/21/2020   Synovitis of knee 06/21/2020   Partial thickness rotator cuff tear 06/21/2020   Knee pain 11/12/2019   Actinic keratosis 11/12/2019   Scoliosis 01/09/2019   Advance care planning 12/28/2018   Healthcare maintenance 12/28/2018   Venous insufficiency    Thrombocytopenia (HCC)    Prostatitis    Paroxysmal atrial fibrillation (HCC)    Hypothyroidism    History of oropharyngeal cancer 10/03/2018   Essential hypertension 08/12/2015   Palpitations 08/12/2015    ONSET DATE: 02/12/2023 (referral date)    REFERRING DIAG: Parkinsonism  THERAPY DIAG:  Primary parkinsonism (HCC)  Hypophonia  Rationale for Evaluation and Treatment Rehabilitation  SUBJECTIVE:   SUBJECTIVE STATEMENT: Pt alert, pleasant, and cooperative Pt accompanied by: self  PERTINENT HISTORY: as above  DIAGNOSTIC FINDINGS:  From ENT note, "1. 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 He is 5 years out from treatment and is considered cured from cancer. I will follow up with him in 1 year for  routine surveillance. 2. Dysphagia - Stable. 3. Thyroid function-This is being followed by his PCP, but we offered to check this if it has not been checked recently. 4. Imaging -his PCP is followed up with a chest x-ray that was normal by his report 5. Carotid arteriosclerosis -he had a Doppler with his PCP that was normal. 6. Xerostomia-the patient has medical necessity for dry mouth lozenges and Biotene that he takes on a regular basis. 7. Hoarseness - the patient's voice comes and goes but he does a lot of throat clearing. I did recommend that he try to  minimize the throat clearing so that his voice is more stable."  PAIN:  Are you having pain? No  FALLS: Has patient fallen in last 6 months?  No  LIVING ENVIRONMENT: Lives with: lives with their spouse Lives in: House/apartment  PLOF:  Level of assistance: Independent with ADLs Employment: Retired  PATIENT GOALS    to improve communication in a variety of settings  OBJECTIVE:  TODAY'S TREATMENT:    Reviewed EMST for improve expiratory strength and endurance. Pt completed 4 sets of 5 breaths independently. Rest breaks PRN. Pt noted fatigue and will complete final set for HEP today.  Reviewed Phonation Resistance Training Exercises given results of today's laryngoscopy. Pt completed the following ex's: - Sustain the vowel /a/ with a loud, energized voice for as long as possible. X10 repetitions, 84dB - Glide from low to high on the vowel /a/ over the entire pitch range using a loud, energized voice. X10 repetitions; 83dB  - Glide from high to low on the vowel /a/ over the entire pitch range using a loud, energized voice. X10 repetitions; 80 dB - Shout functional phrases using a loud and higher-pitched voice, like calling over a fence. X2; 84dB - Using the same phrases from Step 4,  produce a strong, authoritative voice at a low pitch level. X2; 77dB  During conversation sample, pt with increased hoarseness initially which improved with cues to "crank it up" (increasing loudness and pitch). When cued, pt averaged ~78 B during 8 minute speech sample.    PATIENT EDUCATION: Education details: as above Person educated: Patient Education method: Explanation Education comprehension: verbalized understanding and needs further education    GOALS: Goals reviewed with patient? Yes  SHORT TERM GOALS: Target date: 10 sessions  The patient will demonstrate abdominal breathing patterns and steady release of breath on exhalation to optimize efficiency of voicing and decrease laryngeal  hyperfunction with rate cues.   Baseline: Goal status: MET  2.  The patient will complete HEP (Maximum duration "ah", High/Lows, and Functional Phrases) at average loudness >/= 80 dB and with loud, good quality voice with min cues  Baseline:  Goal status: MET  3.  The patient will complete Hierarchal Speech Loudness reading drills (words/phrases, sentences) at average >/= 75 dB and with loud, good quality voice with min cues.  Baseline:  Goal status: MET  4.  The patient will participate in 5-8 minutes conversation, maintaining average loudness of 75 dB and good quality voice with modified independence.  Baseline:  Goal status: IN PROGRESS  5. The patient will demonstrate understanding of vocal hygiene concepts with min A. Baseline:  Goal status: IN PROGRESS     LONG TERM GOALS: Target date: 12 weeks, 06/01/23  The patient will demonstrate abdominal breathing patterns and steady release of breath on exhalation to optimize efficiency of voicing and decrease laryngeal hyperfunction with rare cues.   Baseline: Goal status: IN PROGRESS  The patient will complete HEP (Maximum duration "ah", High/Lows, and Functional Phrases) at average loudness >/= 80 dB and with loud, good quality voice indep. Baseline:  Goal status: IN PROGRESS    The patient will participate in 15-20 minutes conversation, maintaining average loudness of 75 dB and good quality voice with modified independence.  Baseline:  Goal status: IN PROGRESS  4. Pt will report improvement in communication per PROM. Baseline: CES 19/32 Goal status: IN PROGRESS  ASSESSMENT:  CLINICAL IMPRESSION: Patient is a 83 y.o. male who was seen today for voice/motor-speech treatment in setting of Parkinsonism. 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 Pt presents with s/sx hypokinetic dysarthria c/b hypophonia, hoarse/strained, occasionally breathy  voice. Pt with tendency to speak on residual capacity and utilize thoracic and clavicular breathing patterns. Pt with reports of dissatisfaction with ability to verbally communicate in social settings or at a distance from a communication partner. See details of tx session above. Recommend course of ST with an intensity based approach as well as additional education re: vocal changes in Parkinsonism and vocal hygiene.  OBJECTIVE IMPAIRMENTS include voice disorder. These impairments are limiting patient from effectively communicating at home and in community. Factors affecting potential to achieve goals and functional outcome are co-morbidities. Patient will benefit from skilled SLP services to address above impairments and improve overall function.  REHAB POTENTIAL: Good  PLAN: SLP FREQUENCY: 1-2x/week  SLP DURATION: 12 weeks  PLANNED INTERVENTIONS: Environmental controls, Cueing hierachy, Internal/external aids, Functional tasks, Multimodal communication approach, SLP instruction and feedback, Compensatory strategies, and Patient/family education    Clyde Canterbury, M.S., CCC-SLP Speech-Language Pathologist [place] [facility] [phone] Arnette Felts)  [facility] at [facility] [address] [place], [place], [address] Phone: [phone]   Fax:  [phone]

## 2023-05-20 ENCOUNTER — Ambulatory Visit: Payer: Medicare Other

## 2023-05-20 DIAGNOSIS — R498 Other voice and resonance disorders: Secondary | ICD-10-CM

## 2023-05-20 DIAGNOSIS — G20C Parkinsonism, unspecified: Secondary | ICD-10-CM | POA: Diagnosis not present

## 2023-05-20 NOTE — Therapy (Signed)
 OUTPATIENT SPEECH LANGUAGE PATHOLOGY [facility]'S TREATMENT   Patient Name: Norman Mitchell MRN: [id number] DOB:Jul 10, 1940, 83 y.o., male Today's Date: 05/20/2023  PCP: Crawford Givens, MD  REFERRING PROVIDER: Kerin Salen, DO  Speech Therapy Progress Note  Dates of Reporting Period: 04/08/23 to 05/20/23  Objective: Patient has been seen for 10 speech therapy sessions this reporting period targeting dysphonia in setting of Parkinsonism. Patient is making progress toward LTGs and met all remaining STGs this reporting period. See skilled intervention, clinical impressions, and goals below for details.     End of Session - 05/20/23 1623     Visit Number 20    Number of Visits 24    Date for SLP Re-Evaluation 06/01/23    SLP Start Time 1445    SLP Stop Time  1530    SLP Time Calculation (min) 45 min    Activity Tolerance Patient tolerated treatment well             Past Medical History:  Diagnosis Date   Arthritis    Hands, knee   Back pain    Complication of anesthesia    Was told after 1 surgery that his HR went "really low".  Says his normal HR is 50-55.   DVT (deep venous thrombosis) (HCC) 2017   Bilateral knees.  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   Hypothyroidism    Oral cancer (HCC)    Status post radiation.   Paroxysmal atrial fibrillation (HCC)    Prostatitis    Scoliosis    Thrombocytopenia (HCC)    Venous insufficiency    legs   Past Surgical History:  Procedure Laterality Date   CATARACT EXTRACTION Bilateral    KNEE ARTHROSCOPY     KNEE ARTHROSCOPY WITH MEDIAL MENISECTOMY Right 01/09/2020   Procedure: RIGHT KNEE ARTHROSCOPY WITH MEDIAL MENISECTOMY;  Surgeon: Juanell Fairly, MD;  Location: [facility];  Service: Orthopedics;  Laterality: Right;   Oral biopsy     PILONIDAL CYST EXCISION     PROSTATE BIOPSY     Negative   SEPTOPLASTY     SHOULDER ARTHROSCOPY WITH ROTATOR CUFF REPAIR AND SUBACROMIAL DECOMPRESSION Right 07/02/2020   Procedure: RIGHT  SHOULDER ARTHROSCOPY  DISTAL CLAVICLE EXCISION, BICEPS TENODESIS, SUBACROMIAL DECOMPRESSION, ANTERIOR LABRAL REPAIR. AND MINI OPEN ROTATOR CUFF REPAIR;  Surgeon: Juanell Fairly, MD;  Location: [facility];  Service: Orthopedics;  Laterality: Right;   TONSILLECTOMY     Patient Active Problem List   Diagnosis Date Noted   Change in stool 04/28/2023   Medicare annual wellness visit, subsequent 11/29/2022   Tremor 11/29/2022   GERD (gastroesophageal reflux disease) 03/15/2022   Irregular heart rhythm 11/05/2021   White coat syndrome with high blood pressure without hypertension 11/05/2021   Paresthesia 11/10/2020   Erectile dysfunction 11/10/2020   PSA elevation 11/10/2020   Osteoarthritis of right knee 06/21/2020   Synovitis of knee 06/21/2020   Partial thickness rotator cuff tear 06/21/2020   Knee pain 11/12/2019   Actinic keratosis 11/12/2019   Scoliosis 01/09/2019   Advance care planning 12/28/2018   Healthcare maintenance 12/28/2018   Venous insufficiency    Thrombocytopenia (HCC)    Prostatitis    Paroxysmal atrial fibrillation (HCC)    Hypothyroidism    History of oropharyngeal cancer 10/03/2018   Essential hypertension 08/12/2015   Palpitations 08/12/2015    ONSET DATE: 02/12/2023 (referral date)    REFERRING DIAG: Parkinsonism  THERAPY DIAG:  Primary parkinsonism (HCC)  Hypophonia  Rationale for Evaluation and Treatment Rehabilitation  SUBJECTIVE:  SUBJECTIVE STATEMENT: Pt alert, pleasant, and cooperative Pt accompanied by: self  PERTINENT HISTORY: as above  DIAGNOSTIC FINDINGS:  From ENT note, "1. 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 He is 5 years out from treatment and is considered cured from cancer. I will follow up with him in 1 year for routine surveillance. 2. Dysphagia - Stable. 3. Thyroid function-This is being followed by his PCP, but we offered to  check this if it has not been checked recently. 4. Imaging -his PCP is followed up with a chest x-ray that was normal by his report 5. Carotid arteriosclerosis -he had a Doppler with his PCP that was normal. 6. Xerostomia-the patient has medical necessity for dry mouth lozenges and Biotene that he takes on a regular basis. 7. Hoarseness - the patient's voice comes and goes but he does a lot of throat clearing. I did recommend that he try to minimize the throat clearing so that his voice is more stable."  PAIN:  Are you having pain? No  FALLS: Has patient fallen in last 6 months?  No  LIVING ENVIRONMENT: Lives with: lives with their spouse Lives in: House/apartment  PLOF:  Level of assistance: Independent with ADLs Employment: Retired  PATIENT GOALS    to improve communication in a variety of settings  OBJECTIVE:  TODAY'S TREATMENT:    Reviewed EMST for improve expiratory strength and endurance. Pt completed 5 sets of 5 breaths independently. Rest breaks PRN.   During structured reading tasks, pt benefit from cues for easy onset of vocal loudness to improve initial hoarseness as well as cues for rest breaks. Pt continues with reduced vocal stamina which appears to improve with rest.   During conversation sample, pt with increased hoarseness initially which improved with cues to "crank it up" (increasing loudness and pitch). When cued, pt averaged ~77 dB during 8 minute speech sample.    PATIENT EDUCATION: Education details: as above Person educated: Patient Education method: Explanation Education comprehension: verbalized understanding and needs further education    GOALS: Goals reviewed with patient? Yes  SHORT TERM GOALS: Target date: 10 sessions  The patient will demonstrate abdominal breathing patterns and steady release of breath on exhalation to optimize efficiency of voicing and decrease laryngeal hyperfunction with rate cues.   Baseline: Goal status: MET  2.   The patient will complete HEP (Maximum duration "ah", High/Lows, and Functional Phrases) at average loudness >/= 80 dB and with loud, good quality voice with min cues  Baseline:  Goal status: MET  3.  The patient will complete Hierarchal Speech Loudness reading drills (words/phrases, sentences) at average >/= 75 dB and with loud, good quality voice with min cues.  Baseline:  Goal status: MET  4.  The patient will participate in 5-8 minutes conversation, maintaining average loudness of 75 dB and good quality voice with modified independence.  Baseline:  Goal status: MET  5. The patient will demonstrate understanding of vocal hygiene concepts with min A. Baseline:  Goal status: IN PROGRESS     LONG TERM GOALS: Target date: 12 weeks, 06/01/23  The patient will demonstrate abdominal breathing patterns and steady release of breath on exhalation to optimize efficiency of voicing and decrease laryngeal hyperfunction with rare cues.   Baseline: Goal status: MET  The patient will complete HEP (Maximum duration "ah", High/Lows, and Functional Phrases) at average loudness >/= 80 dB and with loud, good quality voice indep.  Baseline:  Goal status: IN PROGRESS    The patient will participate in 15-20 minutes conversation, maintaining average loudness of 75 dB and good quality voice with modified independence.  Baseline:  Goal status: IN PROGRESS  4. Pt will report improvement in communication per PROM. Baseline: CES 19/32 Goal status: IN PROGRESS  ASSESSMENT:  CLINICAL IMPRESSION: Patient is a 83 y.o. male who was seen today for voice/motor-speech treatment in setting of Parkinsonism. 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 Pt presents with s/sx hypokinetic dysarthria c/b hypophonia, hoarse/strained, occasionally breathy voice. Pt with tendency to speak on residual capacity and utilize thoracic and  clavicular breathing patterns. Pt with reports of dissatisfaction with ability to verbally communicate in social settings or at a distance from a communication partner. See details of tx session above. Recommend course of ST with an intensity based approach as well as additional education re: vocal changes in Parkinsonism and vocal hygiene.  OBJECTIVE IMPAIRMENTS include voice disorder. These impairments are limiting patient from effectively communicating at home and in community. Factors affecting potential to achieve goals and functional outcome are co-morbidities. Patient will benefit from skilled SLP services to address above impairments and improve overall function.  REHAB POTENTIAL: Good  PLAN: SLP FREQUENCY: 1-2x/week  SLP DURATION: 12 weeks  PLANNED INTERVENTIONS: Environmental controls, Cueing hierachy, Internal/external aids, Functional tasks, Multimodal communication approach, SLP instruction and feedback, Compensatory strategies, and Patient/family education    Clyde Canterbury, M.S., CCC-SLP Speech-Language Pathologist [place] [facility] [phone] Arnette Felts)  [facility] [facility] Outpatient Rehabilitation at [facility] [address] [place], [place], [address] Phone: [phone]   Fax:  [phone]

## 2023-05-24 ENCOUNTER — Ambulatory Visit (INDEPENDENT_AMBULATORY_CARE_PROVIDER_SITE_OTHER): Admitting: Family Medicine

## 2023-05-24 ENCOUNTER — Encounter: Payer: Self-pay | Admitting: Family Medicine

## 2023-05-24 ENCOUNTER — Ambulatory Visit (INDEPENDENT_AMBULATORY_CARE_PROVIDER_SITE_OTHER)
Admission: RE | Admit: 2023-05-24 | Discharge: 2023-05-24 | Disposition: A | Discharge status: Home / Self Care | Source: Ambulatory Visit | Attending: Family Medicine | Admitting: Family Medicine

## 2023-05-24 VITALS — BP 136/72 | HR 54 | Temp 98.2°F | Ht 69.0 in | Wt 179.6 lb

## 2023-05-24 DIAGNOSIS — M5432 Sciatica, left side: Secondary | ICD-10-CM | POA: Diagnosis not present

## 2023-05-24 MED ORDER — PREDNISONE 20 MG PO TABS: ORAL_TABLET | ORAL | 0 refills | Status: DC

## 2023-05-24 NOTE — Progress Notes (Unsigned)
 BMs improved in the meantime.  Prev queasy feeling resolved.  Changed to lactaid temporarily w/o relief, then back to regular milk in the meantime.   Started [employer] account [employer].   Discussed. Still in SLP, likely for a few more weeks.  His voice sounds stronger in the meantime, "more on that off."    He is getting massages for his lower back/hips.    He was asking about possible crepitus, ie popping with movement, noted in his back, midline.  This is relatively new.  Doesn't happen when taking a hot shower.  H/o scoliosis noted.  Not painful.    D/w pt about sciatica.  H/o similar in the past. H/o scoliosis.  Going on for a few weeks or a month.  L side, runs down the L leg, posterior, electrical pain.   Also with R lateral and anterior thigh pain, feels like pain from a sore muscle.  The pain is not the same in quality from leg to leg.  No pain sitting.  More pain standing.  Icing helps.  Taking 1 ibuprofen and 1 tylenol at night.  Pain sleeping on either side at night.  Still walking for exercise.  No trigger, no trauma.  No falls.  No FCNAVD.  Urine is a little slower but not far from baseline.  Not burning with urination.  Meds, vitals, and allergies reviewed.   ROS: Per HPI unless specifically indicated in ROS section   Nad Ncat Neck supple no LA Rrr Ctab Abd soft, not ttp Strength and sensation grossly intact in bilateral lower extremities with straight leg raise negative.  Able to bear weight.  Back nontender to midline.

## 2023-05-24 NOTE — Patient Instructions (Addendum)
 Xray on the way out.  Take 2 prednisone a day for 5 days, then 1 a day for 5 days, with food. Don't take with aleve/ibuprofen. Take care.  Glad to see you.

## 2023-05-25 ENCOUNTER — Ambulatory Visit: Payer: Medicare Other

## 2023-05-25 DIAGNOSIS — R471 Dysarthria and anarthria: Secondary | ICD-10-CM

## 2023-05-25 DIAGNOSIS — R498 Other voice and resonance disorders: Secondary | ICD-10-CM

## 2023-05-25 DIAGNOSIS — G20C Parkinsonism, unspecified: Secondary | ICD-10-CM | POA: Diagnosis not present

## 2023-05-25 NOTE — Therapy (Signed)
 OUTPATIENT SPEECH LANGUAGE PATHOLOGY PARKINSON'S TREATMENT   Patient Name: Norman Mitchell MRN: [id number] DOB:06-14-1940, 83 y.o., male Today's Date: 05/25/2023  PCP: Crawford Givens, MD  REFERRING PROVIDER: Kerin Salen, DO     End of Session - 05/25/23 1310     Visit Number 21    Number of Visits 24    Date for SLP Re-Evaluation 06/01/23    SLP Start Time 1313    SLP Stop Time  1400    SLP Time Calculation (min) 47 min    Activity Tolerance Patient tolerated treatment well             Past Medical History:  Diagnosis Date   Arthritis    Hands, knee   Back pain    Complication of anesthesia    Was told after 1 surgery that his HR went "really low".  Says his normal HR is 50-55.   DVT (deep venous thrombosis) (HCC) 2017   Bilateral knees.  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   Hypothyroidism    Oral cancer (HCC)    Status post radiation.   Paroxysmal atrial fibrillation (HCC)    Prostatitis    Scoliosis    Thrombocytopenia (HCC)    Venous insufficiency    legs   Past Surgical History:  Procedure Laterality Date   CATARACT EXTRACTION Bilateral    KNEE ARTHROSCOPY     KNEE ARTHROSCOPY WITH MEDIAL MENISECTOMY Right 01/09/2020   Procedure: RIGHT KNEE ARTHROSCOPY WITH MEDIAL MENISECTOMY;  Surgeon: Juanell Fairly, MD;  Location: [facility];  Service: Orthopedics;  Laterality: Right;   Oral biopsy     PILONIDAL CYST EXCISION     PROSTATE BIOPSY     Negative   SEPTOPLASTY     SHOULDER ARTHROSCOPY WITH ROTATOR CUFF REPAIR AND SUBACROMIAL DECOMPRESSION Right 07/02/2020   Procedure: RIGHT SHOULDER ARTHROSCOPY  DISTAL CLAVICLE EXCISION, BICEPS TENODESIS, SUBACROMIAL DECOMPRESSION, ANTERIOR LABRAL REPAIR. AND MINI OPEN ROTATOR CUFF REPAIR;  Surgeon: Juanell Fairly, MD;  Location: [facility];  Service: Orthopedics;  Laterality: Right;   TONSILLECTOMY     Patient Active Problem List   Diagnosis Date Noted   Change in stool 04/28/2023   Medicare annual  wellness visit, subsequent 11/29/2022   Tremor 11/29/2022   GERD (gastroesophageal reflux disease) 03/15/2022   Irregular heart rhythm 11/05/2021   White coat syndrome with high blood pressure without hypertension 11/05/2021   Paresthesia 11/10/2020   Erectile dysfunction 11/10/2020   PSA elevation 11/10/2020   Osteoarthritis of right knee 06/21/2020   Synovitis of knee 06/21/2020   Partial thickness rotator cuff tear 06/21/2020   Knee pain 11/12/2019   Actinic keratosis 11/12/2019   Scoliosis 01/09/2019   Advance care planning 12/28/2018   Healthcare maintenance 12/28/2018   Venous insufficiency    Thrombocytopenia (HCC)    Prostatitis    Paroxysmal atrial fibrillation (HCC)    Hypothyroidism    History of oropharyngeal cancer 10/03/2018   Essential hypertension 08/12/2015   Palpitations 08/12/2015    ONSET DATE: 02/12/2023 (referral date)    REFERRING DIAG: Parkinsonism  THERAPY DIAG:  Primary parkinsonism (HCC)  Dysarthria and anarthria  Hypophonia  Rationale for Evaluation and Treatment Rehabilitation  SUBJECTIVE:   SUBJECTIVE STATEMENT: Pt alert, pleasant, and cooperative Pt accompanied by: self  PERTINENT HISTORY: as above  DIAGNOSTIC FINDINGS:  From ENT note, "1. 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 He is 5 years out from treatment and  is considered cured from cancer. I will follow up with him in 1 year for routine surveillance. 2. Dysphagia - Stable. 3. Thyroid function-This is being followed by his PCP, but we offered to check this if it has not been checked recently. 4. Imaging -his PCP is followed up with a chest x-ray that was normal by his report 5. Carotid arteriosclerosis -he had a Doppler with his PCP that was normal. 6. Xerostomia-the patient has medical necessity for dry mouth lozenges and Biotene that he takes on a regular basis. 7. Hoarseness - the  patient's voice comes and goes but he does a lot of throat clearing. I did recommend that he try to minimize the throat clearing so that his voice is more stable."  PAIN:  Are you having pain? No  FALLS: Has patient fallen in last 6 months?  No  LIVING ENVIRONMENT: Lives with: lives with their spouse Lives in: House/apartment  PLOF:  Level of assistance: Independent with ADLs Employment: Retired  PATIENT GOALS    to improve communication in a variety of settings  OBJECTIVE:  TODAY'S TREATMENT:    Pt completed the following PROMs.    The Communication Effectiveness Survey is a patient-reported outcome measure in which the patient rates their own effectiveness in different communication situations. A higher score indicates greater effectiveness.   Pt's self-rating was 22/32.   Having a conversation with a family member or friends at home. 3 Participating in conversation with strangers in a quiet place. 3 Conversing with a familiar person over the telephone. 3 Conversing with a stranger over the telephone. 2 Being part of a conversation in a noisy environment (social gathering). 3 Speaking to a friend when you are emotionally upset or you are angry. 3 Having a conversation while traveling in a car. 3 Having a conversation with someone at a distance (across a room). 2    VOICE HANDICAP INDEX (VHI)  The Voice Handicap Index is comprised of a series of questions to assess the patient's perception of their voice. It is designed to evaluate the emotional, physical and functional components of the voice problem.  Functional: 17 Physical: 20 Emotional: 4 Total: 41/120 - suggesting moderate impairment  Reviewed EMST for improve expiratory strength and endurance. Pt completed 5 sets of 5 breaths independently. Rest breaks PRN.  During conversation sample, pt with continues with hoarseness; however, loudness is maintained. Pt averaged ~75 dB during 10 minute speech sample.     PATIENT EDUCATION: Education details: as above Person educated: Patient Education method: Explanation Education comprehension: verbalized understanding and needs further education    GOALS: Goals reviewed with patient? Yes  SHORT TERM GOALS: Target date: 10 sessions  The patient will demonstrate abdominal breathing patterns and steady release of breath on exhalation to optimize efficiency of voicing and decrease laryngeal hyperfunction with rate cues.   Baseline: Goal status: MET  2.  The patient will complete HEP (Maximum duration "ah", High/Lows, and Functional Phrases) at average loudness >/= 80 dB and with loud, good quality voice with min cues  Baseline:  Goal status: MET  3.  The patient will complete Hierarchal Speech Loudness reading drills (words/phrases, sentences) at average >/= 75 dB and with loud, good quality voice with min cues.  Baseline:  Goal status: MET  4.  The patient will participate in 5-8 minutes conversation, maintaining average loudness of 75 dB and good quality voice with modified independence.  Baseline:  Goal status: MET  5. The patient will demonstrate understanding of  vocal hygiene concepts with min A. Baseline:  Goal status: IN PROGRESS     LONG TERM GOALS: Target date: 12 weeks, 06/01/23  The patient will demonstrate abdominal breathing patterns and steady release of breath on exhalation to optimize efficiency of voicing and decrease laryngeal hyperfunction with rare cues.   Baseline: Goal status: MET  The patient will complete HEP (Maximum duration "ah", High/Lows, and Functional Phrases) at average loudness >/= 80 dB and with loud, good quality voice indep. Baseline:  Goal status: IN PROGRESS    The patient will participate in 15-20 minutes conversation, maintaining average loudness of 75 dB and good quality voice with modified independence.  Baseline:  Goal status: IN PROGRESS  4. Pt will report improvement in communication  per PROM. Baseline: CES 19/32 Goal status: MET; 22/32 on 05/25/23  ASSESSMENT:  CLINICAL IMPRESSION: Patient is a 83 y.o. male who was seen today for voice/motor-speech treatment in setting of Parkinsonism. 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 Pt presents with s/sx hypokinetic dysarthria c/b hypophonia, hoarse/strained, occasionally breathy voice. Pt with tendency to speak on residual capacity and utilize thoracic and clavicular breathing patterns. Pt with reports of dissatisfaction with ability to verbally communicate in social settings or at a distance from a communication partner. See details of tx session above. Recommend course of ST with an intensity based approach as well as additional education re: vocal changes in Parkinsonism and vocal hygiene.  OBJECTIVE IMPAIRMENTS include voice disorder. These impairments are limiting patient from effectively communicating at home and in community. Factors affecting potential to achieve goals and functional outcome are co-morbidities. Patient will benefit from skilled SLP services to address above impairments and improve overall function.  REHAB POTENTIAL: Good  PLAN: SLP FREQUENCY: 1-2x/week  SLP DURATION: 12 weeks  PLANNED INTERVENTIONS: Environmental controls, Cueing hierachy, Internal/external aids, Functional tasks, Multimodal communication approach, SLP instruction and feedback, Compensatory strategies, and Patient/family education    Clyde Canterbury, M.S., CCC-SLP Speech-Language Pathologist [facility] [phone] Arnette Felts)  [facility] Outpatient Rehabilitation at [facility] [address] [place], [place], [address] Phone: [phone]   Fax:  [phone]

## 2023-05-26 ENCOUNTER — Telehealth: Payer: Self-pay | Admitting: Family Medicine

## 2023-05-26 DIAGNOSIS — M5432 Sciatica, left side: Secondary | ICD-10-CM | POA: Insufficient documentation

## 2023-05-26 NOTE — Telephone Encounter (Signed)
 Please update patient.  I am awaiting the radiology over read on his back films.  He has scoliosis and significant degenerative changes in his lower back.  If he clearly improved with prednisone and is doing well then I do not think he has to do anything else differently.  If he is not improving or if his symptoms come back then I think we should set him up with the spine clinic.  Please let me know how he feels when he is done with prednisone, sooner if needed.  Thanks.

## 2023-05-26 NOTE — Assessment & Plan Note (Signed)
 He is not having significant symptoms at the time of the office visit when he is sitting down but he has been having pain.  Reasonable to check plain films.  Steroid cautions discussed with patient.  Take 2 prednisone a day for 5 days, then 1 a day for 5 days, with food. Don't take with aleve/ibuprofen.  He can update me as needed.  He does have history of scoliosis.  I do not hear crepitus at the time of exam and his back.  He could have arthritic changes or he could have muscle tightness in his back, discussed.  Okay for outpatient follow-up.

## 2023-05-27 ENCOUNTER — Ambulatory Visit: Payer: Medicare Other

## 2023-05-27 DIAGNOSIS — R471 Dysarthria and anarthria: Secondary | ICD-10-CM

## 2023-05-27 DIAGNOSIS — G20C Parkinsonism, unspecified: Secondary | ICD-10-CM

## 2023-05-27 NOTE — Therapy (Signed)
 OUTPATIENT SPEECH LANGUAGE PATHOLOGY DISCHARGE SUMMARY   Patient Name: Norman Mitchell MRN: [id number] DOB:1940/11/03, 83 y.o., male Today's Date: 05/27/2023  PCP: Crawford Givens, MD  REFERRING PROVIDER: Kerin Salen, DO     End of Session - 05/27/23 1553     Visit Number 22    Number of Visits 24    Date for SLP Re-Evaluation 06/01/23    SLP Start Time 1445    SLP Stop Time  1525    SLP Time Calculation (min) 40 min    Activity Tolerance Patient tolerated treatment well             Past Medical History:  Diagnosis Date   Arthritis    Hands, knee   Back pain    Complication of anesthesia    Was told after 1 surgery that his HR went "really low".  Says his normal HR is 50-55.   DVT (deep venous thrombosis) (HCC) 2017   Bilateral knees.  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   Hypothyroidism    Oral cancer (HCC)    Status post radiation.   Paroxysmal atrial fibrillation (HCC)    Prostatitis    Scoliosis    Thrombocytopenia (HCC)    Venous insufficiency    legs   Past Surgical History:  Procedure Laterality Date   CATARACT EXTRACTION Bilateral    KNEE ARTHROSCOPY     KNEE ARTHROSCOPY WITH MEDIAL MENISECTOMY Right 01/09/2020   Procedure: RIGHT KNEE ARTHROSCOPY WITH MEDIAL MENISECTOMY;  Surgeon: Juanell Fairly, MD;  Location: [facility];  Service: Orthopedics;  Laterality: Right;   Oral biopsy     PILONIDAL CYST EXCISION     PROSTATE BIOPSY     Negative   SEPTOPLASTY     SHOULDER ARTHROSCOPY WITH ROTATOR CUFF REPAIR AND SUBACROMIAL DECOMPRESSION Right 07/02/2020   Procedure: RIGHT SHOULDER ARTHROSCOPY  DISTAL CLAVICLE EXCISION, BICEPS TENODESIS, SUBACROMIAL DECOMPRESSION, ANTERIOR LABRAL REPAIR. AND MINI OPEN ROTATOR CUFF REPAIR;  Surgeon: Juanell Fairly, MD;  Location: [facility];  Service: Orthopedics;  Laterality: Right;   TONSILLECTOMY     Patient Active Problem List   Diagnosis Date Noted   Left sided sciatica 05/26/2023   Change in stool  04/28/2023   Medicare annual wellness visit, subsequent 11/29/2022   Tremor 11/29/2022   GERD (gastroesophageal reflux disease) 03/15/2022   Irregular heart rhythm 11/05/2021   White coat syndrome with high blood pressure without hypertension 11/05/2021   Paresthesia 11/10/2020   Erectile dysfunction 11/10/2020   PSA elevation 11/10/2020   Osteoarthritis of right knee 06/21/2020   Synovitis of knee 06/21/2020   Partial thickness rotator cuff tear 06/21/2020   Knee pain 11/12/2019   Actinic keratosis 11/12/2019   Scoliosis 01/09/2019   Advance care planning 12/28/2018   Healthcare maintenance 12/28/2018   Venous insufficiency    Thrombocytopenia (HCC)    Prostatitis    Paroxysmal atrial fibrillation (HCC)    Hypothyroidism    History of oropharyngeal cancer 10/03/2018   Essential hypertension 08/12/2015   Palpitations 08/12/2015    ONSET DATE: 02/12/2023 (referral date)    REFERRING DIAG: Parkinsonism  THERAPY DIAG:  Primary parkinsonism (HCC)  Dysarthria and anarthria  Rationale for Evaluation and Treatment Rehabilitation  SUBJECTIVE:   SUBJECTIVE STATEMENT: Pt alert, pleasant, and cooperative Pt accompanied by: self  PERTINENT HISTORY: as above  DIAGNOSTIC FINDINGS:  From ENT note, "1. 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 He is 5 years  out from treatment and is considered cured from cancer. I will follow up with him in 1 year for routine surveillance. 2. Dysphagia - Stable. 3. Thyroid function-This is being followed by his PCP, but we offered to check this if it has not been checked recently. 4. Imaging -his PCP is followed up with a chest x-ray that was normal by his report 5. Carotid arteriosclerosis -he had a Doppler with his PCP that was normal. 6. Xerostomia-the patient has medical necessity for dry mouth lozenges and Biotene that he takes on a regular basis. 7.  Hoarseness - the patient's voice comes and goes but he does a lot of throat clearing. I did recommend that he try to minimize the throat clearing so that his voice is more stable."  PAIN:  Are you having pain? No  FALLS: Has patient fallen in last 6 months?  No  LIVING ENVIRONMENT: Lives with: lives with their spouse Lives in: House/apartment  PLOF:  Level of assistance: Independent with ADLs Employment: Retired  PATIENT GOALS    to improve communication in a variety of settings  OBJECTIVE:  TODAY'S TREATMENT:    Re-evaluation completed as follows: Sustained "ah" maximum phonation time: 6s Sustained "ah" loudness average: 82 dB Average fundamental frequency during sustained "ah":170 Hz   ( 1 SD above norms of 145 Hz +/- 23 for gender)  Oral reading (passage) loudness average: 77 dB Oral reading (passage) pitch average: 174 Hz Oral reading loudness range: 64-90 dB Conversational pitch average: 175 Hz Conversational pitch range: 118-241 Hz Conversational loudness average: 81dB Conversational loudness range: 65-92 dB Voice quality: hoarse, rough, and subjectively strained **Of note, pt with marked improvement on all measures except max phonation time  Reviewed EMST for improve expiratory strength and endurance. Pt completed 5 sets of 5 breaths independently. Rest breaks PRN.   Reviewed progress to date, HEP, and SLP POC. Pt expressed gratitude for time spent in therapy with SLP.      PATIENT EDUCATION: Education details: as above Person educated: Patient Education method: Explanation Education comprehension: verbalized understanding    GOALS: Goals reviewed with patient? Yes  SHORT TERM GOALS: Target date: 10 sessions  The patient will demonstrate abdominal breathing patterns and steady release of breath on exhalation to optimize efficiency of voicing and decrease laryngeal hyperfunction with rate cues.   Baseline: Goal status: MET  2.  The patient will complete  HEP (Maximum duration "ah", High/Lows, and Functional Phrases) at average loudness >/= 80 dB and with loud, good quality voice with min cues  Baseline:  Goal status: MET  3.  The patient will complete Hierarchal Speech Loudness reading drills (words/phrases, sentences) at average >/= 75 dB and with loud, good quality voice with min cues.  Baseline:  Goal status: MET  4.  The patient will participate in 5-8 minutes conversation, maintaining average loudness of 75 dB and good quality voice with modified independence.  Baseline:  Goal status: MET  5. The patient will demonstrate understanding of vocal hygiene concepts with min A. Baseline:  Goal status: MET     LONG TERM GOALS: Target date: 12 weeks, 06/01/23  The patient will demonstrate abdominal breathing patterns and steady release of breath on exhalation to optimize efficiency of voicing and decrease laryngeal hyperfunction with rare cues.   Baseline: Goal status: MET  The patient will complete HEP (Maximum duration "ah", High/Lows, and Functional Phrases) at average loudness >/= 80 dB and with loud, good quality voice indep. Baseline:  Goal status: MET  The patient will participate in 15-20 minutes conversation, maintaining average loudness of 75 dB and good quality voice with modified independence.  Baseline:  Goal status: MET  4. Pt will report improvement in communication per PROM. Baseline: CES 19/32 Goal status: MET; 22/32 on 05/25/23  ASSESSMENT:  CLINICAL IMPRESSION: Patient is a 83 y.o. male who was seen today for voice/motor-speech treatment in setting of Parkinsonism. 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 Pt has made great progress in ST sessions. All goals have been met and pt indep in HEP.  OBJECTIVE IMPAIRMENTS include voice disorder. These impairments are limiting patient from effectively communicating at home and in  community. Factors affecting potential to achieve goals and functional outcome are co-morbidities.   REHAB POTENTIAL: Good  PLAN: D/C ST    Clyde Canterbury, M.S., CCC-SLP Speech-Language Pathologist [facility] [phone] Arnette Felts)  [facility] Outpatient Rehabilitation at [facility] [address] [place], [place], [address] Phone: [phone]   Fax:  [phone]

## 2023-05-27 NOTE — Telephone Encounter (Signed)
 Left message to return call to our office.

## 2023-05-28 NOTE — Telephone Encounter (Signed)
 Noted. Thanks.

## 2023-05-28 NOTE — Telephone Encounter (Signed)
 Called patient reviewed all information and repeated back to me. Will call if any questions.  Patient is on day 4 of prednisone. States that he is having improvement of symptoms. If improvement does not continue or symptoms change he will reach out to office for referral or office visit to go over any other options.

## 2023-06-01 ENCOUNTER — Ambulatory Visit: Payer: Medicare Other

## 2023-06-03 ENCOUNTER — Ambulatory Visit: Payer: Medicare Other

## 2023-06-06 ENCOUNTER — Encounter: Payer: Self-pay | Admitting: Family Medicine

## 2023-06-08 ENCOUNTER — Ambulatory Visit: Payer: Medicare Other

## 2023-06-10 ENCOUNTER — Ambulatory Visit (INDEPENDENT_AMBULATORY_CARE_PROVIDER_SITE_OTHER): Admitting: Family Medicine

## 2023-06-10 ENCOUNTER — Encounter: Payer: Self-pay | Admitting: Family Medicine

## 2023-06-10 ENCOUNTER — Ambulatory Visit: Payer: Medicare Other

## 2023-06-10 VITALS — BP 128/68 | HR 52 | Temp 97.8°F | Ht 69.0 in | Wt 178.4 lb

## 2023-06-10 DIAGNOSIS — M5432 Sciatica, left side: Secondary | ICD-10-CM | POA: Diagnosis not present

## 2023-06-10 NOTE — Progress Notes (Signed)
 Most recent xrays reviewed with patient.  He had improvement with prednisone.  Less pain in the day, but still some pain at night.  Icing in the day helps.  Nights are getting some better, slowly.  Taking 200mg  ibuprofen before bed and then upon waking to urinate.  He can tolerate that dose.  Today with pain upon waking but then the pain was better as the day went along.  Nsaid cautions d/w pt.  Tylenol didn't help.  He is still taking rock steady boxing.  L leg pain, down the lateral thigh to the knee.  No R sided pain.    Meds, vitals, and allergies reviewed.   ROS: Per HPI unless specifically indicated in ROS section   Nad Ncat Neck supple, no LA Rrr Ctab Abd soft, not ttp Back nontender in the midline. SLR neg B Able to bear weight bilaterally.

## 2023-06-10 NOTE — Patient Instructions (Signed)
 Try the low dose of ibuprofen and use the back exercises.  Update me as needed.  Take care.  Glad to see you.

## 2023-06-11 ENCOUNTER — Encounter: Payer: Self-pay | Admitting: Family Medicine

## 2023-06-13 NOTE — Assessment & Plan Note (Signed)
 Discussed options.  He can tolerate low-dose of ibuprofen.  Routine cautions given to patient.  Handout given to patient and discussed regarding low back exercises.  He'll try HEP for lower back exercises.  He can update me as needed.

## 2023-06-15 ENCOUNTER — Ambulatory Visit: Payer: Medicare Other

## 2023-06-17 ENCOUNTER — Ambulatory Visit: Payer: Medicare Other

## 2023-06-22 ENCOUNTER — Ambulatory Visit: Payer: Medicare Other

## 2023-06-22 ENCOUNTER — Encounter: Payer: Self-pay | Admitting: Family Medicine

## 2023-06-23 ENCOUNTER — Ambulatory Visit: Payer: Medicare Other | Admitting: Urology

## 2023-06-24 ENCOUNTER — Ambulatory Visit: Payer: Medicare Other

## 2023-06-24 ENCOUNTER — Ambulatory Visit (INDEPENDENT_AMBULATORY_CARE_PROVIDER_SITE_OTHER): Payer: Self-pay | Admitting: Urology

## 2023-06-24 VITALS — BP 153/85 | HR 58 | Ht 69.0 in | Wt 168.0 lb

## 2023-06-24 DIAGNOSIS — Z125 Encounter for screening for malignant neoplasm of prostate: Secondary | ICD-10-CM

## 2023-06-24 DIAGNOSIS — N529 Male erectile dysfunction, unspecified: Secondary | ICD-10-CM | POA: Diagnosis not present

## 2023-06-24 DIAGNOSIS — N138 Other obstructive and reflux uropathy: Secondary | ICD-10-CM

## 2023-06-24 DIAGNOSIS — N401 Enlarged prostate with lower urinary tract symptoms: Secondary | ICD-10-CM

## 2023-06-24 LAB — BLADDER SCAN AMB NON-IMAGING

## 2023-06-24 MED ORDER — ALFUZOSIN HCL ER 10 MG PO TB24: 10.0000 mg | ORAL_TABLET | Freq: Every day | ORAL | 3 refills | Status: AC

## 2023-06-24 MED ORDER — SILDENAFIL CITRATE 100 MG PO TABS: 100.0000 mg | ORAL_TABLET | Freq: Every day | ORAL | 11 refills | Status: DC | PRN

## 2023-06-24 MED ORDER — SILDENAFIL CITRATE 100 MG PO TABS: 100.0000 mg | ORAL_TABLET | Freq: Every day | ORAL | 11 refills | Status: AC | PRN

## 2023-06-24 NOTE — Progress Notes (Signed)
   06/24/2023 3:56 PM   Norman Mitchell [birth year] [id number]  Reason for visit: Follow up BPH, ED, PSA screening  HPI: 83 year old male with urinary symptoms very well-controlled on Alfuzosin .  He really denies any significant urinary complaints today.  PVR today normal at 30ml.  He had an Enterococcus UTI in May 2024 treated with antibiotics, has been taking cranberry tablets daily since that time with no further infections.  He would like to continue alfuzosin .  Previously was on finasteride, but that medication was discontinued a few years ago.  Using sildenafil  100 mg on demand for ED with good results, refilled today.  History of very mildly elevated PSA of 3-4 around 2015, prostate MRI in 2016 was negative for malignancy prostate volume was 50 g.  PCP has continued to check PSA, most recently 4.6 from September 2024 which is overall stable.  Reassurance provided that this is on the normal range for his age and I would not recommend further workup or screening.  Alfuzosin  and sildenafil  refilled RTC 1 year PVR, if doing well at that time likely can follow-up with PCP  Lawerence Pressman, MD  [facility] [address] [place], [place] [address] [phone]

## 2023-06-29 ENCOUNTER — Ambulatory Visit: Payer: Medicare Other

## 2023-06-30 ENCOUNTER — Ambulatory Visit (INDEPENDENT_AMBULATORY_CARE_PROVIDER_SITE_OTHER): Admitting: Family Medicine

## 2023-06-30 ENCOUNTER — Encounter: Payer: Self-pay | Admitting: Family Medicine

## 2023-06-30 VITALS — BP 140/80 | HR 55 | Temp 99.0°F | Ht 69.0 in | Wt 176.5 lb

## 2023-06-30 DIAGNOSIS — M79605 Pain in left leg: Secondary | ICD-10-CM | POA: Diagnosis not present

## 2023-06-30 NOTE — Patient Instructions (Addendum)
 Treadmill: Calf and posterior lower extremity rehab  Forward Walking: Go light 2 mins, easy about 2-3 mph: At Sideways Left: 2 mins, 0.6 - 0.8 mph Sideways Right: 2 mins, 0.6 - 0.8 mph Backwards, 2 mins, 1.8 - 2.2 mph Repeat, several cycles Goal is 30 minute  Start at a 3 degree incline - can slightly lower if necessary When able, increase to 3.5, then 4, then 4.5 (After you comfortably can do 30 minutes)   Seated calf raises - use a dumbbell upright and slowly increase the weight 3 sets of 20 reps - 5,8,12,15,20, etc  Voltaren 1% gel, over the counter You can apply up to 4 times a day  This can be applied to any joint: knee, wrist, fingers, elbows, shoulders, feet and ankles. Can apply to any tendon: tennis elbow, achilles, tendon, rotator cuff or any other tendon.  Minimal is absorbed in the bloodstream: ok with oral anti-inflammatory or a blood thinner.  Cost is about 9 dollars

## 2023-06-30 NOTE — Progress Notes (Signed)
 Loras Grieshop T. Arty Lantzy, MD, CAQ Sports Medicine [facility] at [facility] [address] [place], [address]  Phone: [phone]  FAX: [phone]  Marland Taraba - 83 y.o. male  MRN [id number]  Date of Birth: [birth year]  Date: 06/30/2023  PCP: Donnie Galea, MD  Referral: Donnie Galea, MD  Chief Complaint  Patient presents with   Leg Pain    Left-Outer Leg below knee-Same symptoms when he was seen on 03/25/23   Subjective:   Vergil Loatman is a 83 y.o. very pleasant male patient with Body mass index is 26.06 kg/m. who presents with the following:  Left outer leg pain in the LE.  He saw Dr. Vallarie Gauze for L low back pain on 06/10/2023.  When I last saw him in early January, I had him take a step back from his daily walks to try to calm down his lateral lower extremity.  At this point he predominately is having pain in the caudal aspect of the lower extremity more in the region of the lateral gastroc and to a lesser extent just below the fibular head.  Symptoms have never really gone away  If uses the wrong thing, comes back at an 8/10 Right now, he is completely asymptomatic at rest in the office.  Totally different from sciatica    Review of Systems is noted in the HPI, as appropriate  Objective:   BP (!) 140/80 (BP Location: Left Arm, Patient Position: Sitting, Cuff Size: Large)   Pulse (!) 55   Temp 99 F (37.2 C) (Temporal)   Ht 5\' 9"  (1.753 m)   Wt 176 lb 8 oz (80.1 kg)   SpO2 96%   BMI 26.06 kg/m   GEN: No acute distress; alert,appropriate. PULM: Breathing comfortably in no respiratory distress PSYCH: Normally interactive.   Left knee: Full extension and flexion to 120 Mild effusion He does have some mild medial lateral joint line tenderness Stable to varus and valgus stress ACL and PCL are intact No significant tenderness at the anserine bursa or at the tibial plateau Fibular head to direct palpation is not tender,  however inferior to this he does have some tenderness No significant tenderness at the medial calf, however in the proximal lateral gastroc and lesser lateral gastroc tendon he does have some tenderness to palpation  He is able to go up on his toes  Laboratory and Imaging Data:  Assessment and Plan:     ICD-10-CM   1. Left leg pain  M79.605      At this point, the pain seems to be isolated to the lateral gastroc tendon and just inferior to the fibular head.  I think that some of this could involve the common peroneal tendon, however it does seem that the caudal muscle belly and gastroc tendon is area of maximal tenderness.  I am going to having altered his walking for the next couple of weeks and do some rehab that we will hit the lateral muscle belly.  I want him to follow-up in 6 weeks to ensure that this completely resolved  Patient Instructions  Treadmill: Calf and posterior lower extremity rehab  Forward Walking: Go light 2 mins, easy about 2-3 mph: At Sideways Left: 2 mins, 0.6 - 0.8 mph Sideways Right: 2 mins, 0.6 - 0.8 mph Backwards, 2 mins, 1.8 - 2.2 mph Repeat, several cycles Goal is 30 minute  Start at a 3 degree incline - can slightly lower if necessary When able,  increase to 3.5, then 4, then 4.5 (After you comfortably can do 30 minutes)   Seated calf raises - use a dumbbell upright and slowly increase the weight 3 sets of 20 reps - 5,8,12,15,20, etc  Voltaren 1% gel, over the counter You can apply up to 4 times a day  This can be applied to any joint: knee, wrist, fingers, elbows, shoulders, feet and ankles. Can apply to any tendon: tennis elbow, achilles, tendon, rotator cuff or any other tendon.  Minimal is absorbed in the bloodstream: ok with oral anti-inflammatory or a blood thinner.  Cost is about 9 dollars    Medication Management during today's office visit: No orders of the defined types were placed in this encounter.  There are no discontinued  medications.  Orders placed today for conditions managed today: No orders of the defined types were placed in this encounter.   Disposition: No follow-ups on file.  [employer] speech-to-text software was used for transcription in this dictation.  Possible transcriptional errors can occur using Animal nutritionist.   Signed,  Ranny Bye. Lynnleigh Soden, MD   Outpatient Encounter Medications as of 06/30/2023  Medication Sig   albuterol  (VENTOLIN  HFA) 108 (90 Base) MCG/ACT inhaler INHALE ONE TO TWO PUFFS BY MOUTH EVERY 6 HOURS AS NEEDED   alfuzosin  (UROXATRAL ) 10 MG 24 hr tablet Take 1 tablet (10 mg total) by mouth daily.   aspirin  EC (ASPIRIN  LOW DOSE) 81 MG tablet TAKE 1 TABLET BY MOUTH DAILY SWALLOW WHOLE   CRANBERRY EXTRACT PO Take 500 mg by mouth daily.   fluticasone  (FLONASE ) 50 MCG/ACT nasal spray Place 2 sprays into both nostrils daily.   levothyroxine  (SYNTHROID ) 88 MCG tablet TAKE 1 TABLET BY MOUTH DAILY   Lutein 20 MG CAPS Take by mouth daily.   Multiple Vitamins-Minerals (SENIOR MULTIVITAMIN PLUS PO) Take 1 tablet by mouth daily.   NON FORMULARY Cocoavia 750 mg daily.   polyethylene glycol powder (GLYCOLAX /MIRALAX ) 17 GM/SCOOP powder Take 17 g by mouth every other day.   sildenafil  (VIAGRA ) 100 MG tablet Take 1 tablet (100 mg total) by mouth daily as needed for erectile dysfunction.   No facility-administered encounter medications on file as of 06/30/2023.

## 2023-07-01 ENCOUNTER — Ambulatory Visit: Payer: Medicare Other

## 2023-07-06 ENCOUNTER — Ambulatory Visit: Payer: Medicare Other

## 2023-07-08 ENCOUNTER — Ambulatory Visit: Payer: Medicare Other

## 2023-07-11 ENCOUNTER — Other Ambulatory Visit: Payer: Self-pay | Admitting: Family Medicine

## 2023-07-11 DIAGNOSIS — Z85819 Personal history of malignant neoplasm of unspecified site of lip, oral cavity, and pharynx: Secondary | ICD-10-CM

## 2023-07-11 NOTE — Progress Notes (Unsigned)
 Please call patient.  He is due for follow-up chest x-ray and carotid ultrasound.  He should get a call about getting the carotid ultrasound scheduled.  Please help him get scheduled for his chest x-ray here in clinic.  Thanks.

## 2023-07-13 ENCOUNTER — Ambulatory Visit: Payer: Medicare Other

## 2023-07-13 NOTE — Progress Notes (Signed)
 Patient scheduled for chest x-ray and advised that he should be expecting a call to schedule for his carotid ultyasounf

## 2023-07-15 ENCOUNTER — Other Ambulatory Visit: Payer: Self-pay

## 2023-07-15 ENCOUNTER — Ambulatory Visit

## 2023-07-15 ENCOUNTER — Ambulatory Visit (INDEPENDENT_AMBULATORY_CARE_PROVIDER_SITE_OTHER)
Admission: RE | Admit: 2023-07-15 | Discharge: 2023-07-15 | Disposition: A | Discharge status: Home / Self Care | Source: Ambulatory Visit | Attending: Family Medicine | Admitting: Family Medicine

## 2023-07-15 ENCOUNTER — Ambulatory Visit: Payer: Medicare Other

## 2023-07-15 DIAGNOSIS — Z85819 Personal history of malignant neoplasm of unspecified site of lip, oral cavity, and pharynx: Secondary | ICD-10-CM

## 2023-07-19 ENCOUNTER — Ambulatory Visit: Payer: Self-pay | Admitting: Family Medicine

## 2023-07-20 ENCOUNTER — Ambulatory Visit: Payer: Medicare Other

## 2023-07-22 ENCOUNTER — Ambulatory Visit: Payer: Medicare Other

## 2023-07-28 ENCOUNTER — Ambulatory Visit: Attending: Family Medicine

## 2023-07-28 DIAGNOSIS — Z85819 Personal history of malignant neoplasm of unspecified site of lip, oral cavity, and pharynx: Secondary | ICD-10-CM | POA: Insufficient documentation

## 2023-08-03 ENCOUNTER — Encounter: Payer: Self-pay | Admitting: Family Medicine

## 2023-08-03 ENCOUNTER — Ambulatory Visit: Attending: Family Medicine

## 2023-08-03 ENCOUNTER — Ambulatory Visit (INDEPENDENT_AMBULATORY_CARE_PROVIDER_SITE_OTHER): Admitting: Family Medicine

## 2023-08-03 VITALS — BP 136/78 | HR 51 | Temp 97.9°F | Ht 69.0 in | Wt 175.8 lb

## 2023-08-03 DIAGNOSIS — I480 Paroxysmal atrial fibrillation: Secondary | ICD-10-CM

## 2023-08-03 NOTE — Patient Instructions (Addendum)
 Go to the lab on the way out.   If you have mychart we'll likely use that to update you.    Take care.  Glad to see you. Let me know if you have trouble getting the patch set up.  See if that correlates with your watch monitor.

## 2023-08-03 NOTE — Progress Notes (Unsigned)
 Prev cards eval 12/30/22.  Had sleep study done, neg. Still aspirin  in the meantime.  Not on rate or rhythm control.    Inc AF detection on his watch while taking fish oil.  That improved off med.    Then noted in AF frequency while on prednisone .    There is the question of how accurate his phone monitoring is, d/w pt.    No CP.  Not SOB.

## 2023-08-04 ENCOUNTER — Ambulatory Visit: Payer: Self-pay | Admitting: Family Medicine

## 2023-08-04 LAB — BASIC METABOLIC PANEL WITH GFR
BUN: 27 mg/dL — ABNORMAL HIGH (ref 6–23)
CO2: 31 meq/L (ref 19–32)
Calcium: 9 mg/dL (ref 8.4–10.5)
Chloride: 102 meq/L (ref 96–112)
Creatinine, Ser: 1.21 mg/dL (ref 0.40–1.50)
GFR: 56.46 mL/min — ABNORMAL LOW (ref >60.00)
Glucose, Bld: 151 mg/dL — ABNORMAL HIGH (ref 70–99)
Potassium: 4.2 meq/L (ref 3.5–5.1)
Sodium: 139 meq/L (ref 135–145)

## 2023-08-04 LAB — CBC WITH DIFFERENTIAL/PLATELET
Basophils Absolute: 0.1 10*3/uL (ref 0.0–0.1)
Basophils Relative: 1.2 % (ref 0.0–3.0)
Eosinophils Absolute: 0.2 10*3/uL (ref 0.0–0.7)
Eosinophils Relative: 3.2 % (ref 0.0–5.0)
HCT: 44.2 % (ref 39.0–52.0)
Hemoglobin: 15 g/dL (ref 13.0–17.0)
Lymphocytes Relative: 16.6 % (ref 12.0–46.0)
Lymphs Abs: 0.9 10*3/uL (ref 0.7–4.0)
MCHC: 34 g/dL (ref 30.0–36.0)
MCV: 88.4 fl (ref 78.0–100.0)
Monocytes Absolute: 0.6 10*3/uL (ref 0.1–1.0)
Monocytes Relative: 10.1 % (ref 3.0–12.0)
Neutro Abs: 3.7 10*3/uL (ref 1.4–7.7)
Neutrophils Relative %: 68.9 % (ref 43.0–77.0)
Platelets: 116 10*3/uL — ABNORMAL LOW (ref 150.0–400.0)
RBC: 5 Mil/uL (ref 4.22–5.81)
RDW: 13.6 % (ref 11.5–15.5)
WBC: 5.4 10*3/uL (ref 4.0–10.5)

## 2023-08-04 LAB — TSH: TSH: 2.1 u[IU]/mL (ref 0.35–5.50)

## 2023-08-04 NOTE — Assessment & Plan Note (Signed)
 He has a known history of paroxysmal atrial fibrillation.  The question is how accurate his watch has been in terms of his recent symptoms.  Discussed options.  See notes on labs.  Order placed for Zio patch. He can let me know if he has trouble getting the patch set up.  We can see if that correlates with his watch monitor.  At this point okay for outpatient follow-up.  He agrees with plan.

## 2023-08-05 ENCOUNTER — Encounter: Payer: Self-pay | Admitting: Family Medicine

## 2023-08-09 NOTE — Telephone Encounter (Signed)
 FYI

## 2023-08-10 NOTE — Progress Notes (Unsigned)
     Coston Mandato T. Yael Angerer, MD, CAQ Sports Medicine [facility] at [facility] [address] [place], [address]  Phone: [phone]  FAX: [phone]  Santosh Petter - 83 y.o. male  MRN [id number]  Date of Birth: September 27, 1940  Date: 08/11/2023  PCP: Donnie Galea, MD  Referral: Donnie Galea, MD  No chief complaint on file.  Subjective:   Khalid Lacko is a 83 y.o. very pleasant male patient with There is no height or weight on file to calculate BMI. who presents with the following:  Patient presents for follow-up left-sided leg pain.  I have seen him a few times with some pain.  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  When I last saw him, it seems as if his predominant area symptoms were the lateral gastrocnemius tendon.  I had him alter his workouts and then do a protocol for gastrocnemius and lateral gastroc rehabilitation, on a treadmill protocol.    Review of Systems is noted in the HPI, as appropriate  Objective:   There were no vitals taken for this visit.  GEN: No acute distress; alert,appropriate. PULM: Breathing comfortably in no respiratory distress PSYCH: Normally interactive.   Laboratory and Imaging Data:  Assessment and Plan:   ***

## 2023-08-11 ENCOUNTER — Ambulatory Visit (INDEPENDENT_AMBULATORY_CARE_PROVIDER_SITE_OTHER): Admitting: Family Medicine

## 2023-08-11 ENCOUNTER — Encounter: Payer: Self-pay | Admitting: Family Medicine

## 2023-08-11 VITALS — BP 140/90 | HR 60 | Temp 98.0°F | Ht 69.0 in | Wt 173.5 lb

## 2023-08-11 DIAGNOSIS — M79605 Pain in left leg: Secondary | ICD-10-CM | POA: Diagnosis not present

## 2023-08-23 NOTE — Progress Notes (Unsigned)
 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  DaTscan  was reported to show evidence of symmetric decreased radiotracer in the left and right putamen.  This was mildly so.  -Skin biopsy for alpha-synuclein was negative  -Currently, there is nothing to do clinically but take a wait-and-see approach and watch for development of a clinical tauopathy state, but I don't see one today.  He has had diplopia for over a decade, so this is likely unrelated.  He would have had much more advanced disease if it was.  However, we had a long discussion about the possibilities.  He certainly has some parkinsonism.  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  He has done speech therapy with success.  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  - Long discussion today regarding possibilities for starting medication to see if that would help, but ultimately he decided to hold on that.  He is going to think about it.  -Having some restless leg symptoms, but he is not interested in medicine for that right now.  - Since today that his father had similar symptoms.  - Is active and participating in rock steady boxing.  I am proud of him for that.  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  History of paroxysmal A-fib  - Patient noted episodes while wearing Apple Watch and is currently wearing Zio patch to get a better idea on what is going on.  Subjective:   Norman Mitchell was seen today in follow up for history of parkinsonism.  Patient with his wife who supplements the history.  In the past, his DaTscan  was minimally positive and skin biopsy was negative.  We have just been following  him clinically.  Patient has been to speech therapy since our last visit and those notes have been reviewed.  It did help.  He still has speech issues but he has had radiation from throat CA.   He is doing RSB.  No falls.  His Apple Watch had noted episodes of A-fib.  Therefore, he followed up with primary care physician and Zio patch was ordered.  Patient is currently wearing that.   CURRENT MEDICATIONS:  Outpatient Encounter Medications as of 08/24/2023  Medication Sig   albuterol  (VENTOLIN  HFA) 108 (90 Base) MCG/ACT inhaler INHALE ONE TO TWO PUFFS BY MOUTH EVERY 6 HOURS AS NEEDED   alfuzosin  (UROXATRAL ) 10 MG 24 hr tablet Take 1 tablet (10 mg total) by mouth daily.   aspirin  EC (ASPIRIN  LOW DOSE) 81 MG tablet TAKE 1 TABLET BY MOUTH DAILY SWALLOW WHOLE   CRANBERRY EXTRACT PO Take 500 mg by mouth daily.   fluticasone  (FLONASE ) 50 MCG/ACT nasal spray Place 2 sprays into both nostrils daily.   levothyroxine  (SYNTHROID ) 88 MCG tablet TAKE 1 TABLET BY MOUTH DAILY   Lutein 20 MG CAPS Take by mouth daily.   Multiple Vitamins-Minerals (SENIOR MULTIVITAMIN PLUS PO) Take 1 tablet by mouth daily.   NON FORMULARY Cocoavia 750 mg daily.   polyethylene glycol powder (GLYCOLAX /MIRALAX ) 17 GM/SCOOP powder Take 17 g by mouth every other day.   sildenafil  (VIAGRA ) 100 MG tablet Take 1 tablet (  100 mg total) by mouth daily as needed for erectile dysfunction.   No facility-administered encounter medications on file as of 08/24/2023.     Objective:   PHYSICAL EXAMINATION:    VITALS:   Vitals:   08/24/23 1421  BP: (!) 162/70  Pulse: 76  SpO2: 98%  Weight: 177 lb (80.3 kg)  Height: 5' 9 (1.753 m)     GEN:  The patient appears stated age and is in NAD. HEENT:  Normocephalic, atraumatic.  The mucous membranes are moist. The superficial temporal arteries are without ropiness or tenderness. CV: Bradycardic.  Regular. Lungs:  CTAB Neck/HEME:  There are no carotid bruits bilaterally.  Neurological  examination:  Orientation: The patient is alert and oriented x3. Cranial nerves: There is good facial symmetry. there was facial hypomimia.  The speech is fluent and hypophonic. Soft palate rises symmetrically and there is no tongue deviation. Hearing is intact to conversational tone. Sensation: Sensation is intact to light touch throughout Motor: Strength is at least antigravity x4.  Movement examination: Tone: There is nl tone in the bilateral upper extremities.  The tone in the lower extremities is nl.  Abnormal movements: there is no leg tremor today Coordination:  There is no significant decremation today with any form of RAMS, including alternating supination and pronation of the forearm, hand opening and closing, finger taps, heel taps and toe taps.  Gait and Station: The patient has no  difficulty arising out of a deep-seated chair without the use of the hands. The patient's stride length is good, although he is slightly forward flexed.  Min trouble with the R leg in the turn.   Arm swing is good.      Total time spent on today's visit was 30 minutes, including both face-to-face time and nonface-to-face time.  Time included that spent on review of records (prior notes available to me/labs/imaging if pertinent), discussing treatment and goals, answering patient's questions and coordinating care.  Cc:  Cleatus Arlyss RAMAN, MD

## 2023-08-24 ENCOUNTER — Encounter: Payer: Self-pay | Admitting: Neurology

## 2023-08-24 ENCOUNTER — Ambulatory Visit (INDEPENDENT_AMBULATORY_CARE_PROVIDER_SITE_OTHER): Payer: Medicare Other | Admitting: Neurology

## 2023-08-24 VITALS — BP 162/70 | HR 76 | Ht 69.0 in | Wt 177.0 lb

## 2023-08-24 DIAGNOSIS — R498 Other voice and resonance disorders: Secondary | ICD-10-CM

## 2023-08-24 DIAGNOSIS — G20C Parkinsonism, unspecified: Secondary | ICD-10-CM

## 2023-08-24 NOTE — Patient Instructions (Signed)

## 2023-09-13 DIAGNOSIS — I480 Paroxysmal atrial fibrillation: Secondary | ICD-10-CM | POA: Diagnosis not present

## 2023-09-26 ENCOUNTER — Ambulatory Visit
Admission: EM | Admit: 2023-09-26 | Discharge: 2023-09-26 | Disposition: A | Discharge status: Home / Self Care | Attending: Nurse Practitioner | Admitting: Nurse Practitioner

## 2023-09-26 DIAGNOSIS — W57XXXA Bitten or stung by nonvenomous insect and other nonvenomous arthropods, initial encounter: Secondary | ICD-10-CM | POA: Diagnosis not present

## 2023-09-26 DIAGNOSIS — S80862A Insect bite (nonvenomous), left lower leg, initial encounter: Secondary | ICD-10-CM

## 2023-09-26 MED ORDER — DOXYCYCLINE HYCLATE 100 MG PO CAPS: 100.0000 mg | ORAL_CAPSULE | Freq: Two times a day (BID) | ORAL | 0 refills | Status: AC

## 2023-09-26 NOTE — Discharge Instructions (Addendum)
 You were seen today for evaluation following a tick bite that occurred approximately five weeks ago. At this time, you do not have symptoms commonly associated with Lyme disease such as fever, chills, fatigue, neck stiffness, joint pain, or headache. A blood test was ordered today to check for Lyme disease, and results will be available soon. You were prescribed a 7-day course of doxycycline  to treat any possible early infection or skin involvement related to the bite. Take the medication exactly as directed, with food if it causes stomach upset, and avoid sun exposure while on this medication as it may increase your sensitivity to sunlight. Monitor for any signs of Lyme disease over the next few weeks, including a spreading rash (often shaped like a bullseye), flu-like symptoms, facial weakness, or joint pain. Contact your primary care provider if you develop any of these symptoms, or if you have questions about your lab results. Seek emergency care if you experience severe headache, neck stiffness, confusion, chest pain, difficulty breathing, or sudden weakness.

## 2023-09-26 NOTE — ED Triage Notes (Signed)
 Patient to Urgent Care with complaints of a tick bite to his left lower leg.  Symptoms x3 weeks (June 18th). Tick was removed at other urgent care- treated with 2 tablets of doxy. Reports developing a circular rash to the area (Tuesday) and is concerned about possible lyme disease. Denies any symptoms other than feeling shaky.

## 2023-09-26 NOTE — ED Provider Notes (Signed)
 Norman Mitchell    CSN: [id number] Arrival date & time: 09/26/23  1537      History   Chief Complaint Chief Complaint  Patient presents with   Insect Bite    HPI Norman Mitchell is a 83 y.o. male.   Discussed the use of AI scribe software for clinical note transcription with the patient, who gave verbal consent to proceed.   The patient presents following a tick bite on June 18th, 2025, with concerns about a new leg lesion that developed last Tuesday. The patient received two doxycycline  pills immediately after having the tick removed on 08/18/23 at an urgent care in [place] .  The patient denies experiencing fevers, chills, body aches, neck pain, stiffness, extreme fatigue, and headaches since the tick bite. Regarding the leg lesion, it initially appeared as a red area that stung for a couple of days. The redness around the lesion has persisted, and the patient describes it as having kind of a red ring around it that appears to radiate more when light hits it. The lesion is reportedly mildly painful to touch and has developed red scabs since its onset. The patient denies any drainage from the site.  The following portions of the patient's history were reviewed and updated as appropriate: allergies, current medications, past family history, past medical history, past social history, past surgical history, and problem list.    Past Medical History:  Diagnosis Date   Arthritis    Hands, knee   Back pain    Complication of anesthesia    Was told after 1 surgery that his HR went really low.  Says his normal HR is 50-55.   DVT (deep venous thrombosis) (HCC) 2017   Bilateral knees.  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   Hypothyroidism    Oral cancer (HCC)    Status post radiation.   Paroxysmal atrial fibrillation (HCC)    Prostatitis    Scoliosis    Thrombocytopenia (HCC)    Venous insufficiency    legs    Patient Active Problem List   Diagnosis Date Noted   Left sided  sciatica 05/26/2023   Change in stool 04/28/2023   Medicare annual wellness visit, subsequent 11/29/2022   Tremor 11/29/2022   GERD (gastroesophageal reflux disease) 03/15/2022   Irregular heart rhythm 11/05/2021   White coat syndrome with high blood pressure without hypertension 11/05/2021   Paresthesia 11/10/2020   Erectile dysfunction 11/10/2020   PSA elevation 11/10/2020   Osteoarthritis of right knee 06/21/2020   Synovitis of knee 06/21/2020   Partial thickness rotator cuff tear 06/21/2020   Knee pain 11/12/2019   Actinic keratosis 11/12/2019   Scoliosis 01/09/2019   Advance care planning 12/28/2018   Healthcare maintenance 12/28/2018   Venous insufficiency    Thrombocytopenia (HCC)    Prostatitis    Paroxysmal atrial fibrillation (HCC)    Hypothyroidism    History of oropharyngeal cancer 10/03/2018   Essential hypertension 08/12/2015   Palpitations 08/12/2015    Past Surgical History:  Procedure Laterality Date   CATARACT EXTRACTION Bilateral    KNEE ARTHROSCOPY     KNEE ARTHROSCOPY WITH MEDIAL MENISECTOMY Right 01/09/2020   Procedure: RIGHT KNEE ARTHROSCOPY WITH MEDIAL MENISECTOMY;  Surgeon: Marchia Drivers, MD;  Location: [facility];  Service: Orthopedics;  Laterality: Right;   Oral biopsy     PILONIDAL CYST EXCISION     PROSTATE BIOPSY     Negative   SEPTOPLASTY     SHOULDER ARTHROSCOPY WITH ROTATOR CUFF REPAIR AND  SUBACROMIAL DECOMPRESSION Right 07/02/2020   Procedure: RIGHT SHOULDER ARTHROSCOPY  DISTAL CLAVICLE EXCISION, BICEPS TENODESIS, SUBACROMIAL DECOMPRESSION, ANTERIOR LABRAL REPAIR. AND MINI OPEN ROTATOR CUFF REPAIR;  Surgeon: Krasinski, Kevin, MD;  Location: [facility];  Service: Orthopedics;  Laterality: Right;   TONSILLECTOMY         Home Medications    Prior to Admission medications   Medication Sig Start Date End Date Taking? Authorizing Provider  doxycycline  (VIBRAMYCIN ) 100 MG capsule Take 1 capsule (100 mg total) by mouth  2 (two) times daily for 7 days. 09/26/23 10/03/23 Yes Iola Lukes, FNP  albuterol  (VENTOLIN  HFA) 108 (90 Base) MCG/ACT inhaler INHALE ONE TO TWO PUFFS BY MOUTH EVERY 6 HOURS AS NEEDED 05/27/22   Cleatus Arlyss RAMAN, MD  alfuzosin  (UROXATRAL ) 10 MG 24 hr tablet Take 1 tablet (10 mg total) by mouth daily. 06/24/23   Francisca Redell BROCKS, MD  aspirin  EC (ASPIRIN  LOW DOSE) 81 MG tablet TAKE 1 TABLET BY MOUTH DAILY SWALLOW WHOLE 10/06/22   Cindie Ole DASEN, MD  CRANBERRY EXTRACT PO Take 500 mg by mouth daily.    [provider]  fluticasone  (FLONASE ) 50 MCG/ACT nasal spray Place 2 sprays into both nostrils daily. 07/23/20   Cleatus Arlyss RAMAN, MD  levothyroxine  (SYNTHROID ) 88 MCG tablet TAKE 1 TABLET BY MOUTH DAILY 10/05/22   Cleatus Arlyss RAMAN, MD  Lutein 20 MG CAPS Take by mouth daily. 08/01/15   [provider]  Multiple Vitamins-Minerals (SENIOR MULTIVITAMIN PLUS PO) Take 1 tablet by mouth daily.    [provider]  NON FORMULARY Cocoavia 750 mg daily.    [provider]  polyethylene glycol powder (GLYCOLAX /MIRALAX ) 17 GM/SCOOP powder Take 17 g by mouth every other day. 03/12/22   Cleatus Arlyss RAMAN, MD  sildenafil  (VIAGRA ) 100 MG tablet Take 1 tablet (100 mg total) by mouth daily as needed for erectile dysfunction. 06/24/23   Francisca Redell BROCKS, MD    Family History Family History  Problem Relation Age of Onset   Heart disease Mother    Varicose Veins Mother    High Cholesterol Father    High blood pressure Father    Stroke Father    Hearing loss Father    Parkinson's disease Father    Parkinsonism Father    Atrial fibrillation Sister    Atrial fibrillation Sister    Heart attack Brother    Prostate cancer Paternal Grandfather        Possible diagnosis, not definite.   Colon cancer Neg Hx     Social History Social History   Tobacco Use   Smoking status: Never    Passive exposure: Never   Smokeless tobacco: Never  Vaping Use   Vaping status: Never Used   Substance Use Topics   Alcohol use: Never   Drug use: Never     Allergies   Bactrim [sulfamethoxazole-trimethoprim], Ciprofloxacin, Fish oil, Ibuprofen, and Levofloxacin   Review of Systems Review of Systems  Constitutional:  Negative for fatigue and fever.  Musculoskeletal:  Negative for myalgias and neck pain.  Skin:  Positive for wound.  Neurological:  Negative for dizziness, weakness and headaches.  All other systems reviewed and are negative.    Physical Exam Triage Vital Signs ED Triage Vitals  Encounter Vitals Group     BP      Girls Systolic BP Percentile      Girls Diastolic BP Percentile      Boys Systolic BP Percentile      Boys  Diastolic BP Percentile      Pulse      Resp      Temp      Temp src      SpO2      Weight      Height      Head Circumference      Peak Flow      Pain Score      Pain Loc      Pain Education      Exclude from Growth Chart    No data found.  Updated Vital Signs BP (!) 147/89   Pulse 72   Temp 98.7 F (37.1 C)   Resp 18   SpO2 98%   Visual Acuity Right Eye Distance:   Left Eye Distance:   Bilateral Distance:    Right Eye Near:   Left Eye Near:    Bilateral Near:     Physical Exam Vitals reviewed.  Constitutional:      General: He is awake. He is not in acute distress.    Appearance: Normal appearance. He is well-developed. He is not ill-appearing, toxic-appearing or diaphoretic.  HENT:     Head: Normocephalic.     Right Ear: Hearing normal.     Left Ear: Hearing normal.     Nose: Nose normal.     Mouth/Throat:     Mouth: Mucous membranes are moist.  Eyes:     General: Vision grossly intact.     Conjunctiva/sclera: Conjunctivae normal.  Cardiovascular:     Rate and Rhythm: Normal rate and regular rhythm.     Heart sounds: Normal heart sounds.  Pulmonary:     Effort: Pulmonary effort is normal.     Breath sounds: Normal breath sounds and air entry.  Musculoskeletal:        General: Normal range of  motion.     Cervical back: Full passive range of motion without pain, normal range of motion and neck supple.  Skin:    General: Skin is warm and dry.     Findings: Wound present.     Comments: A circular scabbed lesion on the posterior aspect of the left lower leg, with mild surrounding erythema. There is no associated swelling, increased warmth, or drainage. No fluctuance. (see picture below)   Neurological:     General: No focal deficit present.     Mental Status: He is alert and oriented to person, place, and time.  Psychiatric:        Speech: Speech normal.        Behavior: Behavior is cooperative.      UC Treatments / Results  Labs (all labs ordered are listed, but only abnormal results are displayed) Labs Reviewed  LYME DISEASE SEROLOGY W/REFLEX    EKG   Radiology No results found.  Procedures Procedures (including critical care time)  Medications Ordered in UC Medications - No data to display  Initial Impression / Assessment and Plan / UC Course  I have reviewed the triage vital signs and the nursing notes.  Pertinent labs & imaging results that were available during my care of the patient were reviewed by me and considered in my medical decision making (see chart for details).     Patient presents for evaluation of a tick bite sustained on June 18th, approximately five weeks ago. He was given two doses of doxycycline  immediately after the bite. He currently denies symptoms associated with Lyme disease, including fever, chills, body aches, neck stiffness, headache, or  fatigue. A Lyme disease blood test was ordered, which is appropriately timed for accurate detection following exposure. Given the history and potential for early localized infection or cutaneous involvement, a 7-day course of doxycycline  was prescribed. Patient was advised to monitor for any delayed symptoms of Lyme disease such as rash, joint pain, facial droop, or flu-like illness, and to follow up  with primary care if such symptoms develop or test results return positive. Seek emergency care for severe headache, neurologic symptoms, or systemic illness.  Today's evaluation has revealed no signs of a dangerous process. Discussed diagnosis with patient and/or guardian. Patient and/or guardian aware of their diagnosis, possible red flag symptoms to watch out for and need for close follow up. Patient and/or guardian understands verbal and written discharge instructions. Patient and/or guardian comfortable with plan and disposition.  Patient and/or guardian has a clear mental status at this time, good insight into illness (after discussion and teaching) and has clear judgment to make decisions regarding their care  Documentation was completed with the aid of voice recognition software. Transcription may contain typographical errors. Final Clinical Impressions(s) / UC Diagnoses   Final diagnoses:  Tick bite of left lower leg, initial encounter     Discharge Instructions      You were seen today for evaluation following a tick bite that occurred approximately five weeks ago. At this time, you do not have symptoms commonly associated with Lyme disease such as fever, chills, fatigue, neck stiffness, joint pain, or headache. A blood test was ordered today to check for Lyme disease, and results will be available soon. You were prescribed a 7-day course of doxycycline  to treat any possible early infection or skin involvement related to the bite. Take the medication exactly as directed, with food if it causes stomach upset, and avoid sun exposure while on this medication as it may increase your sensitivity to sunlight. Monitor for any signs of Lyme disease over the next few weeks, including a spreading rash (often shaped like a bullseye), flu-like symptoms, facial weakness, or joint pain. Contact your primary care provider if you develop any of these symptoms, or if you have questions about your lab  results. Seek emergency care if you experience severe headache, neck stiffness, confusion, chest pain, difficulty breathing, or sudden weakness.      ED Prescriptions     Medication Sig Dispense Auth. Provider   doxycycline  (VIBRAMYCIN ) 100 MG capsule Take 1 capsule (100 mg total) by mouth 2 (two) times daily for 7 days. 14 capsule Iola Lukes, FNP      PDMP not reviewed this encounter.   Iola Lukes, [place] 09/26/23 [phone]

## 2023-09-27 ENCOUNTER — Encounter: Payer: Self-pay | Admitting: Neurology

## 2023-09-27 LAB — LYME DISEASE SEROLOGY W/REFLEX: Lyme Total Antibody EIA: NEGATIVE

## 2023-09-28 ENCOUNTER — Other Ambulatory Visit: Payer: Self-pay

## 2023-09-28 DIAGNOSIS — R498 Other voice and resonance disorders: Secondary | ICD-10-CM

## 2023-10-06 ENCOUNTER — Ambulatory Visit: Attending: Neurology

## 2023-10-06 DIAGNOSIS — R490 Dysphonia: Secondary | ICD-10-CM | POA: Insufficient documentation

## 2023-10-06 DIAGNOSIS — R498 Other voice and resonance disorders: Secondary | ICD-10-CM | POA: Diagnosis present

## 2023-10-06 NOTE — Therapy (Signed)
 OUTPATIENT SPEECH LANGUAGE PATHOLOGY VOICE EVALUATION   Patient Name: Norman Mitchell MRN: [id number] DOB:1940/08/13, 83 y.o., male Today's Date: 10/06/2023  PCP: Arlyss Solian, MD  REFERRING PROVIDER: Asberry Schneider, DO   End of Session - 10/06/23 1354     Visit Number 1    Number of Visits 24    Date for SLP Re-Evaluation 12/29/23    SLP Start Time 1355    SLP Stop Time  1450    SLP Time Calculation (min) 55 min    Activity Tolerance Patient tolerated treatment well          Past Medical History:  Diagnosis Date   Arthritis    Hands, knee   Back pain    Complication of anesthesia    Was told after 1 surgery that his HR went really low.  Says his normal HR is 50-55.   DVT (deep venous thrombosis) (HCC) 2017   Bilateral knees.  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   Hypothyroidism    Oral cancer (HCC)    Status post radiation.   Paroxysmal atrial fibrillation (HCC)    Prostatitis    Scoliosis    Thrombocytopenia (HCC)    Venous insufficiency    legs   Past Surgical History:  Procedure Laterality Date   CATARACT EXTRACTION Bilateral    KNEE ARTHROSCOPY     KNEE ARTHROSCOPY WITH MEDIAL MENISECTOMY Right 01/09/2020   Procedure: RIGHT KNEE ARTHROSCOPY WITH MEDIAL MENISECTOMY;  Surgeon: Marchia Drivers, MD;  Location: [facility];  Service: Orthopedics;  Laterality: Right;   Oral biopsy     PILONIDAL CYST EXCISION     PROSTATE BIOPSY     Negative   SEPTOPLASTY     SHOULDER ARTHROSCOPY WITH ROTATOR CUFF REPAIR AND SUBACROMIAL DECOMPRESSION Right 07/02/2020   Procedure: RIGHT SHOULDER ARTHROSCOPY  DISTAL CLAVICLE EXCISION, BICEPS TENODESIS, SUBACROMIAL DECOMPRESSION, ANTERIOR LABRAL REPAIR. AND MINI OPEN ROTATOR CUFF REPAIR;  Surgeon: Krasinski, Kevin, MD;  Location: [facility];  Service: Orthopedics;  Laterality: Right;   TONSILLECTOMY     Patient Active Problem List   Diagnosis Date Noted   Left sided sciatica 05/26/2023   Change in stool 04/28/2023    Medicare annual wellness visit, subsequent 11/29/2022   Tremor 11/29/2022   GERD (gastroesophageal reflux disease) 03/15/2022   Irregular heart rhythm 11/05/2021   White coat syndrome with high blood pressure without hypertension 11/05/2021   Paresthesia 11/10/2020   Erectile dysfunction 11/10/2020   PSA elevation 11/10/2020   Osteoarthritis of right knee 06/21/2020   Synovitis of knee 06/21/2020   Partial thickness rotator cuff tear 06/21/2020   Knee pain 11/12/2019   Actinic keratosis 11/12/2019   Scoliosis 01/09/2019   Advance care planning 12/28/2018   Healthcare maintenance 12/28/2018   Venous insufficiency    Thrombocytopenia (HCC)    Prostatitis    Paroxysmal atrial fibrillation (HCC)    Hypothyroidism    History of oropharyngeal cancer 10/03/2018   Essential hypertension 08/12/2015   Palpitations 08/12/2015    ONSET DATE: 09/28/23 (referral date)    REFERRING DIAG: hypophonia  THERAPY DIAG:  Dysphonia  Hypophonia  Rationale for Evaluation and Treatment Rehabilitation  SUBJECTIVE:   SUBJECTIVE STATEMENT: Pt alert, pleasant, and cooperative Pt accompanied by: self  PERTINENT HISTORY & DIAGNOSTIC FINDINGS:  Pt is an 83 y.o. 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 [facility] , 01/12/23, Bilateral mild decreased putamen activity is a pattern suggestive of  Parkinsonian syndrome pathology. Larygoscopy, 05/13/23, Given vocal cords moving  but with bowing seen by laryngoscopy, and I suspect it is contributing to the hoarseness and breathiness, I let him know that we could refer him to a laryngologist, he would like to complete speech therapy first to see if laryngology would be necessary. Pt had course of ST 04/13/23-05/27/23 with improved vocal quality.   PAIN:  Are you having pain? No  FALLS: Has patient fallen in last 6 months?  No  LIVING ENVIRONMENT: Lives with: lives with their spouse Lives in: House/apartment  PLOF:  Level  of assistance: Independent with ADLs Employment: Retired  PATIENT GOALS    to improve communication in a variety of settings  OBJECTIVE:  COGNITIVE COMMUNICATION Appeared WFL   MOTOR SPEECH: Overall motor speech: impaired Level of impairment: Word Respiration: thoracic breathing, clavicular breathing, and speaking on residual capacity Phonation: breathy, hoarse, and low vocal intensity; periods aphonia Resonance: WFL Articulation: Appears intact Intelligibility: Intelligibility reduced due to dysphonia Motor planning: Appears intact Effective technique: increased vocal intensity  ORAL MOTOR EXAMINATION WFL except occasional lip and jaw tremor  SOCIAL HISTORY: Occupation: retired Counsellor intake: optimal Caffeine/alcohol intake: none Daily voice use: minimal Environmental risks: none Misuse: speaking on residual capacity Phonotraumatic behaviors: throat clearing; however, pt well versed  in gentle throat clear/silent cough  OBJECTIVE VOICE ASSESSMENT: Sustained ah maximum phonation time:  ~6 seconds Sustained ah loudness average: 66 dB Average fundamental frequency during sustained ah:148 Hz   (WFL based on norms of 145 Hz +/- 23 for gender)  Oral reading (passage) loudness average: 79 dB Oral reading (passage) pitch average: 140Hz  Oral reading loudness range: 68-88 dB Conversational pitch average: 122 Hz Conversational pitch range: 101-233 Hz Conversational loudness average: 78dB Conversational loudness range: 61-88 dB Voice quality: hoarse, breathy, rough, strained, and low vocal intensity Stimulability trials: Given SLP modeling and usual mod cues, pt subjectively report improvement in vocal quality using clear speech at the phrase and sentence level; pt also endorsed an improvement in breathiness while completing pushing/pulling ex's   PATIENT REPORTED OUTCOME MEASURES (PROM):  VOICE HANDICAP INDEX (VHI)  The Voice Handicap Index is comprised of a series of  questions to assess the patient's perception of their voice. It is designed to evaluate the emotional, physical and functional components of the voice problem.  Functional: 18/40 Physical: 19/40 Emotional: 3/40 Total: 40/120 Moderate   TODAY'S TREATMENT:  Introduced pushing/pulling ex's to help facilitate vocal cord adduction given bowing observed on laryngoscopy. Pt completed with min cueing after initial SLP demo. Handout provided for pt to continue for HEP.  Pt completed EMST at previously established threshold with rest breaks (5 sets of 5 reps).  Pt to continue for HEP.  Education provided re: results of assessment, rationale for vocal cord adduction exercises, plan for intensity based treatment, and SLP POC.    PATIENT EDUCATION: Education details: as above Person educated: Patient Education method: Explanation Education comprehension: verbalized understanding and needs further education       HOME EXERCISE PROGRAM:  Pushing/pulling ex's  EMST   GOALS: Goals reviewed with patient? Yes  SHORT TERM GOALS: Target date: 10 sessions  Pt will complete pushing/pulling exercises to improve vocal cord adduction x10 reps with no more than min cueing. Baseline: Goal status: INITIAL  2.  The patient will complete HEP (Maximum duration ah, High/Lows, and Functional Phrases) at average loudness >/= 80 dB and with loud, good quality voice without reports of strain. Baseline:  Goal status: INITIAL  3.  The patient will complete Hierarchical Speech Loudness reading  drills (words/phrases, sentences) at average >/= 75 dB and with loud, good quality voice without reports of strain. Baseline:  Goal status: INITIAL  4.  The patient will participate in 5-8 minutes conversation, maintaining average loudness of 75 dB and good quality voice with modified independence.  Baseline:  Goal status: INITIAL  5. Pt will report completing EMST for improve breath support 5x/week (may be completed  via combination of at home and in tx sessions).  Baseline:  Goal Status: INITIAL      LONG TERM GOALS: Target date: 12 weeks   The patient will complete HEP (Maximum duration ah, High/Lows, and Functional Phrases) at average loudness >/= 80 dB and with loud, good quality voice indep without reports of strain. Baseline:  Goal status: INITIAL    The patient will participate in 15-20 minutes conversation, maintaining average loudness of 75 dB and good quality voice with modified independence.  Baseline:  Goal status: INITIAL  4. Pt will report improvement in communication per PROM. Baseline:  Goal status: INITIAL  ASSESSMENT:  CLINICAL IMPRESSION: Patient is a 83 y.o. 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 Pt known well to SLP services and received ST 04/13/23-05/27/23. 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 Pt reports periods of aphonia and general decline in voice since ending ST sessions. Pt presents with s/sx dysphonia and hypophonia. Voice is c/b auditory perceptual characteristics of hoarse/strained, occasionally breathy voice, and periods of aphonia. Pt with tendency to speak on residual capacity and utilize thoracic and clavicular breathing patterns. Pt stimulable to clear speech techniques (e.g. speak loud and slow) as well as pushing/pulling to improve vocal cord adduction. Pt eager to resume ST sessions to improve communication at both home and in the community. Recommend course of ST with an intensity based approach.  OBJECTIVE IMPAIRMENTS include voice disorder. These impairments are limiting patient from effectively communicating at home and in community. Factors affecting potential to achieve goals and functional outcome are co-morbidities. Patient will benefit from skilled SLP services to address above  impairments and improve overall function.  REHAB POTENTIAL: Good  PLAN: SLP FREQUENCY: 1-2x/week  SLP DURATION: 12 weeks  PLANNED INTERVENTIONS: Environmental controls, Cueing hierachy, Internal/external aids, Functional tasks, Multimodal communication approach, SLP instruction and feedback, Compensatory strategies, and Patient/family education    Delon Bangs, M.S., CCC-SLP Speech-Language Pathologist [place] [facility] [phone] [place])  [facility] [address] [place], [place], [address] Phone: [phone]   Fax:  [phone]

## 2023-10-12 ENCOUNTER — Ambulatory Visit

## 2023-10-12 DIAGNOSIS — R498 Other voice and resonance disorders: Secondary | ICD-10-CM

## 2023-10-12 DIAGNOSIS — R490 Dysphonia: Secondary | ICD-10-CM | POA: Diagnosis not present

## 2023-10-12 NOTE — Therapy (Signed)
 OUTPATIENT SPEECH LANGUAGE PATHOLOGY VOICE TREATMENT   Patient Name: Norman Mitchell MRN: [id number] DOB:[birth year], 83 y.o., male Today's Date: 10/12/2023  PCP: Arlyss Solian, MD  REFERRING PROVIDER: Asberry Schneider, DO   End of Session - 10/12/23 1350     Visit Number 2    Number of Visits 24    Date for SLP Re-Evaluation 12/29/23    SLP Start Time 1355    SLP Stop Time  1445    SLP Time Calculation (min) 50 min    Activity Tolerance Patient tolerated treatment well          Past Medical History:  Diagnosis Date   Arthritis    Hands, knee   Back pain    Complication of anesthesia    Was told after 1 surgery that his HR went really low.  Says his normal HR is 50-55.   DVT (deep venous thrombosis) (HCC) 2017   Bilateral knees.  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   Hypothyroidism    Oral cancer (HCC)    Status post radiation.   Paroxysmal atrial fibrillation (HCC)    Prostatitis    Scoliosis    Thrombocytopenia (HCC)    Venous insufficiency    legs   Past Surgical History:  Procedure Laterality Date   CATARACT EXTRACTION Bilateral    KNEE ARTHROSCOPY     KNEE ARTHROSCOPY WITH MEDIAL MENISECTOMY Right 01/09/2020   Procedure: RIGHT KNEE ARTHROSCOPY WITH MEDIAL MENISECTOMY;  Surgeon: Marchia Drivers, MD;  Location: [facility];  Service: Orthopedics;  Laterality: Right;   Oral biopsy     PILONIDAL CYST EXCISION     PROSTATE BIOPSY     Negative   SEPTOPLASTY     SHOULDER ARTHROSCOPY WITH ROTATOR CUFF REPAIR AND SUBACROMIAL DECOMPRESSION Right 07/02/2020   Procedure: RIGHT SHOULDER ARTHROSCOPY  DISTAL CLAVICLE EXCISION, BICEPS TENODESIS, SUBACROMIAL DECOMPRESSION, ANTERIOR LABRAL REPAIR. AND MINI OPEN ROTATOR CUFF REPAIR;  Surgeon: Krasinski, Kevin, MD;  Location: [facility];  Service: Orthopedics;  Laterality: Right;   TONSILLECTOMY     Patient Active Problem List   Diagnosis Date Noted   Left sided sciatica 05/26/2023   Change in stool 04/28/2023    Medicare annual wellness visit, subsequent 11/29/2022   Tremor 11/29/2022   GERD (gastroesophageal reflux disease) 03/15/2022   Irregular heart rhythm 11/05/2021   White coat syndrome with high blood pressure without hypertension 11/05/2021   Paresthesia 11/10/2020   Erectile dysfunction 11/10/2020   PSA elevation 11/10/2020   Osteoarthritis of right knee 06/21/2020   Synovitis of knee 06/21/2020   Partial thickness rotator cuff tear 06/21/2020   Knee pain 11/12/2019   Actinic keratosis 11/12/2019   Scoliosis 01/09/2019   Advance care planning 12/28/2018   Healthcare maintenance 12/28/2018   Venous insufficiency    Thrombocytopenia (HCC)    Prostatitis    Paroxysmal atrial fibrillation (HCC)    Hypothyroidism    History of oropharyngeal cancer 10/03/2018   Essential hypertension 08/12/2015   Palpitations 08/12/2015    ONSET DATE: 09/28/23 (referral date)    REFERRING DIAG: hypophonia  THERAPY DIAG:  Dysphonia  Hypophonia  Rationale for Evaluation and Treatment Rehabilitation  SUBJECTIVE:   SUBJECTIVE STATEMENT: Pt alert, pleasant, and cooperative Pt accompanied by: self  PERTINENT HISTORY & DIAGNOSTIC FINDINGS:  Pt is an 83 y.o. 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 DaTSCAN , 01/12/23, Bilateral mild decreased putamen activity is a pattern suggestive of  Parkinsonian syndrome pathology. Larygoscopy, 05/13/23, Given vocal cords moving  but with bowing seen by laryngoscopy, and I suspect it is contributing to the hoarseness and breathiness, I let him know that we could refer him to a laryngologist, he would like to complete speech therapy first to see if laryngology would be necessary. Pt had course of ST 04/13/23-05/27/23 with improved vocal quality.   PAIN:  Are you having pain? No  FALLS: Has patient fallen in last 6 months?  No  LIVING ENVIRONMENT: Lives with: lives with their spouse Lives in: House/apartment  PLOF:  Level  of assistance: Independent with ADLs Employment: Retired  PATIENT GOALS    to improve communication in a variety of settings  OBJECTIVE:    TODAY'S TREATMENT:  Reviewed pushing/pulling ex's to help facilitate vocal cord adduction given bowing observed on laryngoscopy. Pt completed with min cueing after initial SLP demo. Pt endorsed completing for HEP. Pt to continue.   Pt endorsed completing EMST, 5 sets of 5 breaths daily. Pt to continue for HEP for 5x/week.  Introduced Hospital doctor. Pt educated re: rationale and how to perform ex's.   Pt completed the following at >80 dB: Sustained phonation - eee or hee - x10 reps Ascending Pitch Glides - hee - x5 reps Descending Pitch Glides - hee - x5 reps - Functional Phrases Voice of authority - x10 phrases, x2 reps Voice of alarm - x10 phrases, x2 reps    Conversation: pt required min cues to maintain loud, clear voice during informal conversational exchanges   PATIENT EDUCATION: Education details: as above Person educated: Patient Education method: Explanation Education comprehension: verbalized understanding and needs further education       HOME EXERCISE PROGRAM:  Pushing/pulling ex's  EMST  Phorte ex's   GOALS: Goals reviewed with patient? Yes  SHORT TERM GOALS: Target date: 10 sessions  Pt will complete pushing/pulling exercises to improve vocal cord adduction x10 reps with no more than min cueing. Baseline: Goal status: INITIAL  2.  The patient will complete HEP (Maximum duration ah, High/Lows, and Functional Phrases) at average loudness >/= 80 dB and with loud, good quality voice without reports of strain. Baseline:  Goal status: INITIAL  3.  The patient will complete Hierarchical Speech Loudness reading drills (words/phrases, sentences) at average >/= 75 dB and with loud, good quality voice without reports of strain. Baseline:  Goal status: INITIAL  4.  The patient will  participate in 5-8 minutes conversation, maintaining average loudness of 75 dB and good quality voice with modified independence.  Baseline:  Goal status: INITIAL  5. Pt will report completing EMST for improve breath support 5x/week (may be completed via combination of at home and in tx sessions).  Baseline:  Goal Status: INITIAL      LONG TERM GOALS: Target date: 12 weeks   The patient will complete HEP (Maximum duration ah, High/Lows, and Functional Phrases) at average loudness >/= 80 dB and with loud, good quality voice indep without reports of strain. Baseline:  Goal status: INITIAL    The patient will participate in 15-20 minutes conversation, maintaining average loudness of 75 dB and good quality voice with modified independence.  Baseline:  Goal status: INITIAL  4. Pt will report improvement in communication per PROM. Baseline:  Goal status: INITIAL  ASSESSMENT:  CLINICAL IMPRESSION: Patient is a 83 y.o. 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 Pt known well to SLP services and received ST 04/13/23-05/27/23. 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 Pt reports periods of aphonia and general decline in voice since ending ST sessions. Pt presents with s/sx dysphonia and hypophonia. Voice is c/b auditory perceptual characteristics of hoarse/strained, occasionally breathy voice, and periods of aphonia. Pt with tendency to speak on residual capacity and utilize thoracic and clavicular breathing patterns. Pt stimulable to clear speech techniques (e.g. speak loud and slow) as well as pushing/pulling to improve vocal cord adduction. Pt eager to resume ST sessions to improve communication at both home and in the community. See details of today's tx session above. Recommend course of ST with an intensity based  approach.  OBJECTIVE IMPAIRMENTS include voice disorder. These impairments are limiting patient from effectively communicating at home and in community. Factors affecting potential to achieve goals and functional outcome are co-morbidities. Patient will benefit from skilled SLP services to address above impairments and improve overall function.  REHAB POTENTIAL: Good  PLAN: SLP FREQUENCY: 1-2x/week  SLP DURATION: 12 weeks  PLANNED INTERVENTIONS: Environmental controls, Cueing hierachy, Internal/external aids, Functional tasks, Multimodal communication approach, SLP instruction and feedback, Compensatory strategies, and Patient/family education    Delon Bangs, M.S., CCC-SLP Speech-Language Pathologist [place] [facility] [phone] [place])  [facility] Out[facility] Rehabilitation at [facility] [address] [place], [place], [address] Phone: [phone]   Fax:  [phone]

## 2023-10-14 ENCOUNTER — Ambulatory Visit

## 2023-10-14 DIAGNOSIS — R490 Dysphonia: Secondary | ICD-10-CM

## 2023-10-14 DIAGNOSIS — R498 Other voice and resonance disorders: Secondary | ICD-10-CM

## 2023-10-14 NOTE — Therapy (Signed)
 OUTPATIENT SPEECH LANGUAGE PATHOLOGY VOICE TREATMENT   Patient Name: Norman Mitchell MRN: [id number] DOB:1940/03/24, 83 y.o., male Today's Date: 10/14/2023  PCP: Arlyss Solian, MD  REFERRING PROVIDER: Asberry Schneider, DO   End of Session - 10/14/23 1622     Visit Number 3    Number of Visits 24    Date for SLP Re-Evaluation 12/29/23    SLP Start Time 1530    SLP Stop Time  1620    SLP Time Calculation (min) 50 min    Activity Tolerance Patient tolerated treatment well          Past Medical History:  Diagnosis Date   Arthritis    Hands, knee   Back pain    Complication of anesthesia    Was told after 1 surgery that his HR went really low.  Says his normal HR is 50-55.   DVT (deep venous thrombosis) (HCC) 2017   Bilateral knees.  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   Hypothyroidism    Oral cancer (HCC)    Status post radiation.   Paroxysmal atrial fibrillation (HCC)    Prostatitis    Scoliosis    Thrombocytopenia (HCC)    Venous insufficiency    legs   Past Surgical History:  Procedure Laterality Date   CATARACT EXTRACTION Bilateral    KNEE ARTHROSCOPY     KNEE ARTHROSCOPY WITH MEDIAL MENISECTOMY Right 01/09/2020   Procedure: RIGHT KNEE ARTHROSCOPY WITH MEDIAL MENISECTOMY;  Surgeon: Marchia Drivers, MD;  Location: [facility];  Service: Orthopedics;  Laterality: Right;   Oral biopsy     PILONIDAL CYST EXCISION     PROSTATE BIOPSY     Negative   SEPTOPLASTY     SHOULDER ARTHROSCOPY WITH ROTATOR CUFF REPAIR AND SUBACROMIAL DECOMPRESSION Right 07/02/2020   Procedure: RIGHT SHOULDER ARTHROSCOPY  DISTAL CLAVICLE EXCISION, BICEPS TENODESIS, SUBACROMIAL DECOMPRESSION, ANTERIOR LABRAL REPAIR. AND MINI OPEN ROTATOR CUFF REPAIR;  Surgeon: Krasinski, Kevin, MD;  Location: [facility];  Service: Orthopedics;  Laterality: Right;   TONSILLECTOMY     Patient Active Problem List   Diagnosis Date Noted   Left sided sciatica 05/26/2023   Change in stool 04/28/2023    Medicare annual wellness visit, subsequent 11/29/2022   Tremor 11/29/2022   GERD (gastroesophageal reflux disease) 03/15/2022   Irregular heart rhythm 11/05/2021   White coat syndrome with high blood pressure without hypertension 11/05/2021   Paresthesia 11/10/2020   Erectile dysfunction 11/10/2020   PSA elevation 11/10/2020   Osteoarthritis of right knee 06/21/2020   Synovitis of knee 06/21/2020   Partial thickness rotator cuff tear 06/21/2020   Knee pain 11/12/2019   Actinic keratosis 11/12/2019   Scoliosis 01/09/2019   Advance care planning 12/28/2018   Healthcare maintenance 12/28/2018   Venous insufficiency    Thrombocytopenia (HCC)    Prostatitis    Paroxysmal atrial fibrillation (HCC)    Hypothyroidism    History of oropharyngeal cancer 10/03/2018   Essential hypertension 08/12/2015   Palpitations 08/12/2015    ONSET DATE: 09/28/23 (referral date)    REFERRING DIAG: hypophonia  THERAPY DIAG:  Dysphonia  Hypophonia  Rationale for Evaluation and Treatment Rehabilitation  SUBJECTIVE:   SUBJECTIVE STATEMENT: Pt alert, pleasant, and cooperative Pt accompanied by: self  PERTINENT HISTORY & DIAGNOSTIC FINDINGS:  Pt is an 83 y.o. 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 DaTSCAN , 01/12/23, Bilateral mild decreased putamen activity is a pattern suggestive of  Parkinsonian syndrome pathology. Larygoscopy, 05/13/23, Given vocal cords moving  but with bowing seen by laryngoscopy, and I suspect it is contributing to the hoarseness and breathiness, I let him know that we could refer him to a laryngologist, he would like to complete speech therapy first to see if laryngology would be necessary. Pt had course of ST 04/13/23-05/27/23 with improved vocal quality.   PAIN:  Are you having pain? No  FALLS: Has patient fallen in last 6 months?  No  LIVING ENVIRONMENT: Lives with: lives with their spouse Lives in: House/apartment  PLOF:  Level  of assistance: Independent with ADLs Employment: Retired  PATIENT GOALS    to improve communication in a variety of settings  OBJECTIVE:    TODAY'S TREATMENT:   Pt endorsed completing EMST, 5 sets of 5 breaths daily. Pt to continue for HEP for 5x/week.  Introduced Hospital doctor. Pt educated re: rationale and how to perform ex's.   Pt completed the following at >80 dB with min/mod cues for loud as possible voice: Sustained phonation - eee or hee - x10 reps Ascending Pitch Glides - hee - x5 reps Descending Pitch Glides - hee - x5 reps - Functional Phrases Voice of authority - x10 phrases, x2 reps Voice of alarm - x10 phrases, x2 reps    Conversation: pt required mod cues to maintain loud, clear voice during informal conversational exchanges. Some reports of vocal fatigue noted.    PATIENT EDUCATION: Education details: as above Person educated: Patient Education method: Explanation Education comprehension: verbalized understanding and needs further education       HOME EXERCISE PROGRAM:  Pushing/pulling ex's  EMST  Phorte ex's   GOALS: Goals reviewed with patient? Yes  SHORT TERM GOALS: Target date: 10 sessions  Pt will complete pushing/pulling exercises to improve vocal cord adduction x10 reps with no more than min cueing. Baseline: Goal status: INITIAL  2.  The patient will complete HEP (Maximum duration ah, High/Lows, and Functional Phrases) at average loudness >/= 80 dB and with loud, good quality voice without reports of strain. Baseline:  Goal status: INITIAL  3.  The patient will complete Hierarchical Speech Loudness reading drills (words/phrases, sentences) at average >/= 75 dB and with loud, good quality voice without reports of strain. Baseline:  Goal status: INITIAL  4.  The patient will participate in 5-8 minutes conversation, maintaining average loudness of 75 dB and good quality voice with modified independence.   Baseline:  Goal status: INITIAL  5. Pt will report completing EMST for improve breath support 5x/week (may be completed via combination of at home and in tx sessions).  Baseline:  Goal Status: INITIAL      LONG TERM GOALS: Target date: 12 weeks   The patient will complete HEP (Maximum duration ah, High/Lows, and Functional Phrases) at average loudness >/= 80 dB and with loud, good quality voice indep without reports of strain. Baseline:  Goal status: INITIAL    The patient will participate in 15-20 minutes conversation, maintaining average loudness of 75 dB and good quality voice with modified independence.  Baseline:  Goal status: INITIAL  4. Pt will report improvement in communication per PROM. Baseline:  Goal status: INITIAL  ASSESSMENT:  CLINICAL IMPRESSION: Patient is a 83 y.o. 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 Pt known well to SLP services and received ST 04/13/23-05/27/23. Pt reports hoarseness since radiation tx of of Stage I; T1N1M0, p16 positive, squamous cell carcinoma of the left  base of tongue. Pt reports periods of aphonia and general decline in voice since ending ST sessions. Pt presents with s/sx dysphonia and hypophonia. Voice is c/b auditory perceptual characteristics of hoarse/strained, occasionally breathy voice, and periods of aphonia. Pt with tendency to speak on residual capacity and utilize thoracic and clavicular breathing patterns. Pt stimulable to clear speech techniques (e.g. speak loud and slow) as well as pushing/pulling to improve vocal cord adduction. Pt eager to resume ST sessions to improve communication at both home and in the community. See details of today's tx session above. Recommend course of ST with an intensity based approach.  OBJECTIVE IMPAIRMENTS include voice disorder. These impairments are limiting patient from effectively communicating at home and  in community. Factors affecting potential to achieve goals and functional outcome are co-morbidities. Patient will benefit from skilled SLP services to address above impairments and improve overall function.  REHAB POTENTIAL: Good  PLAN: SLP FREQUENCY: 1-2x/week  SLP DURATION: 12 weeks  PLANNED INTERVENTIONS: Environmental controls, Cueing hierachy, Internal/external aids, Functional tasks, Multimodal communication approach, SLP instruction and feedback, Compensatory strategies, and Patient/family education    Delon Bangs, M.S., CCC-SLP Speech-Language Pathologist [place] [facility] [phone] [place])  [facility] Out[facility] Rehabilitation at [facility] [address] [place], [place], [address] Phone: [phone]   Fax:  [phone]

## 2023-10-19 ENCOUNTER — Ambulatory Visit

## 2023-10-19 DIAGNOSIS — R490 Dysphonia: Secondary | ICD-10-CM

## 2023-10-19 DIAGNOSIS — R498 Other voice and resonance disorders: Secondary | ICD-10-CM

## 2023-10-19 NOTE — Therapy (Signed)
 OUTPATIENT SPEECH LANGUAGE PATHOLOGY VOICE TREATMENT   Patient Name: Norman Mitchell MRN: [id number] DOB:1940/08/11, 83 y.o., male Today's Date: 10/19/2023  PCP: Arlyss Solian, MD  REFERRING PROVIDER: Asberry Schneider, DO   End of Session - 10/19/23 1524     Visit Number 4    Number of Visits 24    Date for SLP Re-Evaluation 12/29/23    SLP Start Time 1530    SLP Stop Time  1615    SLP Time Calculation (min) 45 min    Activity Tolerance Patient tolerated treatment well          Past Medical History:  Diagnosis Date   Arthritis    Hands, knee   Back pain    Complication of anesthesia    Was told after 1 surgery that his HR went really low.  Says his normal HR is 50-55.   DVT (deep venous thrombosis) (HCC) 2017   Bilateral knees.  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   Hypothyroidism    Oral cancer (HCC)    Status post radiation.   Paroxysmal atrial fibrillation (HCC)    Prostatitis    Scoliosis    Thrombocytopenia (HCC)    Venous insufficiency    legs   Past Surgical History:  Procedure Laterality Date   CATARACT EXTRACTION Bilateral    KNEE ARTHROSCOPY     KNEE ARTHROSCOPY WITH MEDIAL MENISECTOMY Right 01/09/2020   Procedure: RIGHT KNEE ARTHROSCOPY WITH MEDIAL MENISECTOMY;  Surgeon: Marchia Drivers, MD;  Location: [facility];  Service: Orthopedics;  Laterality: Right;   Oral biopsy     PILONIDAL CYST EXCISION     PROSTATE BIOPSY     Negative   SEPTOPLASTY     SHOULDER ARTHROSCOPY WITH ROTATOR CUFF REPAIR AND SUBACROMIAL DECOMPRESSION Right 07/02/2020   Procedure: RIGHT SHOULDER ARTHROSCOPY  DISTAL CLAVICLE EXCISION, BICEPS TENODESIS, SUBACROMIAL DECOMPRESSION, ANTERIOR LABRAL REPAIR. AND MINI OPEN ROTATOR CUFF REPAIR;  Surgeon: Krasinski, Kevin, MD;  Location: [facility];  Service: Orthopedics;  Laterality: Right;   TONSILLECTOMY     Patient Active Problem List   Diagnosis Date Noted   Left sided sciatica 05/26/2023   Change in stool 04/28/2023    Medicare annual wellness visit, subsequent 11/29/2022   Tremor 11/29/2022   GERD (gastroesophageal reflux disease) 03/15/2022   Irregular heart rhythm 11/05/2021   White coat syndrome with high blood pressure without hypertension 11/05/2021   Paresthesia 11/10/2020   Erectile dysfunction 11/10/2020   PSA elevation 11/10/2020   Osteoarthritis of right knee 06/21/2020   Synovitis of knee 06/21/2020   Partial thickness rotator cuff tear 06/21/2020   Knee pain 11/12/2019   Actinic keratosis 11/12/2019   Scoliosis 01/09/2019   Advance care planning 12/28/2018   Healthcare maintenance 12/28/2018   Venous insufficiency    Thrombocytopenia (HCC)    Prostatitis    Paroxysmal atrial fibrillation (HCC)    Hypothyroidism    History of oropharyngeal cancer 10/03/2018   Essential hypertension 08/12/2015   Palpitations 08/12/2015    ONSET DATE: 09/28/23 (referral date)    REFERRING DIAG: hypophonia  THERAPY DIAG:  Dysphonia  Hypophonia  Rationale for Evaluation and Treatment Rehabilitation  SUBJECTIVE:   SUBJECTIVE STATEMENT: Pt alert, pleasant, and cooperative Pt accompanied by: self  PERTINENT HISTORY & DIAGNOSTIC FINDINGS:  Pt is an 83 y.o. 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 DaTSCAN , 01/12/23, Bilateral mild decreased putamen activity is a pattern suggestive of  Parkinsonian syndrome pathology. Larygoscopy, 05/13/23, Given vocal cords moving  but with bowing seen by laryngoscopy, and I suspect it is contributing to the hoarseness and breathiness, I let him know that we could refer him to a laryngologist, he would like to complete speech therapy first to see if laryngology would be necessary. Pt had course of ST 04/13/23-05/27/23 with improved vocal quality.   PAIN:  Are you having pain? No  FALLS: Has patient fallen in last 6 months?  No  LIVING ENVIRONMENT: Lives with: lives with their spouse Lives in: House/apartment  PLOF:  Level  of assistance: Independent with ADLs Employment: Retired  PATIENT GOALS    to improve communication in a variety of settings  OBJECTIVE:    TODAY'S TREATMENT:   Pt endorsed completing EMST, 5 sets of 5 breaths daily. Pt to continue for HEP for 5x/week.  Reviewed Phonation Resistance Training Exercises. Pt educated re: rationale and how to perform ex's.   Pt completed the following at >80 dB with min/mod cues for loud as possible voice: Sustained phonation - eee or hee - x10 reps Ascending Pitch Glides - hee - x5 reps Descending Pitch Glides - hee - x5 reps - Functional Phrases Voice of authority - x10 phrases, x2 reps Voice of alarm - x10 phrases, x2 reps    Conversation: pt required mod/max cues to maintain loud, clear voice during informal conversational exchanges. Some reports of vocal fatigue and reduced endurance noted.    PATIENT EDUCATION: Education details: as above Person educated: Patient Education method: Explanation Education comprehension: verbalized understanding and needs further education       HOME EXERCISE PROGRAM:  Pushing/pulling ex's  EMST  Phorte ex's   GOALS: Goals reviewed with patient? Yes  SHORT TERM GOALS: Target date: 10 sessions  Pt will complete pushing/pulling exercises to improve vocal cord adduction x10 reps with no more than min cueing. Baseline: Goal status: INITIAL  2.  The patient will complete HEP (Maximum duration ah, High/Lows, and Functional Phrases) at average loudness >/= 80 dB and with loud, good quality voice without reports of strain. Baseline:  Goal status: INITIAL  3.  The patient will complete Hierarchical Speech Loudness reading drills (words/phrases, sentences) at average >/= 75 dB and with loud, good quality voice without reports of strain. Baseline:  Goal status: INITIAL  4.  The patient will participate in 5-8 minutes conversation, maintaining average loudness of 75 dB and good quality voice with  modified independence.  Baseline:  Goal status: INITIAL  5. Pt will report completing EMST for improve breath support 5x/week (may be completed via combination of at home and in tx sessions).  Baseline:  Goal Status: INITIAL      LONG TERM GOALS: Target date: 12 weeks   The patient will complete HEP (Maximum duration ah, High/Lows, and Functional Phrases) at average loudness >/= 80 dB and with loud, good quality voice indep without reports of strain. Baseline:  Goal status: INITIAL    The patient will participate in 15-20 minutes conversation, maintaining average loudness of 75 dB and good quality voice with modified independence.  Baseline:  Goal status: INITIAL  4. Pt will report improvement in communication per PROM. Baseline:  Goal status: INITIAL  ASSESSMENT:  CLINICAL IMPRESSION: Patient is a 83 y.o. 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 Pt known well to SLP services and received ST 04/13/23-05/27/23. 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 Pt reports periods of aphonia and general decline in voice since ending ST sessions. Pt presents with s/sx dysphonia and hypophonia. Voice is c/b auditory perceptual characteristics of hoarse/strained, occasionally breathy voice, and periods of aphonia. Pt with tendency to speak on residual capacity and utilize thoracic and clavicular breathing patterns. Pt stimulable to clear speech techniques (e.g. speak loud and slow) as well as pushing/pulling to improve vocal cord adduction. Pt eager to resume ST sessions to improve communication at both home and in the community. See details of today's tx session above. Recommend course of ST with an intensity based approach.  OBJECTIVE IMPAIRMENTS include voice disorder. These impairments are limiting patient from effectively  communicating at home and in community. Factors affecting potential to achieve goals and functional outcome are co-morbidities. Patient will benefit from skilled SLP services to address above impairments and improve overall function.  REHAB POTENTIAL: Good  PLAN: SLP FREQUENCY: 1-2x/week  SLP DURATION: 12 weeks  PLANNED INTERVENTIONS: Environmental controls, Cueing hierachy, Internal/external aids, Functional tasks, Multimodal communication approach, SLP instruction and feedback, Compensatory strategies, and Patient/family education    Delon Bangs, M.S., CCC-SLP Speech-Language Pathologist [facility] [phone] [place])  [facility] Rehabilitation at [facility] [address] [place], [place], [address] Phone: [phone]   Fax:  [phone]

## 2023-10-26 ENCOUNTER — Other Ambulatory Visit: Payer: Self-pay | Admitting: Family Medicine

## 2023-10-26 ENCOUNTER — Ambulatory Visit

## 2023-10-26 DIAGNOSIS — R498 Other voice and resonance disorders: Secondary | ICD-10-CM

## 2023-10-26 DIAGNOSIS — E039 Hypothyroidism, unspecified: Secondary | ICD-10-CM

## 2023-10-26 DIAGNOSIS — R490 Dysphonia: Secondary | ICD-10-CM | POA: Diagnosis not present

## 2023-10-26 NOTE — Therapy (Signed)
 OUTPATIENT SPEECH LANGUAGE PATHOLOGY VOICE TREATMENT   Patient Name: Norman Mitchell MRN: [id number] DOB:[birth year], 83 y.o., male Today's Date: 10/26/2023  PCP: Arlyss Solian, MD  REFERRING PROVIDER: Asberry Schneider, DO   End of Session - 10/26/23 1306     Visit Number 5    Number of Visits 24    Date for SLP Re-Evaluation 12/29/23    SLP Start Time 1310    SLP Stop Time  1355    SLP Time Calculation (min) 45 min    Activity Tolerance Patient tolerated treatment well          Past Medical History:  Diagnosis Date   Arthritis    Hands, knee   Back pain    Complication of anesthesia    Was told after 1 surgery that his HR went really low.  Says his normal HR is 50-55.   DVT (deep venous thrombosis) (HCC) 2017   Bilateral knees.  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   Hypothyroidism    Oral cancer (HCC)    Status post radiation.   Paroxysmal atrial fibrillation (HCC)    Prostatitis    Scoliosis    Thrombocytopenia (HCC)    Venous insufficiency    legs   Past Surgical History:  Procedure Laterality Date   CATARACT EXTRACTION Bilateral    KNEE ARTHROSCOPY     KNEE ARTHROSCOPY WITH MEDIAL MENISECTOMY Right 01/09/2020   Procedure: RIGHT KNEE ARTHROSCOPY WITH MEDIAL MENISECTOMY;  Surgeon: Marchia Drivers, MD;  Location: [facility];  Service: Orthopedics;  Laterality: Right;   Oral biopsy     PILONIDAL CYST EXCISION     PROSTATE BIOPSY     Negative   SEPTOPLASTY     SHOULDER ARTHROSCOPY WITH ROTATOR CUFF REPAIR AND SUBACROMIAL DECOMPRESSION Right 07/02/2020   Procedure: RIGHT SHOULDER ARTHROSCOPY  DISTAL CLAVICLE EXCISION, BICEPS TENODESIS, SUBACROMIAL DECOMPRESSION, ANTERIOR LABRAL REPAIR. AND MINI OPEN ROTATOR CUFF REPAIR;  Surgeon: Krasinski, Kevin, MD;  Location: [facility];  Service: Orthopedics;  Laterality: Right;   TONSILLECTOMY     Patient Active Problem List   Diagnosis Date Noted   Left sided sciatica 05/26/2023   Change in stool 04/28/2023    Medicare annual wellness visit, subsequent 11/29/2022   Tremor 11/29/2022   GERD (gastroesophageal reflux disease) 03/15/2022   Irregular heart rhythm 11/05/2021   White coat syndrome with high blood pressure without hypertension 11/05/2021   Paresthesia 11/10/2020   Erectile dysfunction 11/10/2020   PSA elevation 11/10/2020   Osteoarthritis of right knee 06/21/2020   Synovitis of knee 06/21/2020   Partial thickness rotator cuff tear 06/21/2020   Knee pain 11/12/2019   Actinic keratosis 11/12/2019   Scoliosis 01/09/2019   Advance care planning 12/28/2018   Healthcare maintenance 12/28/2018   Venous insufficiency    Thrombocytopenia (HCC)    Prostatitis    Paroxysmal atrial fibrillation (HCC)    Hypothyroidism    History of oropharyngeal cancer 10/03/2018   Essential hypertension 08/12/2015   Palpitations 08/12/2015    ONSET DATE: 09/28/23 (referral date)    REFERRING DIAG: hypophonia  THERAPY DIAG:  Dysphonia  Hypophonia  Rationale for Evaluation and Treatment Rehabilitation  SUBJECTIVE:   SUBJECTIVE STATEMENT: Pt alert, pleasant, and cooperative Pt accompanied by: self  PERTINENT HISTORY & DIAGNOSTIC FINDINGS:  Pt is an 83 y.o. 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 DaTSCAN , 01/12/23, Bilateral mild decreased putamen activity is a pattern suggestive of  Parkinsonian syndrome pathology. Larygoscopy, 05/13/23, Given vocal cords moving  but with bowing seen by laryngoscopy, and I suspect it is contributing to the hoarseness and breathiness, I let him know that we could refer him to a laryngologist, he would like to complete speech therapy first to see if laryngology would be necessary. Pt had course of ST 04/13/23-05/27/23 with improved vocal quality.   PAIN:  Are you having pain? No  FALLS: Has patient fallen in last 6 months?  No  LIVING ENVIRONMENT: Lives with: lives with their spouse Lives in: House/apartment  PLOF:  Level  of assistance: Independent with ADLs Employment: Retired  PATIENT GOALS    to improve communication in a variety of settings  OBJECTIVE:    TODAY'S TREATMENT:   Pt endorsed completing EMST, 5 sets of 5 breaths daily. Pt to continue for HEP for 5x/week.  Reviewed Phonation Resistance Training Exercises. Pt educated re: rationale and how to perform ex's.   Pt completed the following at >80 dB with min/mod cues for loud as possible voice: Sustained phonation - eee or hee - x5reps Ascending Pitch Glides - hee - x5 reps Descending Pitch Glides - hee - x5 reps - Functional Phrases Voice of authority - x10 phrases, x2 reps Voice of alarm - x10 phrases, x2 reps    Conversation: pt required mod/max cues to maintain loud, clear voice during informal conversational exchanges. Some reports of vocal fatigue and reduced endurance noted.    PATIENT EDUCATION: Education details: as above Person educated: Patient Education method: Explanation Education comprehension: verbalized understanding and needs further education       HOME EXERCISE PROGRAM:  Pushing/pulling ex's  EMST  Phorte ex's   GOALS: Goals reviewed with patient? Yes  SHORT TERM GOALS: Target date: 10 sessions  Pt will complete pushing/pulling exercises to improve vocal cord adduction x10 reps with no more than min cueing. Baseline: Goal status: INITIAL  2.  The patient will complete HEP (Maximum duration ah, High/Lows, and Functional Phrases) at average loudness >/= 80 dB and with loud, good quality voice without reports of strain. Baseline:  Goal status: INITIAL  3.  The patient will complete Hierarchical Speech Loudness reading drills (words/phrases, sentences) at average >/= 75 dB and with loud, good quality voice without reports of strain. Baseline:  Goal status: INITIAL  4.  The patient will participate in 5-8 minutes conversation, maintaining average loudness of 75 dB and good quality voice with  modified independence.  Baseline:  Goal status: INITIAL  5. Pt will report completing EMST for improve breath support 5x/week (may be completed via combination of at home and in tx sessions).  Baseline:  Goal Status: INITIAL      LONG TERM GOALS: Target date: 12 weeks   The patient will complete HEP (Maximum duration ah, High/Lows, and Functional Phrases) at average loudness >/= 80 dB and with loud, good quality voice indep without reports of strain. Baseline:  Goal status: INITIAL    The patient will participate in 15-20 minutes conversation, maintaining average loudness of 75 dB and good quality voice with modified independence.  Baseline:  Goal status: INITIAL  4. Pt will report improvement in communication per PROM. Baseline:  Goal status: INITIAL  ASSESSMENT:  CLINICAL IMPRESSION: Patient is a 83 y.o. 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 Pt known well to SLP services and received ST 04/13/23-05/27/23. 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 Pt reports periods of aphonia and general decline in voice since ending ST sessions. Pt presents with s/sx dysphonia and hypophonia. Voice is c/b auditory perceptual characteristics of hoarse/strained, occasionally breathy voice, and periods of aphonia. Pt with tendency to speak on residual capacity and utilize thoracic and clavicular breathing patterns. Pt stimulable to clear speech techniques (e.g. speak loud and slow) as well as pushing/pulling to improve vocal cord adduction. Pt eager to resume ST sessions to improve communication at both home and in the community. See details of today's tx session above. Recommend course of ST with an intensity based approach.  OBJECTIVE IMPAIRMENTS include voice disorder. These impairments are limiting patient from effectively  communicating at home and in community. Factors affecting potential to achieve goals and functional outcome are co-morbidities. Patient will benefit from skilled SLP services to address above impairments and improve overall function.  REHAB POTENTIAL: Good  PLAN: SLP FREQUENCY: 1-2x/week  SLP DURATION: 12 weeks  PLANNED INTERVENTIONS: Environmental controls, Cueing hierachy, Internal/external aids, Functional tasks, Multimodal communication approach, SLP instruction and feedback, Compensatory strategies, and Patient/family education    Delon Bangs, M.S., CCC-SLP Speech-Language Pathologist [facility] [phone] [place])  [place] [facility] Out[facility] Rehabilitation at [facility] [address] [place], [place], [address] Phone: [phone]   Fax:  [phone]

## 2023-10-27 ENCOUNTER — Ambulatory Visit

## 2023-10-27 DIAGNOSIS — R490 Dysphonia: Secondary | ICD-10-CM | POA: Diagnosis not present

## 2023-10-27 DIAGNOSIS — R498 Other voice and resonance disorders: Secondary | ICD-10-CM

## 2023-10-27 NOTE — Therapy (Signed)
 OUTPATIENT SPEECH LANGUAGE PATHOLOGY VOICE TREATMENT   Patient Name: Norman Mitchell MRN: [id number] DOB:31-Dec-1940, 83 y.o., male Today's Date: 10/27/2023  PCP: Arlyss Solian, MD  REFERRING PROVIDER: Asberry Schneider, DO   End of Session - 10/27/23 1449     Visit Number 6    Number of Visits 24    Date for SLP Re-Evaluation 12/29/23    SLP Start Time 1400    SLP Stop Time  1445    SLP Time Calculation (min) 45 min    Activity Tolerance Patient tolerated treatment well          Past Medical History:  Diagnosis Date   Arthritis    Hands, knee   Back pain    Complication of anesthesia    Was told after 1 surgery that his HR went really low.  Says his normal HR is 50-55.   DVT (deep venous thrombosis) (HCC) 2017   Bilateral knees.  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   Hypothyroidism    Oral cancer (HCC)    Status post radiation.   Paroxysmal atrial fibrillation (HCC)    Prostatitis    Scoliosis    Thrombocytopenia (HCC)    Venous insufficiency    legs   Past Surgical History:  Procedure Laterality Date   CATARACT EXTRACTION Bilateral    KNEE ARTHROSCOPY     KNEE ARTHROSCOPY WITH MEDIAL MENISECTOMY Right 01/09/2020   Procedure: RIGHT KNEE ARTHROSCOPY WITH MEDIAL MENISECTOMY;  Surgeon: Marchia Drivers, MD;  Location: [facility];  Service: Orthopedics;  Laterality: Right;   Oral biopsy     PILONIDAL CYST EXCISION     PROSTATE BIOPSY     Negative   SEPTOPLASTY     SHOULDER ARTHROSCOPY WITH ROTATOR CUFF REPAIR AND SUBACROMIAL DECOMPRESSION Right 07/02/2020   Procedure: RIGHT SHOULDER ARTHROSCOPY  DISTAL CLAVICLE EXCISION, BICEPS TENODESIS, SUBACROMIAL DECOMPRESSION, ANTERIOR LABRAL REPAIR. AND MINI OPEN ROTATOR CUFF REPAIR;  Surgeon: Krasinski, Kevin, MD;  Location: [facility];  Service: Orthopedics;  Laterality: Right;   TONSILLECTOMY     Patient Active Problem List   Diagnosis Date Noted   Left sided sciatica 05/26/2023   Change in stool 04/28/2023    Medicare annual wellness visit, subsequent 11/29/2022   Tremor 11/29/2022   GERD (gastroesophageal reflux disease) 03/15/2022   Irregular heart rhythm 11/05/2021   White coat syndrome with high blood pressure without hypertension 11/05/2021   Paresthesia 11/10/2020   Erectile dysfunction 11/10/2020   PSA elevation 11/10/2020   Osteoarthritis of right knee 06/21/2020   Synovitis of knee 06/21/2020   Partial thickness rotator cuff tear 06/21/2020   Knee pain 11/12/2019   Actinic keratosis 11/12/2019   Scoliosis 01/09/2019   Advance care planning 12/28/2018   Healthcare maintenance 12/28/2018   Venous insufficiency    Thrombocytopenia (HCC)    Prostatitis    Paroxysmal atrial fibrillation (HCC)    Hypothyroidism    History of oropharyngeal cancer 10/03/2018   Essential hypertension 08/12/2015   Palpitations 08/12/2015    ONSET DATE: 09/28/23 (referral date)    REFERRING DIAG: hypophonia  THERAPY DIAG:  Dysphonia  Hypophonia  Rationale for Evaluation and Treatment Rehabilitation  SUBJECTIVE:   SUBJECTIVE STATEMENT: Pt alert, pleasant, and cooperative Pt accompanied by: self  PERTINENT HISTORY & DIAGNOSTIC FINDINGS:  Pt is an 83 y.o. 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 DaTSCAN , 01/12/23, Bilateral mild decreased putamen activity is a pattern suggestive of  Parkinsonian syndrome pathology. Larygoscopy, 05/13/23, Given vocal cords moving  but with bowing seen by laryngoscopy, and I suspect it is contributing to the hoarseness and breathiness, I let him know that we could refer him to a laryngologist, he would like to complete speech therapy first to see if laryngology would be necessary. Pt had course of ST 04/13/23-05/27/23 with improved vocal quality.   PAIN:  Are you having pain? No  FALLS: Has patient fallen in last 6 months?  No  LIVING ENVIRONMENT: Lives with: lives with their spouse Lives in: House/apartment  PLOF:  Level  of assistance: Independent with ADLs Employment: Retired  PATIENT GOALS    to improve communication in a variety of settings  OBJECTIVE:    TODAY'S TREATMENT:   Pt endorsed completing EMST, 5 sets of 5 breaths daily. Pt to continue for HEP for 5x/week.  Reviewed Phonation Resistance Training Exercises. Pt educated re: rationale and how to perform ex's.   Pt endorsed completing ex's earlier today with exception of x2 reps of phrases in both Voice of Alarm and Voice of Authority. Pt completed at >80 dB.     Conversation: pt required min/mod cues to maintain loud, clear voice during informal conversational exchanges and conversation sample (>20 minutes). Some reports of vocal fatigue and minimally endurance noted. Improving ability to utilize deeper, habitual voice and breath support to improve dysphonia. Marked improvement in dysphonia this date that patient stated has persisted since yesterday afternoon.   PATIENT EDUCATION: Education details: as above Person educated: Patient Education method: Explanation Education comprehension: verbalized understanding and needs further education       HOME EXERCISE PROGRAM:  Pushing/pulling ex's  EMST  Phorte ex's   GOALS: Goals reviewed with patient? Yes  SHORT TERM GOALS: Target date: 10 sessions  Pt will complete pushing/pulling exercises to improve vocal cord adduction x10 reps with no more than min cueing. Baseline: Goal status: INITIAL  2.  The patient will complete HEP (Maximum duration ah, High/Lows, and Functional Phrases) at average loudness >/= 80 dB and with loud, good quality voice without reports of strain. Baseline:  Goal status: INITIAL  3.  The patient will complete Hierarchical Speech Loudness reading drills (words/phrases, sentences) at average >/= 75 dB and with loud, good quality voice without reports of strain. Baseline:  Goal status: INITIAL  4.  The patient will participate in 5-8 minutes conversation,  maintaining average loudness of 75 dB and good quality voice with modified independence.  Baseline:  Goal status: INITIAL  5. Pt will report completing EMST for improve breath support 5x/week (may be completed via combination of at home and in tx sessions).  Baseline:  Goal Status: INITIAL      LONG TERM GOALS: Target date: 12 weeks   The patient will complete HEP (Maximum duration ah, High/Lows, and Functional Phrases) at average loudness >/= 80 dB and with loud, good quality voice indep without reports of strain. Baseline:  Goal status: INITIAL    The patient will participate in 15-20 minutes conversation, maintaining average loudness of 75 dB and good quality voice with modified independence.  Baseline:  Goal status: INITIAL  4. Pt will report improvement in communication per PROM. Baseline:  Goal status: INITIAL  ASSESSMENT:  CLINICAL IMPRESSION: Patient is a 83 y.o. 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 Pt known well to SLP services and received ST 04/13/23-05/27/23. 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 Pt reports periods of aphonia and general decline in voice since ending ST sessions. Pt presents with s/sx dysphonia and hypophonia. Voice is c/b auditory perceptual characteristics of hoarse/strained, occasionally breathy voice, and periods of aphonia. Pt with tendency to speak on residual capacity and utilize thoracic and clavicular breathing patterns. Pt stimulable to clear speech techniques (e.g. speak loud and slow) as well as pushing/pulling to improve vocal cord adduction. Pt eager to resume ST sessions to improve communication at both home and in the community. See details of today's tx session above. Recommend course of ST with an intensity based approach.  OBJECTIVE IMPAIRMENTS include voice  disorder. These impairments are limiting patient from effectively communicating at home and in community. Factors affecting potential to achieve goals and functional outcome are co-morbidities. Patient will benefit from skilled SLP services to address above impairments and improve overall function.  REHAB POTENTIAL: Good  PLAN: SLP FREQUENCY: 1-2x/week  SLP DURATION: 12 weeks  PLANNED INTERVENTIONS: Environmental controls, Cueing hierachy, Internal/external aids, Functional tasks, Multimodal communication approach, SLP instruction and feedback, Compensatory strategies, and Patient/family education    Delon Bangs, M.S., CCC-SLP Speech-Language Pathologist [facility] [phone] [place])  [facility] Outpatient Rehabilitation at [facility] [address] [place], [place], [address] Phone: [phone]   Fax:  [phone]

## 2023-11-01 ENCOUNTER — Other Ambulatory Visit: Payer: Self-pay | Admitting: Family Medicine

## 2023-11-01 DIAGNOSIS — E039 Hypothyroidism, unspecified: Secondary | ICD-10-CM

## 2023-11-01 DIAGNOSIS — Z125 Encounter for screening for malignant neoplasm of prostate: Secondary | ICD-10-CM

## 2023-11-01 DIAGNOSIS — Z8249 Family history of ischemic heart disease and other diseases of the circulatory system: Secondary | ICD-10-CM

## 2023-11-02 ENCOUNTER — Encounter: Payer: Self-pay | Admitting: Family Medicine

## 2023-11-02 ENCOUNTER — Ambulatory Visit: Attending: Neurology

## 2023-11-02 ENCOUNTER — Ambulatory Visit (INDEPENDENT_AMBULATORY_CARE_PROVIDER_SITE_OTHER): Admitting: Family Medicine

## 2023-11-02 ENCOUNTER — Other Ambulatory Visit (INDEPENDENT_AMBULATORY_CARE_PROVIDER_SITE_OTHER): Payer: Medicare Other

## 2023-11-02 ENCOUNTER — Ambulatory Visit (INDEPENDENT_AMBULATORY_CARE_PROVIDER_SITE_OTHER)
Admission: RE | Admit: 2023-11-02 | Discharge: 2023-11-02 | Disposition: A | Discharge status: Home / Self Care | Source: Ambulatory Visit | Attending: Family Medicine | Admitting: Family Medicine

## 2023-11-02 VITALS — BP 132/74 | HR 50 | Temp 97.9°F | Ht 69.0 in | Wt 173.0 lb

## 2023-11-02 DIAGNOSIS — Z125 Encounter for screening for malignant neoplasm of prostate: Secondary | ICD-10-CM | POA: Diagnosis not present

## 2023-11-02 DIAGNOSIS — M25551 Pain in right hip: Secondary | ICD-10-CM | POA: Diagnosis not present

## 2023-11-02 DIAGNOSIS — R498 Other voice and resonance disorders: Secondary | ICD-10-CM | POA: Diagnosis present

## 2023-11-02 DIAGNOSIS — R490 Dysphonia: Secondary | ICD-10-CM | POA: Diagnosis present

## 2023-11-02 DIAGNOSIS — E039 Hypothyroidism, unspecified: Secondary | ICD-10-CM

## 2023-11-02 DIAGNOSIS — M25559 Pain in unspecified hip: Secondary | ICD-10-CM

## 2023-11-02 DIAGNOSIS — Z8249 Family history of ischemic heart disease and other diseases of the circulatory system: Secondary | ICD-10-CM

## 2023-11-02 LAB — COMPREHENSIVE METABOLIC PANEL WITH GFR
ALT: 15 U/L (ref 0–53)
AST: 24 U/L (ref 0–37)
Albumin: 4.3 g/dL (ref 3.5–5.2)
Alkaline Phosphatase: 72 U/L (ref 39–117)
BUN: 27 mg/dL — ABNORMAL HIGH (ref 6–23)
CO2: 30 meq/L (ref 19–32)
Calcium: 8.8 mg/dL (ref 8.4–10.5)
Chloride: 102 meq/L (ref 96–112)
Creatinine, Ser: 1.19 mg/dL (ref 0.40–1.50)
GFR: 57.5 mL/min — ABNORMAL LOW (ref >60.00)
Glucose, Bld: 91 mg/dL (ref 70–99)
Potassium: 4 meq/L (ref 3.5–5.1)
Sodium: 140 meq/L (ref 135–145)
Total Bilirubin: 2 mg/dL — ABNORMAL HIGH (ref 0.2–1.2)
Total Protein: 6.6 g/dL (ref 6.0–8.3)

## 2023-11-02 LAB — CBC WITH DIFFERENTIAL/PLATELET
Basophils Absolute: 0 K/uL (ref 0.0–0.1)
Basophils Relative: 0.8 % (ref 0.0–3.0)
Eosinophils Absolute: 0.1 K/uL (ref 0.0–0.7)
Eosinophils Relative: 2.6 % (ref 0.0–5.0)
HCT: 45.1 % (ref 39.0–52.0)
Hemoglobin: 15.2 g/dL (ref 13.0–17.0)
Lymphocytes Relative: 18.4 % (ref 12.0–46.0)
Lymphs Abs: 0.9 K/uL (ref 0.7–4.0)
MCHC: 33.6 g/dL (ref 30.0–36.0)
MCV: 88.7 fl (ref 78.0–100.0)
Monocytes Absolute: 0.6 K/uL (ref 0.1–1.0)
Monocytes Relative: 12.7 % — ABNORMAL HIGH (ref 3.0–12.0)
Neutro Abs: 3.2 K/uL (ref 1.4–7.7)
Neutrophils Relative %: 65.5 % (ref 43.0–77.0)
Platelets: 107 K/uL — ABNORMAL LOW (ref 150.0–400.0)
RBC: 5.09 Mil/uL (ref 4.22–5.81)
RDW: 14.1 % (ref 11.5–15.5)
WBC: 4.9 K/uL (ref 4.0–10.5)

## 2023-11-02 LAB — TSH: TSH: 3.73 u[IU]/mL (ref 0.35–5.50)

## 2023-11-02 LAB — LIPID PANEL
Cholesterol: 145 mg/dL (ref 0–200)
HDL: 62.4 mg/dL (ref >39.00)
LDL Cholesterol: 72 mg/dL (ref 0–99)
NonHDL: 82.63
Total CHOL/HDL Ratio: 2
Triglycerides: 52 mg/dL (ref 0.0–149.0)
VLDL: 10.4 mg/dL (ref 0.0–40.0)

## 2023-11-02 LAB — PSA, MEDICARE: PSA: 4.24 ng/mL — ABNORMAL HIGH (ref 0.10–4.00)

## 2023-11-02 NOTE — Progress Notes (Unsigned)
 Worsening R hip pain.  Not acute.    H/o pain in the 1970s, went to PT and massage therapy.  Then it improved.  Would have flares in the meantime, over the years.  1st episodes in the last few years.  Improved with stretching his lower back/doing figure four stretch.  He has to stop and stretch after walking 1/8th mile.  Stretching helps.  Pain with prolonged walking.  Also has pain with prolonged standing, standing at the counter to brush his teeth.  Sitting down clearly helps.  Pain laying on R side, at the iliac crest on the R side.  Can radiate into the upper R quad on walking.    Meds, vitals, and allergies reviewed.   ROS: Per HPI unless specifically indicated in ROS section   R iliopsoas?

## 2023-11-02 NOTE — Patient Instructions (Addendum)
 Refer to PT.  Let me know if you can't get set up.  Try diclofenac gel as needed, a few times a day.  Try icing and stretching.  Update me as needed.  Xray on the way out.  Take care.  Glad to see you.

## 2023-11-02 NOTE — Therapy (Signed)
 OUTPATIENT SPEECH LANGUAGE PATHOLOGY VOICE TREATMENT   Patient Name: Norman Mitchell MRN: [id number] DOB:30-May-1940, 83 y.o., male Today's Date: 11/02/2023  PCP: Arlyss Solian, MD  REFERRING PROVIDER: Asberry Schneider, DO   End of Session - 11/02/23 1212     Visit Number 7    Number of Visits 24    Date for SLP Re-Evaluation 12/29/23    SLP Start Time 1055    SLP Stop Time  1140    SLP Time Calculation (min) 45 min    Activity Tolerance Patient tolerated treatment well          Past Medical History:  Diagnosis Date   Arthritis    Hands, knee   Back pain    Complication of anesthesia    Was told after 1 surgery that his HR went really low.  Says his normal HR is 50-55.   DVT (deep venous thrombosis) (HCC) 2017   Bilateral knees.  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   Hypothyroidism    Oral cancer (HCC)    Status post radiation.   Paroxysmal atrial fibrillation (HCC)    Prostatitis    Scoliosis    Thrombocytopenia (HCC)    Venous insufficiency    legs   Past Surgical History:  Procedure Laterality Date   CATARACT EXTRACTION Bilateral    KNEE ARTHROSCOPY     KNEE ARTHROSCOPY WITH MEDIAL MENISECTOMY Right 01/09/2020   Procedure: RIGHT KNEE ARTHROSCOPY WITH MEDIAL MENISECTOMY;  Surgeon: Marchia Drivers, MD;  Location: [facility];  Service: Orthopedics;  Laterality: Right;   Oral biopsy     PILONIDAL CYST EXCISION     PROSTATE BIOPSY     Negative   SEPTOPLASTY     SHOULDER ARTHROSCOPY WITH ROTATOR CUFF REPAIR AND SUBACROMIAL DECOMPRESSION Right 07/02/2020   Procedure: RIGHT SHOULDER ARTHROSCOPY  DISTAL CLAVICLE EXCISION, BICEPS TENODESIS, SUBACROMIAL DECOMPRESSION, ANTERIOR LABRAL REPAIR. AND MINI OPEN ROTATOR CUFF REPAIR;  Surgeon: Krasinski, Kevin, MD;  Location: [facility];  Service: Orthopedics;  Laterality: Right;   TONSILLECTOMY     Patient Active Problem List   Diagnosis Date Noted   Left sided sciatica 05/26/2023   Change in stool 04/28/2023    Medicare annual wellness visit, subsequent 11/29/2022   Tremor 11/29/2022   GERD (gastroesophageal reflux disease) 03/15/2022   Irregular heart rhythm 11/05/2021   White coat syndrome with high blood pressure without hypertension 11/05/2021   Paresthesia 11/10/2020   Erectile dysfunction 11/10/2020   PSA elevation 11/10/2020   Osteoarthritis of right knee 06/21/2020   Synovitis of knee 06/21/2020   Partial thickness rotator cuff tear 06/21/2020   Knee pain 11/12/2019   Actinic keratosis 11/12/2019   Scoliosis 01/09/2019   Advance care planning 12/28/2018   Healthcare maintenance 12/28/2018   Venous insufficiency    Thrombocytopenia (HCC)    Prostatitis    Paroxysmal atrial fibrillation (HCC)    Hypothyroidism    History of oropharyngeal cancer 10/03/2018   Essential hypertension 08/12/2015   Palpitations 08/12/2015    ONSET DATE: 09/28/23 (referral date)    REFERRING DIAG: hypophonia  THERAPY DIAG:  Dysphonia  Hypophonia  Rationale for Evaluation and Treatment Rehabilitation  SUBJECTIVE:   SUBJECTIVE STATEMENT: Pt alert, pleasant, and cooperative Pt accompanied by: self  PERTINENT HISTORY & DIAGNOSTIC FINDINGS:  Pt is an 83 y.o. 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 DaTSCAN , 01/12/23, Bilateral mild decreased putamen activity is a pattern suggestive of  Parkinsonian syndrome pathology. Larygoscopy, 05/13/23, Given vocal cords moving  but with bowing seen by laryngoscopy, and I suspect it is contributing to the hoarseness and breathiness, I let him know that we could refer him to a laryngologist, he would like to complete speech therapy first to see if laryngology would be necessary. Pt had course of ST 04/13/23-05/27/23 with improved vocal quality.   PAIN:  Are you having pain? No  FALLS: Has patient fallen in last 6 months?  No  LIVING ENVIRONMENT: Lives with: lives with their spouse Lives in: House/apartment  PLOF:  Level  of assistance: Independent with ADLs Employment: Retired  PATIENT GOALS    to improve communication in a variety of settings  OBJECTIVE:    TODAY'S TREATMENT:   Reviewed Hospital doctor. Pt educated re: rationale and how to perform ex's. Pt completed ex's with rare cues, significant hoarseness noted.   Conversation: despite cueing, pt with significant hoarseness this date. Pt tearful at times re: CLOF/voice. Supported counseling provided as appropriate.   Pt endorsed completing EMST, 5 sets of 5 breaths daily. Pt to continue for HEP for 5x/week. Pt completed x5 sets of x5 breaths with almost immediate improvement in loudness/vocal quality. Likely due to improved breath support. Discussed performed EMST prior to [facility] ex's to bring awareness to breathing.     PATIENT EDUCATION: Education details: as above Person educated: Patient Education method: Explanation Education comprehension: verbalized understanding and needs further education       HOME EXERCISE PROGRAM:  Pushing/pulling ex's  EMST  Phorte ex's   GOALS: Goals reviewed with patient? Yes  SHORT TERM GOALS: Target date: 10 sessions  Pt will complete pushing/pulling exercises to improve vocal cord adduction x10 reps with no more than min cueing. Baseline: Goal status: INITIAL  2.  The patient will complete HEP (Maximum duration ah, High/Lows, and Functional Phrases) at average loudness >/= 80 dB and with loud, good quality voice without reports of strain. Baseline:  Goal status: INITIAL  3.  The patient will complete Hierarchical Speech Loudness reading drills (words/phrases, sentences) at average >/= 75 dB and with loud, good quality voice without reports of strain. Baseline:  Goal status: INITIAL  4.  The patient will participate in 5-8 minutes conversation, maintaining average loudness of 75 dB and good quality voice with modified independence.  Baseline:  Goal status: INITIAL  5.  Pt will report completing EMST for improve breath support 5x/week (may be completed via combination of at home and in tx sessions).  Baseline:  Goal Status: INITIAL      LONG TERM GOALS: Target date: 12 weeks   The patient will complete HEP (Maximum duration ah, High/Lows, and Functional Phrases) at average loudness >/= 80 dB and with loud, good quality voice indep without reports of strain. Baseline:  Goal status: INITIAL    The patient will participate in 15-20 minutes conversation, maintaining average loudness of 75 dB and good quality voice with modified independence.  Baseline:  Goal status: INITIAL  4. Pt will report improvement in communication per PROM. Baseline:  Goal status: INITIAL  ASSESSMENT:  CLINICAL IMPRESSION: Patient is a 83 y.o. 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 Pt known well to SLP services and received ST 04/13/23-05/27/23. 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 Pt reports periods of aphonia and general decline in voice since ending ST sessions. Pt presents  with s/sx dysphonia and hypophonia. Voice is c/b auditory perceptual characteristics of hoarse/strained, occasionally breathy voice, and periods of aphonia. Pt with tendency to speak on residual capacity and utilize thoracic and clavicular breathing patterns. Pt stimulable to clear speech techniques (e.g. speak loud and slow) as well as pushing/pulling to improve vocal cord adduction. Pt eager to resume ST sessions to improve communication at both home and in the community. See details of today's tx session above. Recommend course of ST with an intensity based approach.  OBJECTIVE IMPAIRMENTS include voice disorder. These impairments are limiting patient from effectively communicating at home and in community. Factors affecting potential  to achieve goals and functional outcome are co-morbidities. Patient will benefit from skilled SLP services to address above impairments and improve overall function.  REHAB POTENTIAL: Good  PLAN: SLP FREQUENCY: 1-2x/week  SLP DURATION: 12 weeks  PLANNED INTERVENTIONS: Environmental controls, Cueing hierachy, Internal/external aids, Functional tasks, Multimodal communication approach, SLP instruction and feedback, Compensatory strategies, and Patient/family education    Delon Bangs, M.S., CCC-SLP Speech-Language Pathologist [place] [facility] [phone] [place])  [place] Outpatient Rehabilitation at [facility] [address] [place], [place], [address] Phone: [phone]   Fax:  [phone]

## 2023-11-03 ENCOUNTER — Encounter: Payer: Self-pay | Admitting: Urology

## 2023-11-03 ENCOUNTER — Ambulatory Visit: Payer: Self-pay | Admitting: Family Medicine

## 2023-11-03 DIAGNOSIS — M25559 Pain in unspecified hip: Secondary | ICD-10-CM | POA: Insufficient documentation

## 2023-11-03 NOTE — Assessment & Plan Note (Signed)
 I question if he has irritation at the iliopsoas or soft tissue nearby.  I do not suspect intrinsic hip joint pathology.  Reasonable to check plain films today. Refer to PT.  Try diclofenac gel as needed, a few times a day.  Try icing and stretching.  Update me as needed.  He agrees to plan.

## 2023-11-04 ENCOUNTER — Ambulatory Visit

## 2023-11-04 DIAGNOSIS — R498 Other voice and resonance disorders: Secondary | ICD-10-CM

## 2023-11-04 DIAGNOSIS — R490 Dysphonia: Secondary | ICD-10-CM

## 2023-11-04 NOTE — Therapy (Signed)
 OUTPATIENT SPEECH LANGUAGE PATHOLOGY VOICE TREATMENT   Patient Name: Norman Mitchell MRN: [id number] DOB:1940/06/24, 83 y.o., male Today's Date: 11/04/2023  PCP: Arlyss Solian, MD  REFERRING PROVIDER: Asberry Schneider, DO   End of Session - 11/04/23 1209     Visit Number 8    Number of Visits 24    Date for SLP Re-Evaluation 12/29/23    SLP Start Time 1103    SLP Stop Time  1150    SLP Time Calculation (min) 47 min    Activity Tolerance Patient tolerated treatment well          Past Medical History:  Diagnosis Date   Arthritis    Hands, knee   Back pain    Complication of anesthesia    Was told after 1 surgery that his HR went really low.  Says his normal HR is 50-55.   DVT (deep venous thrombosis) (HCC) 2017   Bilateral knees.  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   Hypothyroidism    Oral cancer (HCC)    Status post radiation.   Paroxysmal atrial fibrillation (HCC)    Prostatitis    Scoliosis    Thrombocytopenia (HCC)    Venous insufficiency    legs   Past Surgical History:  Procedure Laterality Date   CATARACT EXTRACTION Bilateral    KNEE ARTHROSCOPY     KNEE ARTHROSCOPY WITH MEDIAL MENISECTOMY Right 01/09/2020   Procedure: RIGHT KNEE ARTHROSCOPY WITH MEDIAL MENISECTOMY;  Surgeon: Marchia Drivers, MD;  Location: [facility];  Service: Orthopedics;  Laterality: Right;   Oral biopsy     PILONIDAL CYST EXCISION     PROSTATE BIOPSY     Negative   SEPTOPLASTY     SHOULDER ARTHROSCOPY WITH ROTATOR CUFF REPAIR AND SUBACROMIAL DECOMPRESSION Right 07/02/2020   Procedure: RIGHT SHOULDER ARTHROSCOPY  DISTAL CLAVICLE EXCISION, BICEPS TENODESIS, SUBACROMIAL DECOMPRESSION, ANTERIOR LABRAL REPAIR. AND MINI OPEN ROTATOR CUFF REPAIR;  Surgeon: Krasinski, Kevin, MD;  Location: [facility];  Service: Orthopedics;  Laterality: Right;   TONSILLECTOMY     Patient Active Problem List   Diagnosis Date Noted   Hip pain 11/03/2023   Left sided sciatica 05/26/2023   Change in  stool 04/28/2023   Medicare annual wellness visit, subsequent 11/29/2022   Tremor 11/29/2022   GERD (gastroesophageal reflux disease) 03/15/2022   Irregular heart rhythm 11/05/2021   White coat syndrome with high blood pressure without hypertension 11/05/2021   Paresthesia 11/10/2020   Erectile dysfunction 11/10/2020   PSA elevation 11/10/2020   Osteoarthritis of right knee 06/21/2020   Synovitis of knee 06/21/2020   Partial thickness rotator cuff tear 06/21/2020   Knee pain 11/12/2019   Actinic keratosis 11/12/2019   Scoliosis 01/09/2019   Advance care planning 12/28/2018   Healthcare maintenance 12/28/2018   Venous insufficiency    Thrombocytopenia (HCC)    Prostatitis    Paroxysmal atrial fibrillation (HCC)    Hypothyroidism    History of oropharyngeal cancer 10/03/2018   Essential hypertension 08/12/2015   Palpitations 08/12/2015    ONSET DATE: 09/28/23 (referral date)    REFERRING DIAG: hypophonia  THERAPY DIAG:  Dysphonia  Hypophonia  Rationale for Evaluation and Treatment Rehabilitation  SUBJECTIVE:   SUBJECTIVE STATEMENT: Pt alert, pleasant, and cooperative Pt accompanied by: self  PERTINENT HISTORY & DIAGNOSTIC FINDINGS:  Pt is an 83 y.o. 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 DaTSCAN , 01/12/23, Bilateral mild decreased putamen activity is a pattern suggestive of  Parkinsonian syndrome pathology. Larygoscopy,  05/13/23, Given vocal cords moving but with bowing seen by laryngoscopy, and I suspect it is contributing to the hoarseness and breathiness, I let him know that we could refer him to a laryngologist, he would like to complete speech therapy first to see if laryngology would be necessary. Pt had course of ST 04/13/23-05/27/23 with improved vocal quality.   PAIN:  Are you having pain? No  FALLS: Has patient fallen in last 6 months?  No  LIVING ENVIRONMENT: Lives with: lives with their spouse Lives in:  House/apartment  PLOF:  Level of assistance: Independent with ADLs Employment: Retired  PATIENT GOALS    to improve communication in a variety of settings  OBJECTIVE:    TODAY'S TREATMENT:   Pt endorsed completing EMST, 5 sets of 5 breaths daily. Pt to continue for HEP for 5x/week. Pt completed x5 sets of x5 breaths with almost immediate improvement in loudness/vocal quality. Likely due to improved breath support. Discussed performing EMST prior to [facility] ex's to bring awareness to breathing.  Reviewed Phonation Resistance Training Exercises. Pt completed ex's with rare cues, mild-moderate hoarseness noted which improved following throwing ex's. Pt instructed to throw voice while standing throwing bean bags and saying words/phrases. Marked improvement in loudness and vocal quality during and immediately following ex's.  Evidence of mild volume and vocal quality decay during conversation, but markedly improved from previous session.      PATIENT EDUCATION: Education details: as above Person educated: Patient Education method: Explanation Education comprehension: verbalized understanding and needs further education       HOME EXERCISE PROGRAM:  Pushing/pulling ex's  EMST  Phorte ex's   GOALS: Goals reviewed with patient? Yes  SHORT TERM GOALS: Target date: 10 sessions  Pt will complete pushing/pulling exercises to improve vocal cord adduction x10 reps with no more than min cueing. Baseline: Goal status: INITIAL  2.  The patient will complete HEP (Maximum duration ah, High/Lows, and Functional Phrases) at average loudness >/= 80 dB and with loud, good quality voice without reports of strain. Baseline:  Goal status: INITIAL  3.  The patient will complete Hierarchical Speech Loudness reading drills (words/phrases, sentences) at average >/= 75 dB and with loud, good quality voice without reports of strain. Baseline:  Goal status: INITIAL  4.  The patient will  participate in 5-8 minutes conversation, maintaining average loudness of 75 dB and good quality voice with modified independence.  Baseline:  Goal status: INITIAL  5. Pt will report completing EMST for improve breath support 5x/week (may be completed via combination of at home and in tx sessions).  Baseline:  Goal Status: INITIAL      LONG TERM GOALS: Target date: 12 weeks   The patient will complete HEP (Maximum duration ah, High/Lows, and Functional Phrases) at average loudness >/= 80 dB and with loud, good quality voice indep without reports of strain. Baseline:  Goal status: INITIAL    The patient will participate in 15-20 minutes conversation, maintaining average loudness of 75 dB and good quality voice with modified independence.  Baseline:  Goal status: INITIAL  4. Pt will report improvement in communication per PROM. Baseline:  Goal status: INITIAL  ASSESSMENT:  CLINICAL IMPRESSION: Patient is a 83 y.o. 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 Pt known well to SLP services and received ST 04/13/23-05/27/23. 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 Pt reports periods of aphonia and general decline in voice since ending ST sessions. Pt presents with s/sx dysphonia and hypophonia. Voice is c/b auditory perceptual characteristics of hoarse/strained, occasionally breathy voice, and periods of aphonia. Pt with tendency to speak on residual capacity and utilize thoracic and clavicular breathing patterns. Pt stimulable to clear speech techniques (e.g. speak loud and slow) as well as pushing/pulling to improve vocal cord adduction. Pt eager to resume ST sessions to improve communication at both home and in the community. See details of today's tx session above. Recommend course of ST with an intensity based  approach.  OBJECTIVE IMPAIRMENTS include voice disorder. These impairments are limiting patient from effectively communicating at home and in community. Factors affecting potential to achieve goals and functional outcome are co-morbidities. Patient will benefit from skilled SLP services to address above impairments and improve overall function.  REHAB POTENTIAL: Good  PLAN: SLP FREQUENCY: 1-2x/week  SLP DURATION: 12 weeks  PLANNED INTERVENTIONS: Environmental controls, Cueing hierachy, Internal/external aids, Functional tasks, Multimodal communication approach, SLP instruction and feedback, Compensatory strategies, and Patient/family education    Delon Bangs, M.S., CCC-SLP Speech-Language Pathologist [place] [facility] [phone] [place])  [place] [facility] Out[facility] Rehabilitation at [facility] [address] [place], [place], [address] Phone: [phone]   Fax:  [phone]

## 2023-11-07 ENCOUNTER — Telehealth: Payer: Self-pay | Admitting: Family Medicine

## 2023-11-07 NOTE — Telephone Encounter (Signed)
 See MyChart message regarding x-ray of hip.

## 2023-11-09 ENCOUNTER — Encounter: Payer: Self-pay | Admitting: Family Medicine

## 2023-11-09 ENCOUNTER — Ambulatory Visit (INDEPENDENT_AMBULATORY_CARE_PROVIDER_SITE_OTHER): Payer: Medicare Other | Admitting: Family Medicine

## 2023-11-09 ENCOUNTER — Ambulatory Visit

## 2023-11-09 VITALS — BP 134/60 | HR 63 | Temp 97.8°F | Ht 70.0 in | Wt 172.0 lb

## 2023-11-09 DIAGNOSIS — N529 Male erectile dysfunction, unspecified: Secondary | ICD-10-CM

## 2023-11-09 DIAGNOSIS — K429 Umbilical hernia without obstruction or gangrene: Secondary | ICD-10-CM | POA: Diagnosis not present

## 2023-11-09 DIAGNOSIS — Z0000 Encounter for general adult medical examination without abnormal findings: Secondary | ICD-10-CM

## 2023-11-09 DIAGNOSIS — Z85819 Personal history of malignant neoplasm of unspecified site of lip, oral cavity, and pharynx: Secondary | ICD-10-CM

## 2023-11-09 DIAGNOSIS — Z7189 Other specified counseling: Secondary | ICD-10-CM

## 2023-11-09 DIAGNOSIS — M25559 Pain in unspecified hip: Secondary | ICD-10-CM

## 2023-11-09 DIAGNOSIS — R498 Other voice and resonance disorders: Secondary | ICD-10-CM

## 2023-11-09 DIAGNOSIS — R490 Dysphonia: Secondary | ICD-10-CM

## 2023-11-09 DIAGNOSIS — R29818 Other symptoms and signs involving the nervous system: Secondary | ICD-10-CM

## 2023-11-09 DIAGNOSIS — D696 Thrombocytopenia, unspecified: Secondary | ICD-10-CM

## 2023-11-09 DIAGNOSIS — E039 Hypothyroidism, unspecified: Secondary | ICD-10-CM | POA: Diagnosis not present

## 2023-11-09 DIAGNOSIS — R972 Elevated prostate specific antigen [PSA]: Secondary | ICD-10-CM

## 2023-11-09 MED ORDER — LEVOTHYROXINE SODIUM 88 MCG PO TABS: 88.0000 ug | ORAL_TABLET | Freq: Every day | ORAL | 3 refills | Status: AC

## 2023-11-09 NOTE — Assessment & Plan Note (Signed)
 With prev neg skin biopsy

## 2023-11-09 NOTE — Progress Notes (Unsigned)
 His sx improved in the meantime, with no sx now, but had discomfort with walking yesterday.  Taking diclofenac prn.  That helps some.  I asked him to let me know if he can't get set up with PT.    Miralax  helped, using every other day.    He smells occ detergent, h/o smell changes after prev cancer tx. D/w pt.   D/w pt about umbilical hernia and seeing general surgery.    Tylenol  at night helps with neuropathy sx at night.    Hypothyroidism.  Compliant.  No ADE on med.  Labs d/w pt.  No neck mass.    ED some better with sildenafil .  No ADE on med.  No NTG use.    3mg  melatonin helped with sleep.    Mildly low PLT, stable, not bleeding.   H/o oral cancer.  No new lesions.    LUTS may have improved with alfuzosin - he didn't want to quit med for comparison.  Some slower stream initiation at night.    Flu shot to be done at [facility].   Tdap 2020 Covid booster d/w pt.  PNA up to date Shingles shot up to date.   RSV prev done.  Wife designated if patient were incapacitated. PSA minimally elevated off finasteride 2025 but improved from 2024 Colonoscopy done at age 8.  Diet and exercise d/w pt.  Doing well with both.   [facility]    Meds, vitals, and allergies reviewed.   ROS: Per HPI unless specifically indicated in ROS section

## 2023-11-09 NOTE — Patient Instructions (Addendum)
 Flu shot when possible this fall.  Take care.  Glad to see you. Update me as needed.  Let me know if you can't get set up with PT and general surgery

## 2023-11-09 NOTE — Therapy (Signed)
 OUTPATIENT SPEECH LANGUAGE PATHOLOGY VOICE TREATMENT   Patient Name: Norman Mitchell MRN: [id number] DOB:05/18/1940, 83 y.o., male Today's Date: 11/09/2023  PCP: Arlyss Solian, MD  REFERRING PROVIDER: Asberry Schneider, DO   End of Session - 11/09/23 1627     Visit Number 9    Number of Visits 24    Date for SLP Re-Evaluation 12/29/23    SLP Start Time 1540    SLP Stop Time  1625    SLP Time Calculation (min) 45 min    Activity Tolerance Patient tolerated treatment well          Past Medical History:  Diagnosis Date   Arthritis    Hands, knee   Back pain    Complication of anesthesia    Was told after 1 surgery that his HR went really low.  Says his normal HR is 50-55.   DVT (deep venous thrombosis) (HCC) 2017   Bilateral knees.  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   Hypothyroidism    Oral cancer (HCC)    Status post radiation.   Paroxysmal atrial fibrillation (HCC)    Prostatitis    Scoliosis    Thrombocytopenia (HCC)    Venous insufficiency    legs   Past Surgical History:  Procedure Laterality Date   CATARACT EXTRACTION Bilateral    KNEE ARTHROSCOPY     KNEE ARTHROSCOPY WITH MEDIAL MENISECTOMY Right 01/09/2020   Procedure: RIGHT KNEE ARTHROSCOPY WITH MEDIAL MENISECTOMY;  Surgeon: Marchia Drivers, MD;  Location: [facility];  Service: Orthopedics;  Laterality: Right;   Oral biopsy     PILONIDAL CYST EXCISION     PROSTATE BIOPSY     Negative   SEPTOPLASTY     SHOULDER ARTHROSCOPY WITH ROTATOR CUFF REPAIR AND SUBACROMIAL DECOMPRESSION Right 07/02/2020   Procedure: RIGHT SHOULDER ARTHROSCOPY  DISTAL CLAVICLE EXCISION, BICEPS TENODESIS, SUBACROMIAL DECOMPRESSION, ANTERIOR LABRAL REPAIR. AND MINI OPEN ROTATOR CUFF REPAIR;  Surgeon: Krasinski, Kevin, MD;  Location: [facility];  Service: Orthopedics;  Laterality: Right;   TONSILLECTOMY     Patient Active Problem List   Diagnosis Date Noted   Parkinsonian features 11/09/2023   Hip pain 11/03/2023   Left sided  sciatica 05/26/2023   Change in stool 04/28/2023   Medicare annual wellness visit, subsequent 11/29/2022   Tremor 11/29/2022   GERD (gastroesophageal reflux disease) 03/15/2022   Irregular heart rhythm 11/05/2021   White coat syndrome with high blood pressure without hypertension 11/05/2021   Paresthesia 11/10/2020   Erectile dysfunction 11/10/2020   PSA elevation 11/10/2020   Osteoarthritis of right knee 06/21/2020   Synovitis of knee 06/21/2020   Partial thickness rotator cuff tear 06/21/2020   Knee pain 11/12/2019   Actinic keratosis 11/12/2019   Scoliosis 01/09/2019   Advance care planning 12/28/2018   Healthcare maintenance 12/28/2018   Venous insufficiency    Thrombocytopenia (HCC)    Prostatitis    Paroxysmal atrial fibrillation (HCC)    Hypothyroidism    History of oropharyngeal cancer 10/03/2018   Essential hypertension 08/12/2015   Palpitations 08/12/2015    ONSET DATE: 09/28/23 (referral date)    REFERRING DIAG: hypophonia  THERAPY DIAG:  Dysphonia  Hypophonia  Rationale for Evaluation and Treatment Rehabilitation  SUBJECTIVE:   SUBJECTIVE STATEMENT: Pt alert, pleasant, and cooperative Pt accompanied by: self  PERTINENT HISTORY & DIAGNOSTIC FINDINGS:  Pt is an 83 y.o. 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 DaTSCAN , 01/12/23, Bilateral mild decreased putamen activity is a pattern suggestive of  Parkinsonian syndrome pathology. Larygoscopy, 05/13/23, Given vocal cords moving but with bowing seen by laryngoscopy, and I suspect it is contributing to the hoarseness and breathiness, I let him know that we could refer him to a laryngologist, he would like to complete speech therapy first to see if laryngology would be necessary. Pt had course of ST 04/13/23-05/27/23 with improved vocal quality.   PAIN:  Are you having pain? No  FALLS: Has patient fallen in last 6 months?  No  LIVING ENVIRONMENT: Lives with: lives with their  spouse Lives in: House/apartment  PLOF:  Level of assistance: Independent with ADLs Employment: Retired  PATIENT GOALS    to improve communication in a variety of settings  OBJECTIVE:    TODAY'S TREATMENT:   Pt endorsed completing EMST, 5 sets of 5 breaths daily. Pt to continue for HEP for 5x/week. Pt completed x5 sets of x5 breaths with almost immediate improvement in loudness/vocal quality. Likely due to improved breath support. Discussed performing EMST prior to [facility] ex's to bring awareness to breathing.  Reviewed Phonation Resistance Training Exercises. Pt completed ex's with rare cues, mild-moderate hoarseness noted which improved following throwing ex's. Pt instructed to throw voice while standing throwing bean bags and saying words/phrases. Marked improvement in loudness and vocal quality during and immediately following ex's.  Evidence of mild volume and vocal quality decay during conversation; however, improved from previous sessions. Education provided re: posture and role in breath support/voicing. Will continue to address in upcoming sessions.       PATIENT EDUCATION: Education details: as above Person educated: Patient Education method: Explanation Education comprehension: verbalized understanding and needs further education       HOME EXERCISE PROGRAM:  Pushing/pulling ex's  EMST  Phorte ex's   GOALS: Goals reviewed with patient? Yes  SHORT TERM GOALS: Target date: 10 sessions  Pt will complete pushing/pulling exercises to improve vocal cord adduction x10 reps with no more than min cueing. Baseline: Goal status: INITIAL  2.  The patient will complete HEP (Maximum duration ah, High/Lows, and Functional Phrases) at average loudness >/= 80 dB and with loud, good quality voice without reports of strain. Baseline:  Goal status: INITIAL  3.  The patient will complete Hierarchical Speech Loudness reading drills (words/phrases, sentences) at average >/= 75  dB and with loud, good quality voice without reports of strain. Baseline:  Goal status: INITIAL  4.  The patient will participate in 5-8 minutes conversation, maintaining average loudness of 75 dB and good quality voice with modified independence.  Baseline:  Goal status: INITIAL  5. Pt will report completing EMST for improve breath support 5x/week (may be completed via combination of at home and in tx sessions).  Baseline:  Goal Status: INITIAL      LONG TERM GOALS: Target date: 12 weeks   The patient will complete HEP (Maximum duration ah, High/Lows, and Functional Phrases) at average loudness >/= 80 dB and with loud, good quality voice indep without reports of strain. Baseline:  Goal status: INITIAL    The patient will participate in 15-20 minutes conversation, maintaining average loudness of 75 dB and good quality voice with modified independence.  Baseline:  Goal status: INITIAL  4. Pt will report improvement in communication per PROM. Baseline:  Goal status: INITIAL  ASSESSMENT:  CLINICAL IMPRESSION: Patient is a 83 y.o. 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 Pt known well to SLP services and  received ST 04/13/23-05/27/23. 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 Pt reports periods of aphonia and general decline in voice since ending ST sessions. Pt presents with s/sx dysphonia and hypophonia. Voice is c/b auditory perceptual characteristics of hoarse/strained, occasionally breathy voice, and periods of aphonia. Pt with tendency to speak on residual capacity and utilize thoracic and clavicular breathing patterns. Pt stimulable to clear speech techniques (e.g. speak loud and slow) as well as pushing/pulling to improve vocal cord adduction. Pt eager to resume ST sessions to improve communication at both home  and in the community. See details of today's tx session above. Recommend course of ST with an intensity based approach.  OBJECTIVE IMPAIRMENTS include voice disorder. These impairments are limiting patient from effectively communicating at home and in community. Factors affecting potential to achieve goals and functional outcome are co-morbidities. Patient will benefit from skilled SLP services to address above impairments and improve overall function.  REHAB POTENTIAL: Good  PLAN: SLP FREQUENCY: 1-2x/week  SLP DURATION: 12 weeks  PLANNED INTERVENTIONS: Environmental controls, Cueing hierachy, Internal/external aids, Functional tasks, Multimodal communication approach, SLP instruction and feedback, Compensatory strategies, and Patient/family education    Delon Bangs, M.S., CCC-SLP Speech-Language Pathologist [employer] [facility] [phone] [place])  [place] [facility] Outpatient Rehabilitation at [facility] [address] [place], [place], [address] Phone: [phone]   Fax:  [phone]

## 2023-11-10 DIAGNOSIS — K429 Umbilical hernia without obstruction or gangrene: Secondary | ICD-10-CM | POA: Insufficient documentation

## 2023-11-10 NOTE — Assessment & Plan Note (Signed)
 Soft, okay for outpatient follow-up.  Referred to general surgery.

## 2023-11-10 NOTE — Assessment & Plan Note (Signed)
 He has noted occasional abnormal scents, ie can smell detergent, occasional.  Noted since previous treatment.  No new lesions on oral exam.  I asked him to update me as needed.

## 2023-11-10 NOTE — Assessment & Plan Note (Signed)
 Similar to prior.  No bleeding.  Routine cautions given to patient.

## 2023-11-10 NOTE — Assessment & Plan Note (Signed)
 Compliant.  No ADE on med.  Labs d/w pt.  No neck mass.   Continue levothyroxine  as is.

## 2023-11-10 NOTE — Assessment & Plan Note (Signed)
 Versus leg pain.  I asked him to let me know if he cannot get set up with PT or if that does not help.  He agreed.

## 2023-11-10 NOTE — Assessment & Plan Note (Signed)
 Wife designated if patient were incapacitated.

## 2023-11-10 NOTE — Assessment & Plan Note (Signed)
 LUTS may have improved with alfuzosin - he didn't want to quit med for comparison.  Some slower stream initiation at night.  PSA minimally elevated off finasteride 2025 but improved from 2024  I would continue as is with alfuzosin  for now.

## 2023-11-10 NOTE — Assessment & Plan Note (Signed)
 Flu shot to be done at [facility].   Tdap 2020 Covid booster d/w pt.  PNA up to date Shingles shot up to date.   RSV prev done.  Wife designated if patient were incapacitated. PSA minimally elevated off finasteride 2025 but improved from 2024 Colonoscopy done at age 83.  Diet and exercise d/w pt.  Doing well with both.   [facility]

## 2023-11-10 NOTE — Assessment & Plan Note (Signed)
 ED some better with sildenafil .  No ADE on med.  No NTG use.   Continue sildenafil  as is.

## 2023-11-11 ENCOUNTER — Encounter: Payer: Self-pay | Admitting: Family Medicine

## 2023-11-11 ENCOUNTER — Ambulatory Visit

## 2023-11-11 DIAGNOSIS — R490 Dysphonia: Secondary | ICD-10-CM | POA: Diagnosis not present

## 2023-11-11 DIAGNOSIS — R498 Other voice and resonance disorders: Secondary | ICD-10-CM

## 2023-11-11 NOTE — Telephone Encounter (Signed)
 Please check the status of this referral please to physical therapy

## 2023-11-11 NOTE — Therapy (Signed)
 OUTPATIENT SPEECH LANGUAGE PATHOLOGY VOICE TREATMENT   Patient Name: Norman Mitchell MRN: [id number] DOB:December 01, 1940, 83 y.o., male Today's Date: 11/11/2023  PCP: Arlyss Solian, MD  REFERRING PROVIDER: Asberry Schneider, DO  Speech Therapy Progress Note  Dates of Reporting Period: 10/06/23 to 11/11/23  Objective: Patient has been seen for 10 speech therapy sessions this reporting period targeting dysphonia and hypophonia. Patient is making progress toward LTGs and met 3/5 STGs this reporting period. See skilled intervention, clinical impressions, and goals below for details.    End of Session - 11/11/23 0919     Visit Number 10    Number of Visits 24    Date for SLP Re-Evaluation 12/29/23    SLP Start Time 0925    SLP Stop Time  1010    SLP Time Calculation (min) 45 min    Activity Tolerance Patient tolerated treatment well          Past Medical History:  Diagnosis Date   Arthritis    Hands, knee   Back pain    Complication of anesthesia    Was told after 1 surgery that his HR went really low.  Says his normal HR is 50-55.   DVT (deep venous thrombosis) (HCC) 2017   Bilateral knees.  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   Hypothyroidism    Oral cancer (HCC)    Status post radiation.   Paroxysmal atrial fibrillation (HCC)    Prostatitis    Scoliosis    Thrombocytopenia (HCC)    Venous insufficiency    legs   Past Surgical History:  Procedure Laterality Date   CATARACT EXTRACTION Bilateral    KNEE ARTHROSCOPY     KNEE ARTHROSCOPY WITH MEDIAL MENISECTOMY Right 01/09/2020   Procedure: RIGHT KNEE ARTHROSCOPY WITH MEDIAL MENISECTOMY;  Surgeon: Marchia Drivers, MD;  Location: [facility];  Service: Orthopedics;  Laterality: Right;   Oral biopsy     PILONIDAL CYST EXCISION     PROSTATE BIOPSY     Negative   SEPTOPLASTY     SHOULDER ARTHROSCOPY WITH ROTATOR CUFF REPAIR AND SUBACROMIAL DECOMPRESSION Right 07/02/2020   Procedure: RIGHT SHOULDER ARTHROSCOPY  DISTAL CLAVICLE  EXCISION, BICEPS TENODESIS, SUBACROMIAL DECOMPRESSION, ANTERIOR LABRAL REPAIR. AND MINI OPEN ROTATOR CUFF REPAIR;  Surgeon: Krasinski, Kevin, MD;  Location: [facility];  Service: Orthopedics;  Laterality: Right;   TONSILLECTOMY     Patient Active Problem List   Diagnosis Date Noted   Umbilical hernia without obstruction and without gangrene 11/10/2023   Parkinsonian features 11/09/2023   Hip pain 11/03/2023   Left sided sciatica 05/26/2023   Change in stool 04/28/2023   Medicare annual wellness visit, subsequent 11/29/2022   Tremor 11/29/2022   GERD (gastroesophageal reflux disease) 03/15/2022   Irregular heart rhythm 11/05/2021   White coat syndrome with high blood pressure without hypertension 11/05/2021   Paresthesia 11/10/2020   Erectile dysfunction 11/10/2020   PSA elevation 11/10/2020   Osteoarthritis of right knee 06/21/2020   Synovitis of knee 06/21/2020   Partial thickness rotator cuff tear 06/21/2020   Knee pain 11/12/2019   Actinic keratosis 11/12/2019   Scoliosis 01/09/2019   Advance care planning 12/28/2018   Healthcare maintenance 12/28/2018   Venous insufficiency    Thrombocytopenia (HCC)    Prostatitis    Paroxysmal atrial fibrillation (HCC)    Hypothyroidism    History of oropharyngeal cancer 10/03/2018   Essential hypertension 08/12/2015   Palpitations 08/12/2015    ONSET DATE: 09/28/23 (referral date)    REFERRING DIAG: hypophonia  THERAPY DIAG:  Hypophonia  Dysphonia  Rationale for Evaluation and Treatment Rehabilitation  SUBJECTIVE:   SUBJECTIVE STATEMENT: Pt alert, pleasant, and cooperative Pt accompanied by: self  PERTINENT HISTORY & DIAGNOSTIC FINDINGS:  Pt is an 83 y.o. 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 DaTSCAN , 01/12/23, Bilateral mild decreased putamen activity is a pattern suggestive of  Parkinsonian syndrome pathology. Larygoscopy, 05/13/23, Given vocal cords moving but with bowing  seen by laryngoscopy, and I suspect it is contributing to the hoarseness and breathiness, I let him know that we could refer him to a laryngologist, he would like to complete speech therapy first to see if laryngology would be necessary. Pt had course of ST 04/13/23-05/27/23 with improved vocal quality.   PAIN:  Are you having pain? No  FALLS: Has patient fallen in last 6 months?  No  LIVING ENVIRONMENT: Lives with: lives with their spouse Lives in: House/apartment  PLOF:  Level of assistance: Independent with ADLs Employment: Retired  PATIENT GOALS    to improve communication in a variety of settings  OBJECTIVE:    TODAY'S TREATMENT:   Pt endorsed completing EMST, 5 sets of 5 breaths daily. Pt to continue for HEP for 5x/week. Pt completed x5 sets of x5 breaths with almost immediate improvement in loudness/vocal quality. Likely due to improved breath support. Discussed performing EMST prior to [facility] ex's to bring awareness to breathing.  Reviewed Phonation Resistance Training Exercises. Pt completed ex's with rare cues, mild-moderate hoarseness noted which improved following throwing ex's. Pt instructed to throw voice while standing throwing bean bags and saying words/phrases. Marked improvement in loudness and vocal quality during and immediately following ex's.  Evidence of mild volume and vocal quality decay during conversation continues. Education provided re: posture and role in breath support/voicing. Postural improvements noted with pt sitting backward on a chair.      PATIENT EDUCATION: Education details: as above Person educated: Patient Education method: Explanation Education comprehension: verbalized understanding and needs further education       HOME EXERCISE PROGRAM:  Pushing/pulling ex's  EMST  Phorte ex's   GOALS: Goals reviewed with patient? Yes  SHORT TERM GOALS: Target date: 10 sessions  Pt will complete pushing/pulling exercises to improve vocal  cord adduction x10 reps with no more than min cueing. Baseline: Goal status: MET  2.  The patient will complete HEP (Maximum duration ah, High/Lows, and Functional Phrases) at average loudness >/= 80 dB and with loud, good quality voice without reports of strain. Baseline:  Goal status: PROGRESSING  3.  The patient will complete Hierarchical Speech Loudness reading drills (words/phrases, sentences) at average >/= 75 dB and with loud, good quality voice without reports of strain. Baseline:  Goal status: MET  4.  The patient will participate in 5-8 minutes conversation, maintaining average loudness of 75 dB and good quality voice with modified independence.  Baseline:  Goal status: PROGRESSING  5. Pt will report completing EMST for improve breath support 5x/week (may be completed via combination of at home and in tx sessions).  Baseline:  Goal Status: MET      LONG TERM GOALS: Target date: 12 weeks   The patient will complete HEP (Maximum duration ah, High/Lows, and Functional Phrases) at average loudness >/= 80 dB and with loud, good quality voice indep without reports of strain. Baseline:  Goal status: PROGRESSING    The patient will participate in 15-20 minutes conversation, maintaining average loudness of 75 dB and good quality  voice with modified independence.  Baseline:  Goal status: PROGRESSING  4. Pt will report improvement in communication per PROM. Baseline:  Goal status: PROGRESSING  ASSESSMENT:  CLINICAL IMPRESSION: Patient is a 83 y.o. 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 Pt known well to SLP services and received ST 04/13/23-05/27/23. 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 Pt reports periods of aphonia and general decline in voice since ending ST sessions. Pt presents with s/sx dysphonia  and hypophonia. Voice is c/b auditory perceptual characteristics of hoarse/strained, occasionally breathy voice, and periods of aphonia. Pt with tendency to speak on residual capacity and utilize thoracic and clavicular breathing patterns. Pt stimulable to clear speech techniques (e.g. speak loud and slow) as well as pushing/pulling to improve vocal cord adduction. Pt eager to resume ST sessions to improve communication at both home and in the community. See details of today's tx session above. Recommend course of ST with an intensity based approach.  OBJECTIVE IMPAIRMENTS include voice disorder. These impairments are limiting patient from effectively communicating at home and in community. Factors affecting potential to achieve goals and functional outcome are co-morbidities. Patient will benefit from skilled SLP services to address above impairments and improve overall function.  REHAB POTENTIAL: Good  PLAN: SLP FREQUENCY: 1-2x/week  SLP DURATION: 12 weeks  PLANNED INTERVENTIONS: Environmental controls, Cueing hierachy, Internal/external aids, Functional tasks, Multimodal communication approach, SLP instruction and feedback, Compensatory strategies, and Patient/family education    Norman Mitchell, M.S., CCC-SLP Speech-Language Pathologist [place] [facility] [phone] [place])  [facility] at [facility] [address] [place], [place], [address] Phone: [phone]   Fax:  [phone]

## 2023-11-14 ENCOUNTER — Ambulatory Visit: Payer: Self-pay | Admitting: Family Medicine

## 2023-11-15 ENCOUNTER — Ambulatory Visit

## 2023-11-15 ENCOUNTER — Ambulatory Visit: Payer: Self-pay | Admitting: Surgery

## 2023-11-15 DIAGNOSIS — R490 Dysphonia: Secondary | ICD-10-CM

## 2023-11-15 DIAGNOSIS — R498 Other voice and resonance disorders: Secondary | ICD-10-CM

## 2023-11-15 NOTE — Therapy (Signed)
 OUTPATIENT SPEECH LANGUAGE PATHOLOGY VOICE TREATMENT   Patient Name: Norman Mitchell MRN: [id number] DOB:[birth year], 83 y.o., male Today's Date: 11/15/2023  PCP: Arlyss Solian, MD  REFERRING PROVIDER: Asberry Schneider, DO   End of Session - 11/15/23 1538     Visit Number 11    Number of Visits 24    Date for SLP Re-Evaluation 12/29/23    SLP Start Time 1445    SLP Stop Time  1530    SLP Time Calculation (min) 45 min    Activity Tolerance Patient tolerated treatment well;Patient limited by fatigue          Past Medical History:  Diagnosis Date   Arthritis    Hands, knee   Back pain    Complication of anesthesia    Was told after 1 surgery that his HR went really low.  Says his normal HR is 50-55.   DVT (deep venous thrombosis) (HCC) 2017   Bilateral knees.  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   Hypothyroidism    Oral cancer (HCC)    Status post radiation.   Paroxysmal atrial fibrillation (HCC)    Prostatitis    Scoliosis    Thrombocytopenia (HCC)    Venous insufficiency    legs   Past Surgical History:  Procedure Laterality Date   CATARACT EXTRACTION Bilateral    KNEE ARTHROSCOPY     KNEE ARTHROSCOPY WITH MEDIAL MENISECTOMY Right 01/09/2020   Procedure: RIGHT KNEE ARTHROSCOPY WITH MEDIAL MENISECTOMY;  Surgeon: Marchia Drivers, MD;  Location: [facility];  Service: Orthopedics;  Laterality: Right;   Oral biopsy     PILONIDAL CYST EXCISION     PROSTATE BIOPSY     Negative   SEPTOPLASTY     SHOULDER ARTHROSCOPY WITH ROTATOR CUFF REPAIR AND SUBACROMIAL DECOMPRESSION Right 07/02/2020   Procedure: RIGHT SHOULDER ARTHROSCOPY  DISTAL CLAVICLE EXCISION, BICEPS TENODESIS, SUBACROMIAL DECOMPRESSION, ANTERIOR LABRAL REPAIR. AND MINI OPEN ROTATOR CUFF REPAIR;  Surgeon: Krasinski, Kevin, MD;  Location: [facility];  Service: Orthopedics;  Laterality: Right;   TONSILLECTOMY     Patient Active Problem List   Diagnosis Date Noted   Umbilical hernia without obstruction and  without gangrene 11/10/2023   Parkinsonian features 11/09/2023   Hip pain 11/03/2023   Left sided sciatica 05/26/2023   Change in stool 04/28/2023   Medicare annual wellness visit, subsequent 11/29/2022   Tremor 11/29/2022   GERD (gastroesophageal reflux disease) 03/15/2022   Irregular heart rhythm 11/05/2021   White coat syndrome with high blood pressure without hypertension 11/05/2021   Paresthesia 11/10/2020   Erectile dysfunction 11/10/2020   PSA elevation 11/10/2020   Osteoarthritis of right knee 06/21/2020   Synovitis of knee 06/21/2020   Partial thickness rotator cuff tear 06/21/2020   Knee pain 11/12/2019   Actinic keratosis 11/12/2019   Scoliosis 01/09/2019   Advance care planning 12/28/2018   Healthcare maintenance 12/28/2018   Venous insufficiency    Thrombocytopenia (HCC)    Prostatitis    Paroxysmal atrial fibrillation (HCC)    Hypothyroidism    History of oropharyngeal cancer 10/03/2018   Essential hypertension 08/12/2015   Palpitations 08/12/2015    ONSET DATE: 09/28/23 (referral date)    REFERRING DIAG: hypophonia  THERAPY DIAG:  Hypophonia  Dysphonia  Rationale for Evaluation and Treatment Rehabilitation  SUBJECTIVE:   SUBJECTIVE STATEMENT: Pt alert, pleasant, and cooperative Pt accompanied by: self  PERTINENT HISTORY & DIAGNOSTIC FINDINGS:  Pt is an 83 y.o. male who presents today for a voice evaluation in setting of  Parkinsonism. DaTSCAN , 01/12/23, Bilateral mild decreased putamen activity is a pattern suggestive of  Parkinsonian syndrome pathology. Larygoscopy, 05/13/23, Given vocal cords moving but with bowing seen by laryngoscopy, and I suspect it is contributing to the hoarseness and breathiness, I let him know that we could refer him to a laryngologist, he would like to complete speech therapy first to see if laryngology would be necessary. Pt had course of ST 04/13/23-05/27/23 with improved vocal quality.   PAIN:  Are you having pain?  No  FALLS: Has patient fallen in last 6 months?  No  LIVING ENVIRONMENT: Lives with: lives with their spouse Lives in: House/apartment  PLOF:  Level of assistance: Independent with ADLs Employment: Retired  PATIENT GOALS    to improve communication in a variety of settings  OBJECTIVE:    TODAY'S TREATMENT:   Pt endorsed completing EMST, 5 sets of 5 breaths daily. Pt to continue for HEP for 5x/week. Pt completed x5 sets of x5 breaths with almost immediate improvement in loudness/vocal quality. Likely due to improved breath support. Rest breaks needed and several repeat repetitions likely due to fatigue.   Discussed performing EMST prior to [facility] ex's to bring awareness to breathing.  Reviewed Phonation Resistance Training Exercises.Did not complete; however, throwing ex's completed. Pt instructed to throw voice while standing throwing bean bags and saying words/phrases. Marked improvement in loudness and vocal quality during and immediately following ex's.  Evidence of mild volume and vocal quality decay during conversation continues. Education provided re: posture and role in breath support/voicing.       PATIENT EDUCATION: Education details: as above Person educated: Patient Education method: Explanation Education comprehension: verbalized understanding and needs further education       HOME EXERCISE PROGRAM:  Pushing/pulling ex's  EMST  Phorte ex's   GOALS: Goals reviewed with patient? Yes  SHORT TERM GOALS: Target date: 10 sessions  Pt will complete pushing/pulling exercises to improve vocal cord adduction x10 reps with no more than min cueing. Baseline: Goal status: MET  2.  The patient will complete HEP (Maximum duration ah, High/Lows, and Functional Phrases) at average loudness >/= 80 dB and with loud, good quality voice without reports of strain. Baseline:  Goal status: PROGRESSING  3.  The patient will complete Hierarchical Speech Loudness reading  drills (words/phrases, sentences) at average >/= 75 dB and with loud, good quality voice without reports of strain. Baseline:  Goal status: MET  4.  The patient will participate in 5-8 minutes conversation, maintaining average loudness of 75 dB and good quality voice with modified independence.  Baseline:  Goal status: PROGRESSING  5. Pt will report completing EMST for improve breath support 5x/week (may be completed via combination of at home and in tx sessions).  Baseline:  Goal Status: MET      LONG TERM GOALS: Target date: 12 weeks   The patient will complete HEP (Maximum duration ah, High/Lows, and Functional Phrases) at average loudness >/= 80 dB and with loud, good quality voice indep without reports of strain. Baseline:  Goal status: PROGRESSING    The patient will participate in 15-20 minutes conversation, maintaining average loudness of 75 dB and good quality voice with modified independence.  Baseline:  Goal status: PROGRESSING  4. Pt will report improvement in communication per PROM. Baseline:  Goal status: PROGRESSING  ASSESSMENT:  CLINICAL IMPRESSION: Patient is a 83 y.o. 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 Pt known well to SLP services and received ST 04/13/23-05/27/23. 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 Pt reports periods of aphonia and general decline in voice since ending ST sessions. Pt presents with s/sx dysphonia and hypophonia. Voice is c/b auditory perceptual characteristics of hoarse/strained, occasionally breathy voice, and periods of aphonia. Pt with tendency to speak on residual capacity and utilize thoracic and clavicular breathing patterns. Pt stimulable to clear speech techniques (e.g. speak loud and slow) as well as pushing/pulling to improve vocal cord adduction. Pt eager  to resume ST sessions to improve communication at both home and in the community. See details of today's tx session above. Recommend course of ST with an intensity based approach.  OBJECTIVE IMPAIRMENTS include voice disorder. These impairments are limiting patient from effectively communicating at home and in community. Factors affecting potential to achieve goals and functional outcome are co-morbidities. Patient will benefit from skilled SLP services to address above impairments and improve overall function.  REHAB POTENTIAL: Good  PLAN: SLP FREQUENCY: 1-2x/week  SLP DURATION: 12 weeks  PLANNED INTERVENTIONS: Environmental controls, Cueing hierachy, Internal/external aids, Functional tasks, Multimodal communication approach, SLP instruction and feedback, Compensatory strategies, and Patient/family education    Delon Bangs, M.S., CCC-SLP Speech-Language Pathologist [place] - [facility] [phone] [place])  [facility] at [facility] [address] [place], [place], [address] Phone: [phone]   Fax:  [phone]

## 2023-11-16 ENCOUNTER — Ambulatory Visit

## 2023-11-16 ENCOUNTER — Encounter: Payer: Self-pay | Admitting: Family Medicine

## 2023-11-18 ENCOUNTER — Ambulatory Visit

## 2023-11-22 ENCOUNTER — Ambulatory Visit

## 2023-11-22 DIAGNOSIS — R498 Other voice and resonance disorders: Secondary | ICD-10-CM

## 2023-11-22 DIAGNOSIS — R490 Dysphonia: Secondary | ICD-10-CM | POA: Diagnosis not present

## 2023-11-22 NOTE — Therapy (Signed)
 OUTPATIENT SPEECH LANGUAGE PATHOLOGY VOICE TREATMENT   Patient Name: Norman Mitchell MRN: [id number] DOB:1940-06-08, 83 y.o., male Today's Date: 11/22/2023  PCP: Arlyss Solian, MD  REFERRING PROVIDER: Asberry Schneider, DO   End of Session - 11/22/23 1541     Visit Number 12    Number of Visits 24    Date for Recertification  12/29/23    SLP Start Time 1445    SLP Stop Time  1535    SLP Time Calculation (min) 50 min    Activity Tolerance Patient tolerated treatment well          Past Medical History:  Diagnosis Date   Arthritis    Hands, knee   Back pain    Complication of anesthesia    Was told after 1 surgery that his HR went really low.  Says his normal HR is 50-55.   DVT (deep venous thrombosis) (HCC) 2017   Bilateral knees.  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   Hypothyroidism    Oral cancer (HCC)    Status post radiation.   Paroxysmal atrial fibrillation (HCC)    Prostatitis    Scoliosis    Thrombocytopenia    Venous insufficiency    legs   Past Surgical History:  Procedure Laterality Date   CATARACT EXTRACTION Bilateral    KNEE ARTHROSCOPY     KNEE ARTHROSCOPY WITH MEDIAL MENISECTOMY Right 01/09/2020   Procedure: RIGHT KNEE ARTHROSCOPY WITH MEDIAL MENISECTOMY;  Surgeon: Marchia Drivers, MD;  Location: [facility];  Service: Orthopedics;  Laterality: Right;   Oral biopsy     PILONIDAL CYST EXCISION     PROSTATE BIOPSY     Negative   SEPTOPLASTY     SHOULDER ARTHROSCOPY WITH ROTATOR CUFF REPAIR AND SUBACROMIAL DECOMPRESSION Right 07/02/2020   Procedure: RIGHT SHOULDER ARTHROSCOPY  DISTAL CLAVICLE EXCISION, BICEPS TENODESIS, SUBACROMIAL DECOMPRESSION, ANTERIOR LABRAL REPAIR. AND MINI OPEN ROTATOR CUFF REPAIR;  Surgeon: Krasinski, Kevin, MD;  Location: [facility];  Service: Orthopedics;  Laterality: Right;   TONSILLECTOMY     Patient Active Problem List   Diagnosis Date Noted   Umbilical hernia without obstruction and without gangrene 11/10/2023    Parkinsonian features 11/09/2023   Hip pain 11/03/2023   Left sided sciatica 05/26/2023   Change in stool 04/28/2023   Medicare annual wellness visit, subsequent 11/29/2022   Tremor 11/29/2022   GERD (gastroesophageal reflux disease) 03/15/2022   Irregular heart rhythm 11/05/2021   White coat syndrome with high blood pressure without hypertension 11/05/2021   Paresthesia 11/10/2020   Erectile dysfunction 11/10/2020   PSA elevation 11/10/2020   Osteoarthritis of right knee 06/21/2020   Synovitis of knee 06/21/2020   Partial thickness rotator cuff tear 06/21/2020   Knee pain 11/12/2019   Actinic keratosis 11/12/2019   Scoliosis 01/09/2019   Advance care planning 12/28/2018   Healthcare maintenance 12/28/2018   Venous insufficiency    Thrombocytopenia    Prostatitis    Paroxysmal atrial fibrillation (HCC)    Hypothyroidism    History of oropharyngeal cancer 10/03/2018   Essential hypertension 08/12/2015   Palpitations 08/12/2015    ONSET DATE: 09/28/23 (referral date)    REFERRING DIAG: hypophonia  THERAPY DIAG:  Hypophonia  Dysphonia  Rationale for Evaluation and Treatment Rehabilitation  SUBJECTIVE:   SUBJECTIVE STATEMENT: Pt alert, pleasant, and cooperative Pt accompanied by: self  PERTINENT HISTORY & DIAGNOSTIC FINDINGS:  Pt is an 83 y.o. 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 DaTSCAN , 01/12/23, Bilateral mild  decreased putamen activity is a pattern suggestive of  Parkinsonian syndrome pathology. Larygoscopy, 05/13/23, Given vocal cords moving but with bowing seen by laryngoscopy, and I suspect it is contributing to the hoarseness and breathiness, I let him know that we could refer him to a laryngologist, he would like to complete speech therapy first to see if laryngology would be necessary. Pt had course of ST 04/13/23-05/27/23 with improved vocal quality.   PAIN:  Are you having pain? No  FALLS: Has patient fallen in last 6  months?  No  LIVING ENVIRONMENT: Lives with: lives with their spouse Lives in: House/apartment  PLOF:  Level of assistance: Independent with ADLs Employment: Retired  PATIENT GOALS    to improve communication in a variety of settings  OBJECTIVE:    TODAY'S TREATMENT:   Pt endorsed completing EMST, 5 sets of 5 breaths daily. Pt to continue for HEP for 5x/week. Pt completed x5 sets of x5 breaths with almost immediate improvement in loudness/vocal quality. Likely due to improved breath support.   Discussed performing EMST prior to [facility] ex's to bring awareness to breathing.  Reviewed Phonation Resistance Training Exercises. Evidence of mild-moderate volume and vocal quality decay during conversation continues. Education provided re: posture and role in breath support/voicing.       PATIENT EDUCATION: Education details: as above Person educated: Patient Education method: Explanation Education comprehension: verbalized understanding and needs further education       HOME EXERCISE PROGRAM:  Pushing/pulling ex's  EMST  Phorte ex's   GOALS: Goals reviewed with patient? Yes  SHORT TERM GOALS: Target date: 10 sessions  Pt will complete pushing/pulling exercises to improve vocal cord adduction x10 reps with no more than min cueing. Baseline: Goal status: MET  2.  The patient will complete HEP (Maximum duration ah, High/Lows, and Functional Phrases) at average loudness >/= 80 dB and with loud, good quality voice without reports of strain. Baseline:  Goal status: PROGRESSING  3.  The patient will complete Hierarchical Speech Loudness reading drills (words/phrases, sentences) at average >/= 75 dB and with loud, good quality voice without reports of strain. Baseline:  Goal status: MET  4.  The patient will participate in 5-8 minutes conversation, maintaining average loudness of 75 dB and good quality voice with modified independence.  Baseline:  Goal status:  PROGRESSING  5. Pt will report completing EMST for improve breath support 5x/week (may be completed via combination of at home and in tx sessions).  Baseline:  Goal Status: MET      LONG TERM GOALS: Target date: 12 weeks   The patient will complete HEP (Maximum duration ah, High/Lows, and Functional Phrases) at average loudness >/= 80 dB and with loud, good quality voice indep without reports of strain. Baseline:  Goal status: PROGRESSING    The patient will participate in 15-20 minutes conversation, maintaining average loudness of 75 dB and good quality voice with modified independence.  Baseline:  Goal status: PROGRESSING  4. Pt will report improvement in communication per PROM. Baseline:  Goal status: PROGRESSING  ASSESSMENT:  CLINICAL IMPRESSION: Patient is a 83 y.o. 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 Pt known well to SLP services and received ST 04/13/23-05/27/23. 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 Pt reports periods of aphonia and general decline in voice since ending ST sessions. Pt  presents with s/sx dysphonia and hypophonia. Voice is c/b auditory perceptual characteristics of hoarse/strained, occasionally breathy voice, and periods of aphonia. Pt with tendency to speak on residual capacity and utilize thoracic and clavicular breathing patterns. Pt stimulable to clear speech techniques (e.g. speak loud and slow) as well as pushing/pulling to improve vocal cord adduction. Pt eager to resume ST sessions to improve communication at both home and in the community. See details of today's tx session above. Recommend course of ST with an intensity based approach.  OBJECTIVE IMPAIRMENTS include voice disorder. These impairments are limiting patient from effectively communicating at home and in  community. Factors affecting potential to achieve goals and functional outcome are co-morbidities. Patient will benefit from skilled SLP services to address above impairments and improve overall function.  REHAB POTENTIAL: Good  PLAN: SLP FREQUENCY: 1-2x/week  SLP DURATION: 12 weeks  PLANNED INTERVENTIONS: Environmental controls, Cueing hierachy, Internal/external aids, Functional tasks, Multimodal communication approach, SLP instruction and feedback, Compensatory strategies, and Patient/family education    Delon Bangs, M.S., CCC-SLP Speech-Language Pathologist [place] [facility] [phone] [place])   [facility] Rehabilitation at [facility] [address] [place], [place], [address] Phone: [phone]   Fax:  [phone]

## 2023-11-23 ENCOUNTER — Telehealth: Payer: Self-pay

## 2023-11-23 ENCOUNTER — Ambulatory Visit

## 2023-11-23 NOTE — Telephone Encounter (Signed)
 Copied from CRM #[id number]. Topic: Referral - Status >> Nov 23, 2023 11:56 AM Mia F wrote: Reason for CRM: Jerel with [facility]. Waitinfor a pt irder from Dr Cleatus for 2-3 weeks. Have not receievd yet. Per chard a referral is in pending status. Can this please be complete and sent to [phone]. Pt cannot schedule until sent

## 2023-11-23 NOTE — Telephone Encounter (Signed)
 Faxed referral to [facility]. I attempted to call and the business office is closed for today. I will try again tomorrow

## 2023-11-24 ENCOUNTER — Encounter: Payer: Self-pay | Admitting: Family Medicine

## 2023-11-24 NOTE — Telephone Encounter (Signed)
 Reached out to rehab location at [facility]. I left a message on their voicemail in reference to referral I have attempted to fax several times. If someone calls back please verify the fax number.

## 2023-11-25 ENCOUNTER — Encounter: Payer: Self-pay | Admitting: General Surgery

## 2023-11-25 ENCOUNTER — Ambulatory Visit

## 2023-11-25 ENCOUNTER — Ambulatory Visit (INDEPENDENT_AMBULATORY_CARE_PROVIDER_SITE_OTHER): Payer: Self-pay | Admitting: General Surgery

## 2023-11-25 VITALS — BP 170/73 | HR 54 | Temp 98.1°F | Ht 69.0 in | Wt 171.0 lb

## 2023-11-25 DIAGNOSIS — R490 Dysphonia: Secondary | ICD-10-CM | POA: Diagnosis not present

## 2023-11-25 DIAGNOSIS — R498 Other voice and resonance disorders: Secondary | ICD-10-CM

## 2023-11-25 DIAGNOSIS — K429 Umbilical hernia without obstruction or gangrene: Secondary | ICD-10-CM

## 2023-11-25 NOTE — Telephone Encounter (Signed)
 Patient called, stated he has been trying for three weeks for get a referral for rehab for his right hip. Informed patient that the nurse has been attempting to reach out to the rehab in [facility] and that they have been given the incorrect fax number. Patient gave me this fax number to use. fax number: [phone] If there's any questions, please call back the patient to discuss.

## 2023-11-25 NOTE — Patient Instructions (Signed)
 Follow up in 6 months.  Umbilical Hernia, Adult  A hernia is a lump of tissue that pushes through an opening in the muscles. An umbilical hernia happens in the belly, near the belly button. The hernia may contain tissues from the small or large intestine. It may also have fatty tissue that covers the intestines. Umbilical hernias in adults may get worse over time. They need to be treated with surgery. There are several types of umbilical hernias. They include: Indirect hernia. This occurs just above or below the belly button. It's the most common type of umbilical hernia in adults. Direct hernia. This type occurs in an opening that's formed by the belly button. Reducible hernia. This hernia comes and goes. You may see it only when you strain, cough, or lift something heavy. This type of hernia can be pushed back into the belly (reduced). Incarcerated hernia. This traps the hernia in the wall of the belly. This type of hernia can't be pushed back into the belly. It can cause a strangulated hernia. Strangulated hernia. This hernia cuts off blood flow to the tissues inside the hernia. The tissues can die if this happens. This type of hernia must be treated right away. What are the causes? An umbilical hernia happens when tissue inside the belly pushes through an opening in the muscles of the belly. What increases the risk? You're more likely to get this hernia if: You strain while lifting or pushing heavy objects. You've had several pregnancies. You have a condition that puts pressure on your belly, and you've had it for a long time. These include: Obesity. A buildup of fluid inside your belly. Vomiting or coughing all the time. Trouble pooping (constipation). You've had surgery that weakened the muscles in the belly. What are the signs or symptoms? The main symptom of this condition is a bulge at the belly button or near it. The bulge does not cause pain. Other symptoms depend on the type of  hernia you have. A reducible hernia may be seen only when you strain, cough, or lift something heavy. Other symptoms may include: Dull pain. A feeling of pressure. An incarcerated hernia may cause very bad pain. Also, you may: Vomit or feel like you may vomit. Not be able to pass gas. A strangulated hernia may cause: Pain that gets worse and worse. Vomiting, or feeling like you may vomit. Pain when you press on the hernia. Change of color on the skin over the hernia. The skin may become red or purple. Trouble pooping. Blood in the poop. How is this diagnosed? This condition may be diagnosed based on: Your symptoms and medical history. A physical exam. You may be asked to cough or strain while standing. These actions will put pressure inside your belly. The pressure can force the hernia through the opening in your muscles. Your health care provider may try to push the hernia back into your belly (reduce). How is this treated? Surgery is the only treatment for an umbilical hernia. Surgery for a strangulated hernia must be done right away. If you have a small hernia that's not incarcerated, you may need to lose weight before the surgery is done. Follow these instructions at home: Managing constipation You may need to take these actions to prevent trouble pooping. This will help to prevent straining. Drink enough fluid to keep your pee (urine) pale yellow. Take over-the-counter or prescription medicines. Eat foods that are high in fiber, such as beans, whole grains, and fresh fruits and vegetables.  Limit foods that are high in fat and sugars, such as fried or sweet foods. General instructions Do not try to push the hernia back in. Lose weight, if told by your provider. Watch your hernia for any changes in color or size. Tell your provider if any changes occur. You may need to avoid activities that put pressure on your hernia. You may have to avoid lifting. Ask your provider how much  you can safely lift. Take over-the-counter and prescription medicines only as told by your provider. Contact a health care provider if: Your hernia gets larger or feels hard. Your hernia becomes painful. You get a fever or chills. Get help right away if: You get very bad pain near the area of the hernia, and the pain comes on suddenly. You have pain and you vomit or feel like you may vomit. The skin over your hernia changes color. These symptoms may be an emergency. Get help right away. Call 911. Do not wait to see if the symptoms go away. Do not drive yourself to the hospital. This information is not intended to replace advice given to you by your health care provider. Make sure you discuss any questions you have with your health care provider. Document Revised: 06/09/2022 Document Reviewed: 06/09/2022 Elsevier Patient Education  2024 [employer].

## 2023-11-25 NOTE — Therapy (Signed)
 OUTPATIENT SPEECH LANGUAGE PATHOLOGY VOICE TREATMENT   Patient Name: Norman Mitchell MRN: [id number] DOB:1940-03-12, 83 y.o., male Today's Date: 11/25/2023  PCP: Arlyss Solian, MD  REFERRING PROVIDER: Asberry Schneider, DO   End of Session - 11/25/23 1155     Visit Number 13    Number of Visits 24    Date for Recertification  12/29/23    SLP Start Time 0930    SLP Stop Time  1015    SLP Time Calculation (min) 45 min    Activity Tolerance Patient tolerated treatment well          Past Medical History:  Diagnosis Date   Arthritis    Hands, knee   Back pain    Complication of anesthesia    Was told after 1 surgery that his HR went really low.  Says his normal HR is 50-55.   DVT (deep venous thrombosis) (HCC) 2017   Bilateral knees.  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   Hypothyroidism    Oral cancer (HCC)    Status post radiation.   Paroxysmal atrial fibrillation (HCC)    Prostatitis    Scoliosis    Thrombocytopenia    Venous insufficiency    legs   Past Surgical History:  Procedure Laterality Date   CATARACT EXTRACTION Bilateral    KNEE ARTHROSCOPY     KNEE ARTHROSCOPY WITH MEDIAL MENISECTOMY Right 01/09/2020   Procedure: RIGHT KNEE ARTHROSCOPY WITH MEDIAL MENISECTOMY;  Surgeon: Marchia Drivers, MD;  Location: [facility];  Service: Orthopedics;  Laterality: Right;   Oral biopsy     PILONIDAL CYST EXCISION     PROSTATE BIOPSY     Negative   SEPTOPLASTY     SHOULDER ARTHROSCOPY WITH ROTATOR CUFF REPAIR AND SUBACROMIAL DECOMPRESSION Right 07/02/2020   Procedure: RIGHT SHOULDER ARTHROSCOPY  DISTAL CLAVICLE EXCISION, BICEPS TENODESIS, SUBACROMIAL DECOMPRESSION, ANTERIOR LABRAL REPAIR. AND MINI OPEN ROTATOR CUFF REPAIR;  Surgeon: Krasinski, Kevin, MD;  Location: [facility];  Service: Orthopedics;  Laterality: Right;   TONSILLECTOMY     Patient Active Problem List   Diagnosis Date Noted   Umbilical hernia without obstruction and without gangrene 11/10/2023    Parkinsonian features 11/09/2023   Hip pain 11/03/2023   Left sided sciatica 05/26/2023   Change in stool 04/28/2023   Medicare annual wellness visit, subsequent 11/29/2022   Tremor 11/29/2022   GERD (gastroesophageal reflux disease) 03/15/2022   Irregular heart rhythm 11/05/2021   White coat syndrome with high blood pressure without hypertension 11/05/2021   Paresthesia 11/10/2020   Erectile dysfunction 11/10/2020   PSA elevation 11/10/2020   Osteoarthritis of right knee 06/21/2020   Synovitis of knee 06/21/2020   Partial thickness rotator cuff tear 06/21/2020   Knee pain 11/12/2019   Actinic keratosis 11/12/2019   Scoliosis 01/09/2019   Advance care planning 12/28/2018   Healthcare maintenance 12/28/2018   Venous insufficiency    Thrombocytopenia    Prostatitis    Paroxysmal atrial fibrillation (HCC)    Hypothyroidism    History of oropharyngeal cancer 10/03/2018   Essential hypertension 08/12/2015   Palpitations 08/12/2015    ONSET DATE: 09/28/23 (referral date)    REFERRING DIAG: hypophonia  THERAPY DIAG:  Hypophonia  Dysphonia  Rationale for Evaluation and Treatment Rehabilitation  SUBJECTIVE:   SUBJECTIVE STATEMENT: Pt alert, pleasant, and cooperative Pt accompanied by: self  PERTINENT HISTORY & DIAGNOSTIC FINDINGS:  Pt is an 83 y.o. 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 DaTSCAN , 01/12/23, Bilateral mild  decreased putamen activity is a pattern suggestive of  Parkinsonian syndrome pathology. Larygoscopy, 05/13/23, Given vocal cords moving but with bowing seen by laryngoscopy, and I suspect it is contributing to the hoarseness and breathiness, I let him know that we could refer him to a laryngologist, he would like to complete speech therapy first to see if laryngology would be necessary. Pt had course of ST 04/13/23-05/27/23 with improved vocal quality.   PAIN:  Are you having pain? No  FALLS: Has patient fallen in last 6  months?  No  LIVING ENVIRONMENT: Lives with: lives with their spouse Lives in: House/apartment  PLOF:  Level of assistance: Independent with ADLs Employment: Retired  PATIENT GOALS    to improve communication in a variety of settings  OBJECTIVE:    TODAY'S TREATMENT:   Pt endorsed completing EMST, 5 sets of 5 breaths daily. Pt to continue for HEP for 5x/week. Pt completed x5 sets of x5 breaths with almost immediate improvement in loudness/vocal quality. Likely due to improved breath support.   Discussed performing EMST prior to [facility] ex's to bring awareness to breathing.  Reviewed Phonation Resistance Training Exercises and pt completed with rare-min cues. Evidence of mild-moderate volume and vocal quality decay during conversation continues. Education provided re: posture and role in breath support/voicing as well as immediate voicing with exhalation. Reviewed head/neck stretches as pt endorsed feeling like he was tensing up while stretch. Pt completed and video sent to pt's email.   Additionally, pt asking about local laryngologists. Email sent with closest laryngologists [place], [place], [place]) per pt request.       PATIENT EDUCATION: Education details: as above Person educated: Patient Education method: Explanation Education comprehension: verbalized understanding and needs further education       HOME EXERCISE PROGRAM:  Pushing/pulling ex's  EMST  Phorte ex's   GOALS: Goals reviewed with patient? Yes  SHORT TERM GOALS: Target date: 10 sessions  Pt will complete pushing/pulling exercises to improve vocal cord adduction x10 reps with no more than min cueing. Baseline: Goal status: MET  2.  The patient will complete HEP (Maximum duration ah, High/Lows, and Functional Phrases) at average loudness >/= 80 dB and with loud, good quality voice without reports of strain. Baseline:  Goal status: PROGRESSING  3.  The patient will complete Hierarchical Speech Loudness reading  drills (words/phrases, sentences) at average >/= 75 dB and with loud, good quality voice without reports of strain. Baseline:  Goal status: MET  4.  The patient will participate in 5-8 minutes conversation, maintaining average loudness of 75 dB and good quality voice with modified independence.  Baseline:  Goal status: PROGRESSING  5. Pt will report completing EMST for improve breath support 5x/week (may be completed via combination of at home and in tx sessions).  Baseline:  Goal Status: MET      LONG TERM GOALS: Target date: 12 weeks   The patient will complete HEP (Maximum duration ah, High/Lows, and Functional Phrases) at average loudness >/= 80 dB and with loud, good quality voice indep without reports of strain. Baseline:  Goal status: PROGRESSING    The patient will participate in 15-20 minutes conversation, maintaining average loudness of 75 dB and good quality voice with modified independence.  Baseline:  Goal status: PROGRESSING  4. Pt will report improvement in communication per PROM. Baseline:  Goal status: PROGRESSING  ASSESSMENT:  CLINICAL IMPRESSION: Patient is a 83 y.o. male who was seen today for voice in setting of Parkinsonism and bowed vocal  cords (noted on laryngoscopy in March 2025). Pt known well to SLP services and received ST 04/13/23-05/27/23. 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 Pt reports periods of aphonia and general decline in voice since ending ST sessions. Pt presents with s/sx dysphonia and hypophonia. Voice is c/b auditory perceptual characteristics of hoarse/strained, occasionally breathy voice, and periods of aphonia. Pt with tendency to speak on residual capacity and utilize thoracic and clavicular breathing patterns. Pt stimulable to clear speech techniques (e.g. speak loud and slow) as well as pushing/pulling to improve vocal cord adduction. Pt eager  to resume ST sessions to improve communication at both home and in the community. See details of today's tx session above. Recommend course of ST with an intensity based approach.  OBJECTIVE IMPAIRMENTS include voice disorder. These impairments are limiting patient from effectively communicating at home and in community. Factors affecting potential to achieve goals and functional outcome are co-morbidities. Patient will benefit from skilled SLP services to address above impairments and improve overall function.  REHAB POTENTIAL: Good  PLAN: SLP FREQUENCY: 1-2x/week  SLP DURATION: 12 weeks  PLANNED INTERVENTIONS: Environmental controls, Cueing hierachy, Internal/external aids, Functional tasks, Multimodal communication approach, SLP instruction and feedback, Compensatory strategies, and Patient/family education    Delon Bangs, M.S., CCC-SLP Speech-Language Pathologist [place] [facility] [phone] [place])   [facility] Outpatient Rehabilitation at [facility] [address] [place], [place], [address] Phone: [phone]   Fax:  [phone]

## 2023-11-25 NOTE — Telephone Encounter (Signed)
 Closing encounter. Fax was sent successfully

## 2023-11-29 ENCOUNTER — Ambulatory Visit: Payer: Medicare Other

## 2023-11-29 ENCOUNTER — Ambulatory Visit: Payer: Medicare Other | Admitting: Family Medicine

## 2023-11-30 ENCOUNTER — Ambulatory Visit

## 2023-11-30 ENCOUNTER — Other Ambulatory Visit: Payer: Self-pay | Admitting: Family Medicine

## 2023-11-30 VITALS — BP 140/76 | Ht 69.0 in | Wt 166.0 lb

## 2023-11-30 DIAGNOSIS — R490 Dysphonia: Secondary | ICD-10-CM | POA: Diagnosis not present

## 2023-11-30 DIAGNOSIS — R498 Other voice and resonance disorders: Secondary | ICD-10-CM

## 2023-11-30 DIAGNOSIS — Z0000 Encounter for general adult medical examination without abnormal findings: Secondary | ICD-10-CM | POA: Diagnosis not present

## 2023-11-30 NOTE — Progress Notes (Signed)
 Because this visit was a virtual/telehealth visit,  certain criteria was not obtained, such a blood pressure, CBG if applicable, and timed get up and go. Any medications not marked as taking were not mentioned during the medication reconciliation part of the visit. Any vitals not documented were not able to be obtained due to this being a telehealth visit or patient was unable to self-report a recent blood pressure reading due to a lack of equipment at home via telehealth. Vitals that have been documented are verbally provided by the patient.   This visit was performed by a medical professional under my direct supervision. I was immediately available for consultation/collaboration. I have reviewed and agree with the Annual Wellness Visit documentation.  Subjective:   Norman Mitchell is a 83 y.o. who presents for a Medicare Wellness preventive visit.  As a reminder, Annual Wellness Visits don't include a physical exam, and some assessments may be limited, especially if this visit is performed virtually. We may recommend an in-person follow-up visit with your provider if needed.  Visit Complete: Virtual I connected with  Norman Mitchell on 11/30/23 by a video and audio enabled telemedicine application and verified that I am speaking with the correct person using two identifiers.  Patient Location: Home  Provider Location: Home Office  I discussed the limitations of evaluation and management by telemedicine. The patient expressed understanding and agreed to proceed.  Vital Signs: Because this visit was a virtual/telehealth visit, some criteria may be missing or patient reported. Any vitals not documented were not able to be obtained and vitals that have been documented are patient reported.  Persons Participating in Visit: Patient.  AWV Questionnaire: Yes: Patient Medicare AWV questionnaire was completed by the patient on 11/27/2023; I have confirmed that all information answered by patient is  correct and no changes since this date.  Cardiac Risk Factors include: advanced age (>19men, >20 women);male gender;hypertension;Other (see comment), Risk factor comments: afib     Objective:    Today's Vitals   11/30/23 1526  BP: (!) 140/76  Weight: 166 lb (75.3 kg)  Height: 5' 9 (1.753 m)   Body mass index is 24.51 kg/m.     11/30/2023    3:28 PM 09/26/2023    3:41 PM 08/24/2023    1:56 PM 12/23/2022    9:59 AM 11/05/2021    8:49 AM 07/02/2020   10:50 AM 01/09/2020   12:23 PM  Advanced Directives  Does Patient Have a Medical Advance Directive? Yes No Yes Yes No;Yes Yes Yes  Type of Estate agent of [place];Living will  Living will Living will Healthcare Power of Norman Mitchell;Living will Healthcare Power of [place];Living will Healthcare Power of [place];Living will  Does patient want to make changes to medical advance directive? No - Patient declined    Yes (Inpatient - patient defers changing a medical advance directive and declines information at this time) No - Patient declined No - Patient declined  Copy of Healthcare Power of Attorney in Chart? Yes - validated most recent copy scanned in chart (See row information)    Yes - validated most recent copy scanned in chart (See row information) No - copy requested Yes - validated most recent copy scanned in chart (See row information)  Would patient like information on creating a medical advance directive?     No - Patient declined      Current Medications (verified) Outpatient Encounter Medications as of 11/30/2023  Medication Sig   albuterol  (VENTOLIN  HFA) 108 (90 Base)  MCG/ACT inhaler INHALE ONE TO TWO PUFFS BY MOUTH EVERY 6 HOURS AS NEEDED   alfuzosin  (UROXATRAL ) 10 MG 24 hr tablet Take 1 tablet (10 mg total) by mouth daily.   aspirin  EC (ASPIRIN  LOW DOSE) 81 MG tablet TAKE 1 TABLET BY MOUTH DAILY SWALLOW WHOLE   CRANBERRY EXTRACT PO Take 500 mg by mouth daily.   fluticasone  (FLONASE ) 50 MCG/ACT nasal spray  Place 2 sprays into both nostrils daily.   levothyroxine  (SYNTHROID ) 88 MCG tablet Take 1 tablet (88 mcg total) by mouth daily.   Lutein 20 MG CAPS Take by mouth daily.   Melatonin 1 MG CAPS Take 1-3 mg by mouth at bedtime as needed (for sleep).   Multiple Vitamins-Minerals (SENIOR MULTIVITAMIN PLUS PO) Take 1 tablet by mouth daily.   polyethylene glycol powder (GLYCOLAX /MIRALAX ) 17 GM/SCOOP powder Take 17 g by mouth every other day.   sildenafil  (VIAGRA ) 100 MG tablet Take 1 tablet (100 mg total) by mouth daily as needed for erectile dysfunction.   No facility-administered encounter medications on file as of 11/30/2023.    Allergies (verified) Bactrim [sulfamethoxazole-trimethoprim], Ciprofloxacin, Fish oil, Ibuprofen, and Levofloxacin   History: Past Medical History:  Diagnosis Date   Arthritis    Hands, knee   Back pain    Complication of anesthesia    Was told after 1 surgery that his HR went really low.  Says his normal HR is 50-55.   DVT (deep venous thrombosis) (HCC) 2017   Bilateral knees.  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   Hypothyroidism    Oral cancer (HCC)    Status post radiation.   Paroxysmal atrial fibrillation (HCC)    Prostatitis    Scoliosis    Thrombocytopenia    Venous insufficiency    legs   Past Surgical History:  Procedure Laterality Date   CATARACT EXTRACTION Bilateral    KNEE ARTHROSCOPY     KNEE ARTHROSCOPY WITH MEDIAL MENISECTOMY Right 01/09/2020   Procedure: RIGHT KNEE ARTHROSCOPY WITH MEDIAL MENISECTOMY;  Surgeon: Marchia Drivers, MD;  Location: [facility];  Service: Orthopedics;  Laterality: Right;   Oral biopsy     PILONIDAL CYST EXCISION     PROSTATE BIOPSY     Negative   SEPTOPLASTY     SHOULDER ARTHROSCOPY WITH ROTATOR CUFF REPAIR AND SUBACROMIAL DECOMPRESSION Right 07/02/2020   Procedure: RIGHT SHOULDER ARTHROSCOPY  DISTAL CLAVICLE EXCISION, BICEPS TENODESIS, SUBACROMIAL DECOMPRESSION, ANTERIOR LABRAL REPAIR. AND MINI OPEN ROTATOR  CUFF REPAIR;  Surgeon: Krasinski, Kevin, MD;  Location: [facility];  Service: Orthopedics;  Laterality: Right;   TONSILLECTOMY     Family History  Problem Relation Age of Onset   Heart disease Mother    Varicose Veins Mother    High Cholesterol Father    High blood pressure Father    Stroke Father    Hearing loss Father    Parkinson's disease Father    Parkinsonism Father    Atrial fibrillation Sister    Atrial fibrillation Sister    Heart attack Brother    Prostate cancer Paternal Grandfather        Possible diagnosis, not definite.   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   Retired.   [employer] [phone] through 52.  E5.  He had sig noise exposure that was service related.     Enjoys hiking, painting, Diplomatic Services operational officer, woodworking   Lives at [facility]   Right handed    Social Drivers of Health   Financial Resource Strain: Low Risk  (11/30/2023)   Overall Financial Resource Strain (CARDIA)    Difficulty of Paying Living Expenses: Not hard at all  Food Insecurity: No Food Insecurity (11/30/2023)   Hunger Vital Sign    Worried About Running  Out of Food in the Last Year: Never true    Ran Out of Food in the Last Year: Never true  Transportation Needs: No Transportation Needs (11/30/2023)   PRAPARE - Administrator, Civil Service (Medical): No    Lack of Transportation (Non-Medical): No  Physical Activity: Sufficiently Active (11/30/2023)   Exercise Vital Sign    Days of Exercise per Week: 6 days    Minutes of Exercise per Session: 50 min  Stress: No Stress Concern Present (11/30/2023)   [employer] of Occupational Health - Occupational Stress Questionnaire    Feeling of Stress: Not at all  Social Connections: Socially Integrated (11/30/2023)   Social Connection and Isolation Panel    Frequency of Communication with Friends and Family: Twice a week    Frequency of Social Gatherings with Friends and Family: More than three times a week    Attends Religious Services: More than 4 times per year    Active Member of [employer] or Organizations: Yes    Attends Engineer, structural: More than 4 times per year    Marital Status: Married    Tobacco Counseling Counseling given: Not Answered    Clinical Intake:  Pre-visit preparation completed: Yes  Pain : No/denies pain     BMI - recorded: 24.51 Nutritional Status: BMI of 19-24  Normal Nutritional Risks: None Diabetes: No  No results found for: HGBA1C   How often do you need to have someone help you when you read instructions, pamphlets, or other written materials from your doctor or pharmacy?: 1 - Never  Interpreter Needed?: No  Information entered by :: Alma Mohiuddin,CMA   Activities of Daily Living     11/27/2023    4:39 PM  In your present state of health, do you have any difficulty performing the following activities:  Hearing? 0  Vision? 0  Difficulty concentrating or making decisions? 0  Walking or climbing stairs? 0  Dressing or bathing? 0  Doing errands, shopping? 0  Preparing Food and eating ? N  Using the Toilet? N  In  the past six months, have you accidently leaked urine? Y  Do you have problems with loss of bowel control? N  Managing your Medications? N  Managing your Finances? N  Housekeeping or managing your Housekeeping? N    Patient Care Team: Norman Arlyss RAMAN, MD as PCP - General (Family Medicine) Darliss Rogue, MD as PCP - Cardiology (Cardiology) Cindie Ole DASEN, MD as PCP - Electrophysiology (Cardiology) Clois Silver Pereyra, MD as Referring Physician (Otolaryngology)  I have updated your Care Teams any recent Medical Services you may have received from other providers in the past year.     Assessment:   This is a routine wellness examination for Norman Mitchell.  Hearing/Vision screen Hearing Screening - Comments:: Patient wears hearing aids  Vision Screening - Comments:: Patient wears glasses    Goals Addressed             This Visit's Progress    Patient Stated       Patient would like to learn new things        Depression Screen     11/30/2023    3:29 PM 11/02/2023    2:24 PM 08/03/2023    2:26 PM 04/27/2023   10:16 AM 02/08/2023   10:03 AM 12/25/2022    3:37 PM 11/26/2022    9:03 AM  PHQ 2/9 Scores  PHQ - 2 Score 0 0 0 0 0 0 0  PHQ- 9 Score 0  0 0 0 0 0    Fall Risk     11/27/2023    4:39 PM 11/02/2023    2:24 PM 08/24/2023    1:56 PM 08/03/2023    2:26 PM 06/10/2023    4:01 PM  Fall Risk   Falls in the past year? 1 1 0 0 0  Number falls in past yr: 0 0 0 0 0  Injury with Fall? 0 0 0 0 0  Risk for fall due to : History of fall(s) History of fall(s)  No Fall Risks No Fall Risks  Follow up Falls evaluation completed;Education provided;Falls prevention discussed Falls evaluation completed Falls evaluation completed Falls evaluation completed Falls evaluation completed    MEDICARE RISK AT HOME:  Medicare Risk at Home Any stairs in or around the home?: (Patient-Rptd) Yes If so, are there any without handrails?: (Patient-Rptd) No Home free of loose throw rugs in  walkways, pet beds, electrical cords, etc?: (Patient-Rptd) No Adequate lighting in your home to reduce risk of falls?: (Patient-Rptd) Yes Life alert?: (Patient-Rptd) No Use of a cane, walker or w/c?: (Patient-Rptd) No Grab bars in the bathroom?: (Patient-Rptd) Yes Shower chair or bench in shower?: (Patient-Rptd) Yes Elevated toilet seat or a handicapped toilet?: (Patient-Rptd) No  TIMED UP AND GO:  Was the test performed?  No  Cognitive Function: 6CIT completed    11/08/2019    4:26 PM  MMSE - Mini Mental State Exam  Orientation to time 5  Orientation to Place 5  Registration 3  Attention/ Calculation 5  Recall 3  Language- repeat 1        11/30/2023    3:27 PM 11/05/2021    8:52 AM  6CIT Screen  What Year? 0 points 0 points  What month? 0 points 0 points  What time? 0 points 0 points  Count back from 20 0 points 0 points  Months in reverse 0 points 0 points  Repeat phrase 0 points 0 points  Total Score 0 points 0 points    Immunizations Immunization History  Administered Date(s) Administered    sv, Bivalent, Protein Subunit Rsvpref,pf [place]) 02/16/2022   Fluad Quad(high Dose 65+) 11/30/2019, 11/06/2021, 12/24/2022   Hepatitis A 04/04/2003, 10/23/2003   Hepatitis  B 04/17/2004, 05/19/2004, 11/27/2004   INFLUENZA, HIGH DOSE SEASONAL PF 12/24/2022   IPV 04/24/2008   Influenza-Unspecified 12/30/2015, 11/28/2018, 12/12/2020   Moderna Covid-19 Vaccine Bivalent Booster 22yrs & up 07/29/2021   Moderna SARS-COV2 Booster Vaccination 06/11/2023   Moderna Sars-Covid-2 Vaccination 03/16/2019, 04/13/2019, 01/16/2020, 07/18/2020, 05/21/2022   PFIZER Comirnaty(Gray Top)Covid-19 Tri-Sucrose Vaccine 11/26/2021   PFIZER(Purple Top)SARS-COV-2 Vaccination 11/22/2020   Pfizer Covid-19 Vaccine Bivalent Booster 66yrs & up 11/22/2020   Pfizer(Comirnaty)Fall Seasonal Vaccine 12 years and older 11/26/2021, 05/21/2022, 01/14/2023   Pneumococcal Conjugate-13 10/12/2013   Pneumococcal  Polysaccharide-23 05/08/2002, 11/13/2009   Tdap 03/27/2008, 08/08/2018   Zoster Recombinant(Shingrix) 12/07/2017, 03/14/2018    Screening Tests Health Maintenance  Topic Date Due   Influenza Vaccine  05/30/2024 (Originally 10/01/2023)   COVID-19 Vaccine (10 - Moderna risk 2024-25 season) 12/11/2023   Medicare Annual Wellness (AWV)  11/29/2024   DTaP/Tdap/Td (3 - Td or Tdap) 08/07/2028   Pneumococcal Vaccine: 50+ Years  Completed   Zoster Vaccines- Shingrix  Completed   HPV VACCINES  Aged Out   Meningococcal B Vaccine  Aged Out   Hepatitis B Vaccines 19-59 Average Risk  Discontinued    Additional Screening:  Vision Screening: Recommended annual ophthalmology exams for early detection of glaucoma and other disorders of the eye. Is the patient up to date with their annual eye exam?  Yes  Who is the provider or what is the name of the office in which the patient attends annual eye exams? Dr. Mevelyn   Dental Screening: Recommended annual dental exams for proper oral hygiene  Community Resource Referral / Chronic Care Management: CRR required this visit?  No   CCM required this visit?  No   Plan:    I have personally reviewed and noted the following in the patient's chart:   Medical and social history Use of alcohol, tobacco or illicit drugs  Current medications and supplements including opioid prescriptions. Patient is not currently taking opioid prescriptions. Functional ability and status Nutritional status Physical activity Advanced directives List of other physicians Hospitalizations, surgeries, and ER visits in previous 12 months Vitals Screenings to include cognitive, depression, and falls Referrals and appointments  In addition, I have reviewed and discussed with patient certain preventive protocols, quality metrics, and best practice recommendations. A written personalized care plan for preventive services as well as general preventive health recommendations were  provided to patient.   Lyle MARLA Right, [place]   11/30/2023   After Visit Summary: (MyChart) Due to this being a telephonic visit, the after visit summary with patients personalized plan was offered to patient via MyChart   Notes: Nothing significant to report at this time.

## 2023-11-30 NOTE — Progress Notes (Signed)
 Patient ID: Norman Mitchell, male   DOB: [birth year], 83 y.o.   MRN: [id number] CC: Umbilical Hernia History of Present Illness Norman Mitchell is a 83 y.o. male with past medical history as below who presents in consultation for umbilical hernia.  The patient reports that he has noted a bulge in his abdomen for some time.  He says that when he lays down it is easily reducible.  He denies any overlying skin changes, nausea vomiting or obstipation symptoms.  He is tolerating a regular diet.  He does not have any pain from the umbilicus..  Past Medical History Past Medical History:  Diagnosis Date   Arthritis    Hands, knee   Back pain    Complication of anesthesia    Was told after 1 surgery that his HR went really low.  Says his normal HR is 50-55.   DVT (deep venous thrombosis) (HCC) 2017   Bilateral knees.  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   Hypothyroidism    Oral cancer (HCC)    Status post radiation.   Paroxysmal atrial fibrillation (HCC)    Prostatitis    Scoliosis    Thrombocytopenia    Venous insufficiency    legs       Past Surgical History:  Procedure Laterality Date   CATARACT EXTRACTION Bilateral    KNEE ARTHROSCOPY     KNEE ARTHROSCOPY WITH MEDIAL MENISECTOMY Right 01/09/2020   Procedure: RIGHT KNEE ARTHROSCOPY WITH MEDIAL MENISECTOMY;  Surgeon: Marchia Drivers, MD;  Location: [facility];  Service: Orthopedics;  Laterality: Right;   Oral biopsy     PILONIDAL CYST EXCISION     PROSTATE BIOPSY     Negative   SEPTOPLASTY     SHOULDER ARTHROSCOPY WITH ROTATOR CUFF REPAIR AND SUBACROMIAL DECOMPRESSION Right 07/02/2020   Procedure: RIGHT SHOULDER ARTHROSCOPY  DISTAL CLAVICLE EXCISION, BICEPS TENODESIS, SUBACROMIAL DECOMPRESSION, ANTERIOR LABRAL REPAIR. AND MINI OPEN ROTATOR CUFF REPAIR;  Surgeon: Krasinski, Kevin, MD;  Location: [facility];  Service: Orthopedics;  Laterality: Right;   TONSILLECTOMY      Allergies  Allergen Reactions   Bactrim  [Sulfamethoxazole-Trimethoprim] Hives   Ciprofloxacin Other (See Comments)    Leg tendonitis    Fish Oil    Ibuprofen Other (See Comments)    Renal failure with high doses.    Levofloxacin Other (See Comments)    Severe leg tendonitis    Current Outpatient Medications  Medication Sig Dispense Refill   albuterol  (VENTOLIN  HFA) 108 (90 Base) MCG/ACT inhaler INHALE ONE TO TWO PUFFS BY MOUTH EVERY 6 HOURS AS NEEDED 8.5 g 2   alfuzosin  (UROXATRAL ) 10 MG 24 hr tablet Take 1 tablet (10 mg total) by mouth daily. 90 tablet 3   aspirin  EC (ASPIRIN  LOW DOSE) 81 MG tablet TAKE 1 TABLET BY MOUTH DAILY SWALLOW WHOLE 90 tablet 3   CRANBERRY EXTRACT PO Take 500 mg by mouth daily.     fluticasone  (FLONASE ) 50 MCG/ACT nasal spray Place 2 sprays into both nostrils daily. 16 g 12   levothyroxine  (SYNTHROID ) 88 MCG tablet Take 1 tablet (88 mcg total) by mouth daily. 90 tablet 3   Lutein 20 MG CAPS Take by mouth daily.     Melatonin 1 MG CAPS Take 1-3 mg by mouth at bedtime as needed (for sleep).     Multiple Vitamins-Minerals (SENIOR MULTIVITAMIN PLUS PO) Take 1 tablet by mouth daily.     polyethylene glycol powder (GLYCOLAX /MIRALAX ) 17 GM/SCOOP powder Take 17 g by mouth every other day.  sildenafil  (VIAGRA ) 100 MG tablet Take 1 tablet (100 mg total) by mouth daily as needed for erectile dysfunction. 30 tablet 11   No current facility-administered medications for this visit.    Family History Family History  Problem Relation Age of Onset   Heart disease Mother    Varicose Veins Mother    High Cholesterol Father    High blood pressure Father    Stroke Father    Hearing loss Father    Parkinson's disease Father    Parkinsonism Father    Atrial fibrillation Sister    Atrial fibrillation Sister    Heart attack Brother    Prostate cancer Paternal Grandfather        Possible diagnosis, not definite.   Colon cancer Neg Hx        Social History Social History   Tobacco Use   Smoking status:  Never    Passive exposure: Never   Smokeless tobacco: Never  Vaping Use   Vaping status: Never Used  Substance Use Topics   Alcohol use: Never   Drug use: Never        ROS Full ROS of systems performed and is otherwise negative there than what is stated in the HPI  Physical Exam Blood pressure (!) 170/73, pulse (!) 54, temperature 98.1 F (36.7 C), temperature source Oral, height 5' 9 (1.753 m), weight 171 lb (77.6 kg), SpO2 96%.  Alert and oriented x 3, normal work of breathing room air, regular rate and rhythm, abdomen soft, nontender nondistended, umbilical hernia is easily reducible  Data Reviewed He was seen for this by his primary care doctor and was noted to be reducible at that time.  I have personally reviewed the patient's imaging and medical records.    Assessment    Patient with easily reducible umbilical hernia without any pain.  Plan    I discussed with the patient that given that he is having pain in his hernia and ability reasonable to go into active surveillance.  I discussed with them the warning signs of hernia including overlying erythema, signs of incarceration and strangulation.  We will plan to see him again in 6 months.  A total of 45 minutes was spent reviewing the patient's chart, performing history and physical and discussing treatment options with the patient    Jayson MALVA Endow

## 2023-11-30 NOTE — Patient Instructions (Signed)
 Norman Mitchell,  Thank you for taking the time for your Medicare Wellness Visit. I appreciate your continued commitment to your health goals. Please review the care plan we discussed, and feel free to reach out if I can assist you further.  Medicare recommends these wellness visits once per year to help you and your care team stay ahead of potential health issues. These visits are designed to focus on prevention, allowing your provider to concentrate on managing your acute and chronic conditions during your regular appointments.  Please note that Annual Wellness Visits do not include a physical exam. Some assessments may be limited, especially if the visit was conducted virtually. If needed, we may recommend a separate in-person follow-up with your provider.  Ongoing Care Seeing your primary care provider every 3 to 6 months helps us  monitor your health and provide consistent, personalized care.   Referrals If a referral was made during today's visit and you haven't received any updates within two weeks, please contact the referred provider directly to check on the status.  Recommended Screenings:  Health Maintenance  Topic Date Due   Flu Shot  05/30/2024*   COVID-19 Vaccine (10 - Moderna risk 2024-25 season) 12/11/2023   Medicare Annual Wellness Visit  11/29/2024   DTaP/Tdap/Td vaccine (3 - Td or Tdap) 08/07/2028   Pneumococcal Vaccine for age over 18  Completed   Zoster (Shingles) Vaccine  Completed   HPV Vaccine  Aged Out   Meningitis B Vaccine  Aged Out   Hepatitis B Vaccine  Discontinued  *Topic was postponed. The date shown is not the original due date.       11/30/2023    3:28 PM  Advanced Directives  Does Patient Have a Medical Advance Directive? Yes  Type of Estate agent of [place];Living will  Does patient want to make changes to medical advance directive? No - Patient declined  Copy of Healthcare Power of Attorney in Chart? Yes - validated most  recent copy scanned in chart (See row information)   Advance Care Planning is important because it: Ensures you receive medical care that aligns with your values, goals, and preferences. Provides guidance to your family and loved ones, reducing the emotional burden of decision-making during critical moments.  Vision: Annual vision screenings are recommended for early detection of glaucoma, cataracts, and diabetic retinopathy. These exams can also reveal signs of chronic conditions such as diabetes and high blood pressure.  Dental: Annual dental screenings help detect early signs of oral cancer, gum disease, and other conditions linked to overall health, including heart disease and diabetes.  Please see the attached documents for additional preventive care recommendations.

## 2023-11-30 NOTE — Therapy (Signed)
 OUTPATIENT SPEECH LANGUAGE PATHOLOGY VOICE TREATMENT   Patient Name: Norman Mitchell MRN: [id number] DOB:[birth year], 83 y.o., male Today's Date: 11/30/2023  PCP: Arlyss Solian, MD  REFERRING PROVIDER: Asberry Schneider, DO   End of Session - 11/30/23 1306     Visit Number 14    Number of Visits 24    Date for Recertification  12/29/23    Progress Note Due on Visit 20    SLP Start Time 1308    SLP Stop Time  1355    SLP Time Calculation (min) 47 min    Activity Tolerance Patient tolerated treatment well          Past Medical History:  Diagnosis Date   Arthritis    Hands, knee   Back pain    Complication of anesthesia    Was told after 1 surgery that his HR went really low.  Says his normal HR is 50-55.   DVT (deep venous thrombosis) (HCC) 2017   Bilateral knees.  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   Hypothyroidism    Oral cancer (HCC)    Status post radiation.   Paroxysmal atrial fibrillation (HCC)    Prostatitis    Scoliosis    Thrombocytopenia    Venous insufficiency    legs   Past Surgical History:  Procedure Laterality Date   CATARACT EXTRACTION Bilateral    KNEE ARTHROSCOPY     KNEE ARTHROSCOPY WITH MEDIAL MENISECTOMY Right 01/09/2020   Procedure: RIGHT KNEE ARTHROSCOPY WITH MEDIAL MENISECTOMY;  Surgeon: Marchia Drivers, MD;  Location: [facility];  Service: Orthopedics;  Laterality: Right;   Oral biopsy     PILONIDAL CYST EXCISION     PROSTATE BIOPSY     Negative   SEPTOPLASTY     SHOULDER ARTHROSCOPY WITH ROTATOR CUFF REPAIR AND SUBACROMIAL DECOMPRESSION Right 07/02/2020   Procedure: RIGHT SHOULDER ARTHROSCOPY  DISTAL CLAVICLE EXCISION, BICEPS TENODESIS, SUBACROMIAL DECOMPRESSION, ANTERIOR LABRAL REPAIR. AND MINI OPEN ROTATOR CUFF REPAIR;  Surgeon: Krasinski, Kevin, MD;  Location: [facility];  Service: Orthopedics;  Laterality: Right;   TONSILLECTOMY     Patient Active Problem List   Diagnosis Date Noted   Umbilical hernia without obstruction and  without gangrene 11/10/2023   Parkinsonian features 11/09/2023   Hip pain 11/03/2023   Left sided sciatica 05/26/2023   Change in stool 04/28/2023   Medicare annual wellness visit, subsequent 11/29/2022   Tremor 11/29/2022   GERD (gastroesophageal reflux disease) 03/15/2022   Irregular heart rhythm 11/05/2021   White coat syndrome with high blood pressure without hypertension 11/05/2021   Paresthesia 11/10/2020   Erectile dysfunction 11/10/2020   PSA elevation 11/10/2020   Osteoarthritis of right knee 06/21/2020   Synovitis of knee 06/21/2020   Partial thickness rotator cuff tear 06/21/2020   Knee pain 11/12/2019   Actinic keratosis 11/12/2019   Scoliosis 01/09/2019   Advance care planning 12/28/2018   Healthcare maintenance 12/28/2018   Venous insufficiency    Thrombocytopenia    Prostatitis    Paroxysmal atrial fibrillation (HCC)    Hypothyroidism    History of oropharyngeal cancer 10/03/2018   Essential hypertension 08/12/2015   Palpitations 08/12/2015    ONSET DATE: 09/28/23 (referral date)    REFERRING DIAG: hypophonia  THERAPY DIAG:  Hypophonia  Dysphonia  Rationale for Evaluation and Treatment Rehabilitation  SUBJECTIVE:   SUBJECTIVE STATEMENT: Pt alert, pleasant, and cooperative Pt accompanied by: self  PERTINENT HISTORY & DIAGNOSTIC FINDINGS:  Pt is an 83 y.o. 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 DaTSCAN , 01/12/23, Bilateral mild decreased putamen activity is a pattern suggestive of  Parkinsonian syndrome pathology. Larygoscopy, 05/13/23, Given vocal cords moving but with bowing seen by laryngoscopy, and I suspect it is contributing to the hoarseness and breathiness, I let him know that we could refer him to a laryngologist, he would like to complete speech therapy first to see if laryngology would be necessary. Pt had course of ST 04/13/23-05/27/23 with improved vocal quality.   PAIN:  Are you having pain?  No  FALLS: Has patient fallen in last 6 months?  No  LIVING ENVIRONMENT: Lives with: lives with their spouse Lives in: House/apartment  PLOF:  Level of assistance: Independent with ADLs Employment: Retired  PATIENT GOALS    to improve communication in a variety of settings  OBJECTIVE:    TODAY'S TREATMENT:   Pt endorsed completing EMST, 5 sets of 5 breaths daily. Pt to continue for HEP for 5x/week. Pt completed x5 sets of x5 breaths with extra time.  Discussed performing EMST prior to [facility] ex's to bring awareness to breathing.  Reviewed Phonation Resistance Training Exercises and pt completed with mod cues  cues. Added, resonant voice therapy task (may me my moe moo) to HEP with the hope to facilitate more forward resonance. Pt performed with rare-min cues. Evidence of moderate volume and vocal quality decay during conversation continues       PATIENT EDUCATION: Education details: as above Person educated: Patient Education method: Explanation Education comprehension: verbalized understanding and needs further education       HOME EXERCISE PROGRAM:  Pushing/pulling ex's  EMST  Phorte ex's   GOALS: Goals reviewed with patient? Yes  SHORT TERM GOALS: Target date: 10 sessions  Pt will complete pushing/pulling exercises to improve vocal cord adduction x10 reps with no more than min cueing. Baseline: Goal status: MET  2.  The patient will complete HEP (Maximum duration ah, High/Lows, and Functional Phrases) at average loudness >/= 80 dB and with loud, good quality voice without reports of strain. Baseline:  Goal status: PROGRESSING  3.  The patient will complete Hierarchical Speech Loudness reading drills (words/phrases, sentences) at average >/= 75 dB and with loud, good quality voice without reports of strain. Baseline:  Goal status: MET  4.  The patient will participate in 5-8 minutes conversation, maintaining average loudness of 75 dB and good  quality voice with modified independence.  Baseline:  Goal status: PROGRESSING  5. Pt will report completing EMST for improve breath support 5x/week (may be completed via combination of at home and in tx sessions).  Baseline:  Goal Status: MET      LONG TERM GOALS: Target date: 12 weeks   The patient will complete HEP (Maximum duration ah, High/Lows, and Functional Phrases) at average loudness >/= 80 dB and with loud, good quality voice indep without reports of strain. Baseline:  Goal status: PROGRESSING    The patient will participate in 15-20 minutes conversation, maintaining average loudness of 75 dB and good quality voice with modified independence.  Baseline:  Goal status: PROGRESSING  4. Pt will report improvement in communication per PROM. Baseline:  Goal status: PROGRESSING  ASSESSMENT:  CLINICAL IMPRESSION: Patient is a 83 y.o. 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 Pt known well to SLP services and received ST 04/13/23-05/27/23. 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 Pt reports periods of aphonia and general decline in voice since ending ST sessions. Pt presents with s/sx dysphonia and hypophonia. Voice is c/b auditory perceptual characteristics of hoarse/strained, occasionally breathy voice, and periods of aphonia. Pt with tendency to speak on residual capacity and utilize thoracic and clavicular breathing patterns. Pt stimulable to clear speech techniques (e.g. speak loud and slow) as well as pushing/pulling to improve vocal cord adduction. Pt eager to resume ST sessions to improve communication at both home and in the community. See details of today's tx session above. Recommend course of ST with an intensity based approach.  OBJECTIVE IMPAIRMENTS include voice disorder. These impairments are  limiting patient from effectively communicating at home and in community. Factors affecting potential to achieve goals and functional outcome are co-morbidities. Patient will benefit from skilled SLP services to address above impairments and improve overall function.  REHAB POTENTIAL: Good  PLAN: SLP FREQUENCY: 1-2x/week  SLP DURATION: 12 weeks  PLANNED INTERVENTIONS: Environmental controls, Cueing hierachy, Internal/external aids, Functional tasks, Multimodal communication approach, SLP instruction and feedback, Compensatory strategies, and Patient/family education    Delon Bangs, M.S., CCC-SLP Speech-Language Pathologist [place] [facility] [phone] [place])  [facility] Outpatient Rehabilitation at [facility] [address] [place], [place], [address] Phone: [phone]   Fax:  [phone]

## 2023-12-02 ENCOUNTER — Ambulatory Visit: Attending: Neurology

## 2023-12-02 DIAGNOSIS — R490 Dysphonia: Secondary | ICD-10-CM | POA: Insufficient documentation

## 2023-12-02 DIAGNOSIS — R498 Other voice and resonance disorders: Secondary | ICD-10-CM | POA: Diagnosis present

## 2023-12-02 NOTE — Therapy (Signed)
 OUTPATIENT SPEECH LANGUAGE PATHOLOGY VOICE TREATMENT   Patient Name: Norman Mitchell MRN: [id number] DOB:05-31-1940, 83 y.o., male Today's Date: 12/02/2023  PCP: Arlyss Solian, MD  REFERRING PROVIDER: Asberry Schneider, DO   End of Session - 12/02/23 1153     Visit Number 15    Number of Visits 24    Date for Recertification  12/29/23    Progress Note Due on Visit 20    SLP Start Time 1020    SLP Stop Time  1100    SLP Time Calculation (min) 40 min    Activity Tolerance Patient tolerated treatment well          Past Medical History:  Diagnosis Date   Arthritis    Hands, knee   Back pain    Complication of anesthesia    Was told after 1 surgery that his HR went really low.  Says his normal HR is 50-55.   DVT (deep venous thrombosis) (HCC) 2017   Bilateral knees.  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   Hypothyroidism    Oral cancer (HCC)    Status post radiation.   Paroxysmal atrial fibrillation (HCC)    Prostatitis    Scoliosis    Thrombocytopenia    Venous insufficiency    legs   Past Surgical History:  Procedure Laterality Date   CATARACT EXTRACTION Bilateral    KNEE ARTHROSCOPY     KNEE ARTHROSCOPY WITH MEDIAL MENISECTOMY Right 01/09/2020   Procedure: RIGHT KNEE ARTHROSCOPY WITH MEDIAL MENISECTOMY;  Surgeon: Marchia Drivers, MD;  Location: [facility];  Service: Orthopedics;  Laterality: Right;   Oral biopsy     PILONIDAL CYST EXCISION     PROSTATE BIOPSY     Negative   SEPTOPLASTY     SHOULDER ARTHROSCOPY WITH ROTATOR CUFF REPAIR AND SUBACROMIAL DECOMPRESSION Right 07/02/2020   Procedure: RIGHT SHOULDER ARTHROSCOPY  DISTAL CLAVICLE EXCISION, BICEPS TENODESIS, SUBACROMIAL DECOMPRESSION, ANTERIOR LABRAL REPAIR. AND MINI OPEN ROTATOR CUFF REPAIR;  Surgeon: Krasinski, Kevin, MD;  Location: [facility];  Service: Orthopedics;  Laterality: Right;   TONSILLECTOMY     Patient Active Problem List   Diagnosis Date Noted   Umbilical hernia without obstruction and  without gangrene 11/10/2023   Parkinsonian features 11/09/2023   Hip pain 11/03/2023   Left sided sciatica 05/26/2023   Change in stool 04/28/2023   Medicare annual wellness visit, subsequent 11/29/2022   Tremor 11/29/2022   GERD (gastroesophageal reflux disease) 03/15/2022   Irregular heart rhythm 11/05/2021   White coat syndrome with high blood pressure without hypertension 11/05/2021   Paresthesia 11/10/2020   Erectile dysfunction 11/10/2020   PSA elevation 11/10/2020   Osteoarthritis of right knee 06/21/2020   Synovitis of knee 06/21/2020   Partial thickness rotator cuff tear 06/21/2020   Knee pain 11/12/2019   Actinic keratosis 11/12/2019   Scoliosis 01/09/2019   Advance care planning 12/28/2018   Healthcare maintenance 12/28/2018   Venous insufficiency    Thrombocytopenia    Prostatitis    Paroxysmal atrial fibrillation (HCC)    Hypothyroidism    History of oropharyngeal cancer 10/03/2018   Essential hypertension 08/12/2015   Palpitations 08/12/2015    ONSET DATE: 09/28/23 (referral date)    REFERRING DIAG: hypophonia  THERAPY DIAG:  Hypophonia  Dysphonia  Rationale for Evaluation and Treatment Rehabilitation  SUBJECTIVE:   SUBJECTIVE STATEMENT: Pt alert, pleasant, and cooperative Pt accompanied by: self  PERTINENT HISTORY & DIAGNOSTIC FINDINGS:  Pt is an 83 y.o. 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 DaTSCAN , 01/12/23, Bilateral mild decreased putamen activity is a pattern suggestive of  Parkinsonian syndrome pathology. Larygoscopy, 05/13/23, Given vocal cords moving but with bowing seen by laryngoscopy, and I suspect it is contributing to the hoarseness and breathiness, I let him know that we could refer him to a laryngologist, he would like to complete speech therapy first to see if laryngology would be necessary. Pt had course of ST 04/13/23-05/27/23 with improved vocal quality.   PAIN:  Are you having pain?  No  FALLS: Has patient fallen in last 6 months?  No  LIVING ENVIRONMENT: Lives with: lives with their spouse Lives in: House/apartment  PLOF:  Level of assistance: Independent with ADLs Employment: Retired  PATIENT GOALS    to improve communication in a variety of settings  OBJECTIVE:    TODAY'S TREATMENT:   Pt endorsed completing EMST, 5 sets of 5 breaths daily. Pt to continue for HEP for 5x/week. Pt completed x5 sets of x5 breaths with extra time.  Discussed performing EMST prior to [facility] ex's to bring awareness to breathing.  Reviewed Phonation Resistance Training Exercises (sustained phonation, ascending/descending glides) and pt completed without cues. Pt also completed resonant voice therapy task (may me my moe moo) without cues. Biofeedback utilized via Secondary school teacher. Pt performed all ex's ~84-85 dB. Pt participated in conversation ~15 minutes at >75dB. Evidence of mild volume and vocal quality decay during conversation continues, but loudness was functional this date.       PATIENT EDUCATION: Education details: as above Person educated: Patient Education method: Explanation Education comprehension: verbalized understanding and needs further education       HOME EXERCISE PROGRAM:  Pushing/pulling ex's  EMST  Phorte ex's   GOALS: Goals reviewed with patient? Yes  SHORT TERM GOALS: Target date: 10 sessions  Pt will complete pushing/pulling exercises to improve vocal cord adduction x10 reps with no more than min cueing. Baseline: Goal status: MET  2.  The patient will complete HEP (Maximum duration ah, High/Lows, and Functional Phrases) at average loudness >/= 80 dB and with loud, good quality voice without reports of strain. Baseline:  Goal status: PROGRESSING  3.  The patient will complete Hierarchical Speech Loudness reading drills (words/phrases, sentences) at average >/= 75 dB and with loud, good quality voice without reports of  strain. Baseline:  Goal status: MET  4.  The patient will participate in 5-8 minutes conversation, maintaining average loudness of 75 dB and good quality voice with modified independence.  Baseline:  Goal status: PROGRESSING  5. Pt will report completing EMST for improve breath support 5x/week (may be completed via combination of at home and in tx sessions).  Baseline:  Goal Status: MET      LONG TERM GOALS: Target date: 12 weeks   The patient will complete HEP (Maximum duration ah, High/Lows, and Functional Phrases) at average loudness >/= 80 dB and with loud, good quality voice indep without reports of strain. Baseline:  Goal status: PROGRESSING    The patient will participate in 15-20 minutes conversation, maintaining average loudness of 75 dB and good quality voice with modified independence.  Baseline:  Goal status: PROGRESSING  4. Pt will report improvement in communication per PROM. Baseline:  Goal status: PROGRESSING  ASSESSMENT:  CLINICAL IMPRESSION: Patient is a 83 y.o. 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 Pt known well to SLP services and received ST  04/13/23-05/27/23. 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 Pt reports periods of aphonia and general decline in voice since ending ST sessions. Pt presents with s/sx dysphonia and hypophonia. Voice is c/b auditory perceptual characteristics of hoarse/strained, occasionally breathy voice, and periods of aphonia. Pt with tendency to speak on residual capacity and utilize thoracic and clavicular breathing patterns. Pt stimulable to clear speech techniques (e.g. speak loud and slow) as well as pushing/pulling to improve vocal cord adduction. Pt eager to resume ST sessions to improve communication at both home and in the community. See details of today's tx  session above. Recommend course of ST with an intensity based approach.  OBJECTIVE IMPAIRMENTS include voice disorder. These impairments are limiting patient from effectively communicating at home and in community. Factors affecting potential to achieve goals and functional outcome are co-morbidities. Patient will benefit from skilled SLP services to address above impairments and improve overall function.  REHAB POTENTIAL: Good  PLAN: SLP FREQUENCY: 1-2x/week  SLP DURATION: 12 weeks  PLANNED INTERVENTIONS: Environmental controls, Cueing hierachy, Internal/external aids, Functional tasks, Multimodal communication approach, SLP instruction and feedback, Compensatory strategies, and Patient/family education    Delon Bangs, M.S., CCC-SLP Speech-Language Pathologist [place] [facility] [phone] [place])  [facility] Outpatient Rehabilitation at [facility] [address] [place], [place], [address] Phone: [phone]   Fax:  [phone]

## 2023-12-04 ENCOUNTER — Encounter: Payer: Self-pay | Admitting: Family Medicine

## 2023-12-07 ENCOUNTER — Ambulatory Visit

## 2023-12-09 ENCOUNTER — Ambulatory Visit

## 2023-12-14 ENCOUNTER — Ambulatory Visit

## 2023-12-16 ENCOUNTER — Ambulatory Visit

## 2023-12-20 ENCOUNTER — Encounter

## 2023-12-21 ENCOUNTER — Ambulatory Visit

## 2023-12-21 DIAGNOSIS — R498 Other voice and resonance disorders: Secondary | ICD-10-CM | POA: Diagnosis not present

## 2023-12-21 DIAGNOSIS — R490 Dysphonia: Secondary | ICD-10-CM

## 2023-12-21 NOTE — Therapy (Signed)
 OUTPATIENT SPEECH LANGUAGE PATHOLOGY VOICE TREATMENT   Patient Name: Norman Mitchell MRN: [id number] DOB:Jan 22, 1941, 83 y.o., male Today's Date: 12/21/2023  PCP: Arlyss Solian, MD  REFERRING PROVIDER: Asberry Schneider, DO   End of Session - 12/21/23 1533     Visit Number 16    Number of Visits 24    Date for Recertification  12/29/23    Progress Note Due on Visit 20    SLP Start Time 1400    SLP Stop Time  1445    SLP Time Calculation (min) 45 min    Activity Tolerance Patient tolerated treatment well          Past Medical History:  Diagnosis Date   Arthritis    Hands, knee   Back pain    Complication of anesthesia    Was told after 1 surgery that his HR went really low.  Says his normal HR is 50-55.   DVT (deep venous thrombosis) (HCC) 2017   Bilateral knees.  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   Hypothyroidism    Oral cancer (HCC)    Status post radiation.   Paroxysmal atrial fibrillation (HCC)    Prostatitis    Scoliosis    Thrombocytopenia    Venous insufficiency    legs   Past Surgical History:  Procedure Laterality Date   CATARACT EXTRACTION Bilateral    KNEE ARTHROSCOPY     KNEE ARTHROSCOPY WITH MEDIAL MENISECTOMY Right 01/09/2020   Procedure: RIGHT KNEE ARTHROSCOPY WITH MEDIAL MENISECTOMY;  Surgeon: Marchia Drivers, MD;  Location: [facility];  Service: Orthopedics;  Laterality: Right;   Oral biopsy     PILONIDAL CYST EXCISION     PROSTATE BIOPSY     Negative   SEPTOPLASTY     SHOULDER ARTHROSCOPY WITH ROTATOR CUFF REPAIR AND SUBACROMIAL DECOMPRESSION Right 07/02/2020   Procedure: RIGHT SHOULDER ARTHROSCOPY  DISTAL CLAVICLE EXCISION, BICEPS TENODESIS, SUBACROMIAL DECOMPRESSION, ANTERIOR LABRAL REPAIR. AND MINI OPEN ROTATOR CUFF REPAIR;  Surgeon: Krasinski, Kevin, MD;  Location: [facility];  Service: Orthopedics;  Laterality: Right;   TONSILLECTOMY     Patient Active Problem List   Diagnosis Date Noted   Umbilical hernia without obstruction and  without gangrene 11/10/2023   Parkinsonian features 11/09/2023   Hip pain 11/03/2023   Left sided sciatica 05/26/2023   Change in stool 04/28/2023   Medicare annual wellness visit, subsequent 11/29/2022   Tremor 11/29/2022   GERD (gastroesophageal reflux disease) 03/15/2022   Irregular heart rhythm 11/05/2021   White coat syndrome with high blood pressure without hypertension 11/05/2021   Paresthesia 11/10/2020   Erectile dysfunction 11/10/2020   PSA elevation 11/10/2020   Osteoarthritis of right knee 06/21/2020   Synovitis of knee 06/21/2020   Partial thickness rotator cuff tear 06/21/2020   Knee pain 11/12/2019   Actinic keratosis 11/12/2019   Scoliosis 01/09/2019   Advance care planning 12/28/2018   Healthcare maintenance 12/28/2018   Venous insufficiency    Thrombocytopenia    Prostatitis    Paroxysmal atrial fibrillation (HCC)    Hypothyroidism    History of oropharyngeal cancer 10/03/2018   Essential hypertension 08/12/2015   Palpitations 08/12/2015    ONSET DATE: 09/28/23 (referral date)    REFERRING DIAG: hypophonia  THERAPY DIAG:  Hypophonia  Dysphonia  Rationale for Evaluation and Treatment Rehabilitation  SUBJECTIVE:   SUBJECTIVE STATEMENT: Pt alert, pleasant, and cooperative Pt accompanied by: self  PERTINENT HISTORY & DIAGNOSTIC FINDINGS:  Pt is an 83 y.o. 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 DaTSCAN , 01/12/23, Bilateral mild decreased putamen activity is a pattern suggestive of  Parkinsonian syndrome pathology. Larygoscopy, 05/13/23, Given vocal cords moving but with bowing seen by laryngoscopy, and I suspect it is contributing to the hoarseness and breathiness, I let him know that we could refer him to a laryngologist, he would like to complete speech therapy first to see if laryngology would be necessary. Pt had course of ST 04/13/23-05/27/23 with improved vocal quality.   PAIN:  Are you having pain?  No  FALLS: Has patient fallen in last 6 months?  No  LIVING ENVIRONMENT: Lives with: lives with their spouse Lives in: House/apartment  PLOF:  Level of assistance: Independent with ADLs Employment: Retired  PATIENT GOALS    to improve communication in a variety of settings  OBJECTIVE:    TODAY'S TREATMENT:   Pt endorsed completing EMST, 5 sets of 5 breaths daily. Pt to continue for HEP for 5x/week. Pt completed x5 sets of x5 breaths with extra time.  Discussed performing EMST prior to [facility] ex's to bring awareness to breathing.  Reviewed Phonation Resistance Training Exercises (sustained phonation, ascending/descending glides) and pt completed with min cues. Pt participated in conversation ~10 minutes at >75dB. Loudness remained functional; however, vocal quality decompensation evident.          PATIENT EDUCATION: Education details: as above Person educated: Patient Education method: Explanation Education comprehension: verbalized understanding and needs further education       HOME EXERCISE PROGRAM:  Pushing/pulling ex's  EMST  Phorte ex's   GOALS: Goals reviewed with patient? Yes  SHORT TERM GOALS: Target date: 10 sessions  Pt will complete pushing/pulling exercises to improve vocal cord adduction x10 reps with no more than min cueing. Baseline: Goal status: MET  2.  The patient will complete HEP (Maximum duration ah, High/Lows, and Functional Phrases) at average loudness >/= 80 dB and with loud, good quality voice without reports of strain. Baseline:  Goal status: PROGRESSING  3.  The patient will complete Hierarchical Speech Loudness reading drills (words/phrases, sentences) at average >/= 75 dB and with loud, good quality voice without reports of strain. Baseline:  Goal status: MET  4.  The patient will participate in 5-8 minutes conversation, maintaining average loudness of 75 dB and good quality voice with modified independence.  Baseline:  Goal  status: PROGRESSING  5. Pt will report completing EMST for improve breath support 5x/week (may be completed via combination of at home and in tx sessions).  Baseline:  Goal Status: MET      LONG TERM GOALS: Target date: 12 weeks   The patient will complete HEP (Maximum duration ah, High/Lows, and Functional Phrases) at average loudness >/= 80 dB and with loud, good quality voice indep without reports of strain. Baseline:  Goal status: PROGRESSING    The patient will participate in 15-20 minutes conversation, maintaining average loudness of 75 dB and good quality voice with modified independence.  Baseline:  Goal status: PROGRESSING  4. Pt will report improvement in communication per PROM. Baseline:  Goal status: PROGRESSING  ASSESSMENT:  CLINICAL IMPRESSION: Patient is a 83 y.o. 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 Pt known well to SLP services and received ST 04/13/23-05/27/23. 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 Pt reports periods of aphonia and general decline  in voice since ending ST sessions. Pt presents with s/sx dysphonia and hypophonia. Voice is c/b auditory perceptual characteristics of hoarse/strained, occasionally breathy voice, and periods of aphonia. Pt with tendency to speak on residual capacity and utilize thoracic and clavicular breathing patterns. Pt stimulable to clear speech techniques (e.g. speak loud and slow) as well as pushing/pulling to improve vocal cord adduction. Pt eager to resume ST sessions to improve communication at both home and in the community. See details of today's tx session above. Recommend course of ST with an intensity based approach.  OBJECTIVE IMPAIRMENTS include voice disorder. These impairments are limiting patient from effectively communicating at home and in  community. Factors affecting potential to achieve goals and functional outcome are co-morbidities. Patient will benefit from skilled SLP services to address above impairments and improve overall function.  REHAB POTENTIAL: Good  PLAN: SLP FREQUENCY: 1-2x/week  SLP DURATION: 12 weeks  PLANNED INTERVENTIONS: Environmental controls, Cueing hierachy, Internal/external aids, Functional tasks, Multimodal communication approach, SLP instruction and feedback, Compensatory strategies, and Patient/family education    Delon Bangs, M.S., CCC-SLP Speech-Language Pathologist [place] [facility] [phone] [place])  [facility] Out[facility] Rehabilitation at [facility] [address] [place], [place], [address] Phone: [phone]   Fax:  [phone]

## 2023-12-23 ENCOUNTER — Ambulatory Visit

## 2023-12-23 DIAGNOSIS — R498 Other voice and resonance disorders: Secondary | ICD-10-CM

## 2023-12-23 DIAGNOSIS — R490 Dysphonia: Secondary | ICD-10-CM

## 2023-12-23 NOTE — Therapy (Signed)
 OUTPATIENT SPEECH LANGUAGE PATHOLOGY VOICE TREATMENT   Patient Name: Norman Mitchell MRN: [id number] DOB:[birth year], 83 y.o., male Today's Date: 12/23/2023  PCP: Arlyss Solian, MD  REFERRING PROVIDER: Asberry Schneider, DO   End of Session - 12/23/23 1149     Visit Number 17    Number of Visits 24    Date for Recertification  12/29/23    Progress Note Due on Visit 20    SLP Start Time 1015    SLP Stop Time  1100    SLP Time Calculation (min) 45 min    Activity Tolerance Patient tolerated treatment well          Past Medical History:  Diagnosis Date   Arthritis    Hands, knee   Back pain    Complication of anesthesia    Was told after 1 surgery that his HR went really low.  Says his normal HR is 50-55.   DVT (deep venous thrombosis) (HCC) 2017   Bilateral knees.  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   Hypothyroidism    Oral cancer (HCC)    Status post radiation.   Paroxysmal atrial fibrillation (HCC)    Prostatitis    Scoliosis    Thrombocytopenia    Venous insufficiency    legs   Past Surgical History:  Procedure Laterality Date   CATARACT EXTRACTION Bilateral    KNEE ARTHROSCOPY     KNEE ARTHROSCOPY WITH MEDIAL MENISECTOMY Right 01/09/2020   Procedure: RIGHT KNEE ARTHROSCOPY WITH MEDIAL MENISECTOMY;  Surgeon: Marchia Drivers, MD;  Location: [facility];  Service: Orthopedics;  Laterality: Right;   Oral biopsy     PILONIDAL CYST EXCISION     PROSTATE BIOPSY     Negative   SEPTOPLASTY     SHOULDER ARTHROSCOPY WITH ROTATOR CUFF REPAIR AND SUBACROMIAL DECOMPRESSION Right 07/02/2020   Procedure: RIGHT SHOULDER ARTHROSCOPY  DISTAL CLAVICLE EXCISION, BICEPS TENODESIS, SUBACROMIAL DECOMPRESSION, ANTERIOR LABRAL REPAIR. AND MINI OPEN ROTATOR CUFF REPAIR;  Surgeon: Krasinski, Kevin, MD;  Location: [facility];  Service: Orthopedics;  Laterality: Right;   TONSILLECTOMY     Patient Active Problem List   Diagnosis Date Noted   Umbilical hernia without obstruction and  without gangrene 11/10/2023   Parkinsonian features 11/09/2023   Hip pain 11/03/2023   Left sided sciatica 05/26/2023   Change in stool 04/28/2023   Medicare annual wellness visit, subsequent 11/29/2022   Tremor 11/29/2022   GERD (gastroesophageal reflux disease) 03/15/2022   Irregular heart rhythm 11/05/2021   White coat syndrome with high blood pressure without hypertension 11/05/2021   Paresthesia 11/10/2020   Erectile dysfunction 11/10/2020   PSA elevation 11/10/2020   Osteoarthritis of right knee 06/21/2020   Synovitis of knee 06/21/2020   Partial thickness rotator cuff tear 06/21/2020   Knee pain 11/12/2019   Actinic keratosis 11/12/2019   Scoliosis 01/09/2019   Advance care planning 12/28/2018   Healthcare maintenance 12/28/2018   Venous insufficiency    Thrombocytopenia    Prostatitis    Paroxysmal atrial fibrillation (HCC)    Hypothyroidism    History of oropharyngeal cancer 10/03/2018   Essential hypertension 08/12/2015   Palpitations 08/12/2015    ONSET DATE: 09/28/23 (referral date)    REFERRING DIAG: hypophonia  THERAPY DIAG:  Hypophonia  Dysphonia  Rationale for Evaluation and Treatment Rehabilitation  SUBJECTIVE:   SUBJECTIVE STATEMENT: Pt alert, pleasant, and cooperative Pt accompanied by: self  PERTINENT HISTORY & DIAGNOSTIC FINDINGS:  Pt is an 83 y.o. 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 DaTSCAN , 01/12/23, Bilateral mild decreased putamen activity is a pattern suggestive of  Parkinsonian syndrome pathology. Larygoscopy, 05/13/23, Given vocal cords moving but with bowing seen by laryngoscopy, and I suspect it is contributing to the hoarseness and breathiness, I let him know that we could refer him to a laryngologist, he would like to complete speech therapy first to see if laryngology would be necessary. Pt had course of ST 04/13/23-05/27/23 with improved vocal quality.   PAIN:  Are you having pain?  No  FALLS: Has patient fallen in last 6 months?  No  LIVING ENVIRONMENT: Lives with: lives with their spouse Lives in: House/apartment  PLOF:  Level of assistance: Independent with ADLs Employment: Retired  PATIENT GOALS    to improve communication in a variety of settings  OBJECTIVE:    TODAY'S TREATMENT:   Pt endorsed completing EMST, 5 sets of 5 breaths daily. Pt to continue for HEP for 5x/week. Pt completed x5 sets of x5 breaths with extra time.  Discussed performing EMST prior to [facility] ex's to bring awareness to breathing.  Reviewed Phonation Resistance Training Exercises (sustained phonation, ascending/descending glides) and pt completed with min cues. Pt participated in conversation ~10 minutes at >75dB. Loudness remained functional; however, mild vocal quality decompensation evident with increased breathiness/hoarsenss. Improvement in vocal quality with pulling ex's (weight bicep curls with counting aloud) and immediately following.          PATIENT EDUCATION: Education details: as above Person educated: Patient Education method: Explanation Education comprehension: verbalized understanding and needs further education       HOME EXERCISE PROGRAM:  Pushing/pulling ex's  EMST  Phorte ex's   GOALS: Goals reviewed with patient? Yes  SHORT TERM GOALS: Target date: 10 sessions  Pt will complete pushing/pulling exercises to improve vocal cord adduction x10 reps with no more than min cueing. Baseline: Goal status: MET  2.  The patient will complete HEP (Maximum duration ah, High/Lows, and Functional Phrases) at average loudness >/= 80 dB and with loud, good quality voice without reports of strain. Baseline:  Goal status: PROGRESSING  3.  The patient will complete Hierarchical Speech Loudness reading drills (words/phrases, sentences) at average >/= 75 dB and with loud, good quality voice without reports of strain. Baseline:  Goal status: MET  4.  The  patient will participate in 5-8 minutes conversation, maintaining average loudness of 75 dB and good quality voice with modified independence.  Baseline:  Goal status: PROGRESSING  5. Pt will report completing EMST for improve breath support 5x/week (may be completed via combination of at home and in tx sessions).  Baseline:  Goal Status: MET      LONG TERM GOALS: Target date: 12 weeks   The patient will complete HEP (Maximum duration ah, High/Lows, and Functional Phrases) at average loudness >/= 80 dB and with loud, good quality voice indep without reports of strain. Baseline:  Goal status: PROGRESSING    The patient will participate in 15-20 minutes conversation, maintaining average loudness of 75 dB and good quality voice with modified independence.  Baseline:  Goal status: PROGRESSING  4. Pt will report improvement in communication per PROM. Baseline:  Goal status: PROGRESSING  ASSESSMENT:  CLINICAL IMPRESSION: Patient is a 83 y.o. 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 Pt known well to SLP services and received ST 04/13/23-05/27/23. 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 Pt reports periods of aphonia and general decline in voice since ending ST sessions. Pt presents with s/sx dysphonia and hypophonia. Voice is c/b auditory perceptual characteristics of hoarse/strained, occasionally breathy voice, and periods of aphonia. Pt with tendency to speak on residual capacity and utilize thoracic and clavicular breathing patterns. Pt stimulable to clear speech techniques (e.g. speak loud and slow) as well as pushing/pulling to improve vocal cord adduction. Pt eager to resume ST sessions to improve communication at both home and in the community. See details of today's tx session above. Recommend course of ST with an  intensity based approach.  OBJECTIVE IMPAIRMENTS include voice disorder. These impairments are limiting patient from effectively communicating at home and in community. Factors affecting potential to achieve goals and functional outcome are co-morbidities. Patient will benefit from skilled SLP services to address above impairments and improve overall function.  REHAB POTENTIAL: Good  PLAN: SLP FREQUENCY: 1-2x/week  SLP DURATION: 12 weeks  PLANNED INTERVENTIONS: Environmental controls, Cueing hierachy, Internal/external aids, Functional tasks, Multimodal communication approach, SLP instruction and feedback, Compensatory strategies, and Patient/family education    Delon Bangs, M.S., CCC-SLP Speech-Language Pathologist [place] [facility] [phone] [place])  [facility] Out[facility] Rehabilitation at [facility] [address] [place], [place], [address] Phone: [phone]   Fax:  [phone]

## 2023-12-24 ENCOUNTER — Encounter: Payer: Self-pay | Admitting: Family Medicine

## 2023-12-24 NOTE — Telephone Encounter (Signed)
 Patient seen by Lavella Eric PT - 12/03/2023 [facility]   See notes scanned in Epic

## 2023-12-27 ENCOUNTER — Ambulatory Visit

## 2023-12-27 DIAGNOSIS — R498 Other voice and resonance disorders: Secondary | ICD-10-CM

## 2023-12-27 DIAGNOSIS — R490 Dysphonia: Secondary | ICD-10-CM

## 2023-12-27 NOTE — Therapy (Signed)
 OUTPATIENT SPEECH LANGUAGE PATHOLOGY VOICE TREATMENT / DISCHARGE SUMMARY   Norman Mitchell Name: Norman Mitchell MRN: [id number] DOB:[birth year], 83 y.o., male Today's Date: 12/27/2023  PCP: Arlyss Solian, MD  REFERRING PROVIDER: Asberry Schneider, DO   End of Session - 12/27/23 1152     Visit Number 18    Number of Visits 24    Date for Recertification  12/29/23    Progress Note Due on Visit 20    SLP Start Time 1100    SLP Stop Time  1145    SLP Time Calculation (min) 45 min    Activity Tolerance Norman Mitchell tolerated treatment well          Past Medical History:  Diagnosis Date   Arthritis    Hands, knee   Back pain    Complication of anesthesia    Was told after 1 surgery that his HR went really low.  Says his normal HR is 50-55.   DVT (deep venous thrombosis) (HCC) 2017   Bilateral knees.  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   Hypothyroidism    Oral cancer (HCC)    Status post radiation.   Paroxysmal atrial fibrillation (HCC)    Prostatitis    Scoliosis    Thrombocytopenia    Venous insufficiency    legs   Past Surgical History:  Procedure Laterality Date   CATARACT EXTRACTION Bilateral    KNEE ARTHROSCOPY     KNEE ARTHROSCOPY WITH MEDIAL MENISECTOMY Right 01/09/2020   Procedure: RIGHT KNEE ARTHROSCOPY WITH MEDIAL MENISECTOMY;  Surgeon: Marchia Drivers, MD;  Location: [facility];  Service: Orthopedics;  Laterality: Right;   Oral biopsy     PILONIDAL CYST EXCISION     PROSTATE BIOPSY     Negative   SEPTOPLASTY     SHOULDER ARTHROSCOPY WITH ROTATOR CUFF REPAIR AND SUBACROMIAL DECOMPRESSION Right 07/02/2020   Procedure: RIGHT SHOULDER ARTHROSCOPY  DISTAL CLAVICLE EXCISION, BICEPS TENODESIS, SUBACROMIAL DECOMPRESSION, ANTERIOR LABRAL REPAIR. AND MINI OPEN ROTATOR CUFF REPAIR;  Surgeon: Krasinski, Kevin, MD;  Location: [facility];  Service: Orthopedics;  Laterality: Right;   TONSILLECTOMY     Norman Mitchell Active Problem List   Diagnosis Date Noted   Umbilical hernia  without obstruction and without gangrene 11/10/2023   Parkinsonian features 11/09/2023   Hip pain 11/03/2023   Left sided sciatica 05/26/2023   Change in stool 04/28/2023   Medicare annual wellness visit, subsequent 11/29/2022   Tremor 11/29/2022   GERD (gastroesophageal reflux disease) 03/15/2022   Irregular heart rhythm 11/05/2021   White coat syndrome with high blood pressure without hypertension 11/05/2021   Paresthesia 11/10/2020   Erectile dysfunction 11/10/2020   PSA elevation 11/10/2020   Osteoarthritis of right knee 06/21/2020   Synovitis of knee 06/21/2020   Partial thickness rotator cuff tear 06/21/2020   Knee pain 11/12/2019   Actinic keratosis 11/12/2019   Scoliosis 01/09/2019   Advance care planning 12/28/2018   Healthcare maintenance 12/28/2018   Venous insufficiency    Thrombocytopenia    Prostatitis    Paroxysmal atrial fibrillation (HCC)    Hypothyroidism    History of oropharyngeal cancer 10/03/2018   Essential hypertension 08/12/2015   Palpitations 08/12/2015    ONSET DATE: 09/28/23 (referral date)    REFERRING DIAG: hypophonia  THERAPY DIAG:  Hypophonia  Dysphonia  Rationale for Evaluation and Treatment Rehabilitation  SUBJECTIVE:   SUBJECTIVE STATEMENT: Norman Mitchell alert, pleasant, and cooperative Norman Mitchell accompanied by: self  PERTINENT HISTORY & DIAGNOSTIC FINDINGS:  Norman Mitchell is an 83 y.o. 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 Norman Mitchell , 01/12/23, Bilateral mild decreased putamen activity is a pattern suggestive of  Parkinsonian syndrome pathology. Larygoscopy, 05/13/23, Given vocal cords moving but with bowing seen by laryngoscopy, and I suspect it is contributing to the hoarseness and breathiness, I let him know that we could refer him to a laryngologist, he would like to complete speech therapy first to see if laryngology would be necessary. Norman Mitchell had course of ST 04/13/23-05/27/23 with improved vocal quality.   PAIN:  Are you  having pain? No  FALLS: Has Norman Mitchell fallen in last 6 months?  No  LIVING ENVIRONMENT: Lives with: lives with their spouse Lives in: House/apartment  PLOF:  Level of assistance: Independent with ADLs Employment: Retired  Norman Mitchell GOALS    to improve communication in a variety of settings  OBJECTIVE:    TODAY'S TREATMENT:   Reviewed HEP for voice including EMST and PHORTE ex's. Norman Mitchell to continue at d/c.   Norman Mitchell participated in conversation ~15 minutes at >75dB. Loudness remained functional on average; however, moderate vocal quality decompensation continues with hoarse and breathy vocal quality. Norman Mitchell continues to endorse fluctuations in vocal quality throughout the day which has been observed in several SLP sessions.   Norman Mitchell continues to be highly motivated to improve vocal quality. Discussed course of ST. Norman Mitchell planning to be evaluation by laryngologist to determine next steps. Given Norman Mitchell's motivation, Norman Mitchell would be a great candidate to resume ST, as indicated, following laryngology appointment.    VOICE HANDICAP INDEX (VHI)  The Voice Handicap Index is comprised of a series of questions to assess the Norman Mitchell's perception of their voice. It is designed to evaluate the emotional, physical and functional components of the voice problem.  Functional: 15/40 Physical: 21/40 Emotional: 4/40 Total: 40/120 *moderate        Norman Mitchell EDUCATION: Education details: as above Person educated: Norman Mitchell Education method: Explanation Education comprehension: verbalized understanding       HOME EXERCISE PROGRAM:  Pushing/pulling ex's  EMST  Phorte ex's   GOALS: Goals reviewed with Norman Mitchell? Yes  SHORT TERM GOALS: Target date: 10 sessions  Norman Mitchell will complete pushing/pulling exercises to improve vocal cord adduction x10 reps with no more than min cueing. Baseline: Goal status: MET  2.  The Norman Mitchell will complete HEP (Maximum duration ah, High/Lows, and Functional Phrases) at average loudness >/= 80  dB and with loud, good quality voice without reports of strain. Baseline:  Goal status: MET  3.  The Norman Mitchell will complete Hierarchical Speech Loudness reading drills (words/phrases, sentences) at average >/= 75 dB and with loud, good quality voice without reports of strain. Baseline:  Goal status: MET  4.  The Norman Mitchell will participate in 5-8 minutes conversation, maintaining average loudness of 75 dB and good quality voice with modified independence.  Baseline:  Goal status: PROGRESSING; adequate loudness, fluctuations in vocal quality persist  5. Norman Mitchell will report completing EMST for improve breath support 5x/week (may be completed via combination of at home and in tx sessions).  Baseline:  Goal Status: MET      LONG TERM GOALS: Target date: 12 weeks   The Norman Mitchell will complete HEP (Maximum duration ah, High/Lows, and Functional Phrases) at average loudness >/= 80 dB and with loud, good quality voice indep without reports of strain. Baseline:  Goal status: PROGRESSING; vocal quality fluctuations persist    The Norman Mitchell will participate in 15-20 minutes conversation, maintaining average loudness of 75 dB and good quality voice with modified independence.  Baseline:  Goal status: PROGRESSING; adequate loudness, fluctuations in vocal quality persist  4. Norman Mitchell will report improvement in communication per PROM. Baseline:  Goal status: NOT MET  ASSESSMENT:  CLINICAL IMPRESSION: Norman Mitchell is a 83 y.o. 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 Norman Mitchell known well to SLP services and received ST 04/13/23-05/27/23. 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 Norman Mitchell reports periods of aphonia and general decline in voice since ending ST sessions. Norman Mitchell has participated in second course of ST with use of EMST, PHORTE, and resonant voice  techniques. Despite Norman Mitchell's best efforts, P\Norman Mitchell presents with s/sx moderate dysphonia with fluctuates during the day with within a session.  Voice is c/b auditory perceptual characteristics of hoarse/strained, occasionally breathy voice, and periods of aphonia. Norman Mitchell has made improvement in vocal loudness which is likely due to EMST and education re: breath support. Norman Mitchell planning to f/u with laryngologist to determine next steps. See details of today's tx session above.     PLAN: D/C ST; Norman Mitchell welcome to resume after laryngology consultation, if indicated   Delon Bangs, M.S., CCC-SLP Speech-Language Pathologist [facility] [phone] [place])  [place] at [facility] [address] [place], [place], [address] Phone: [phone]   Fax:  [phone]

## 2023-12-28 ENCOUNTER — Encounter

## 2023-12-30 ENCOUNTER — Ambulatory Visit

## 2024-01-26 IMAGING — DX DG CHEST 2V
2 series · 2 of 2 positions shown · non-contrast
Comparison: None.

CLINICAL DATA: History of oropharyngeal carcinoma.

EXAM:
CHEST - 2 VIEW

[chest pa]
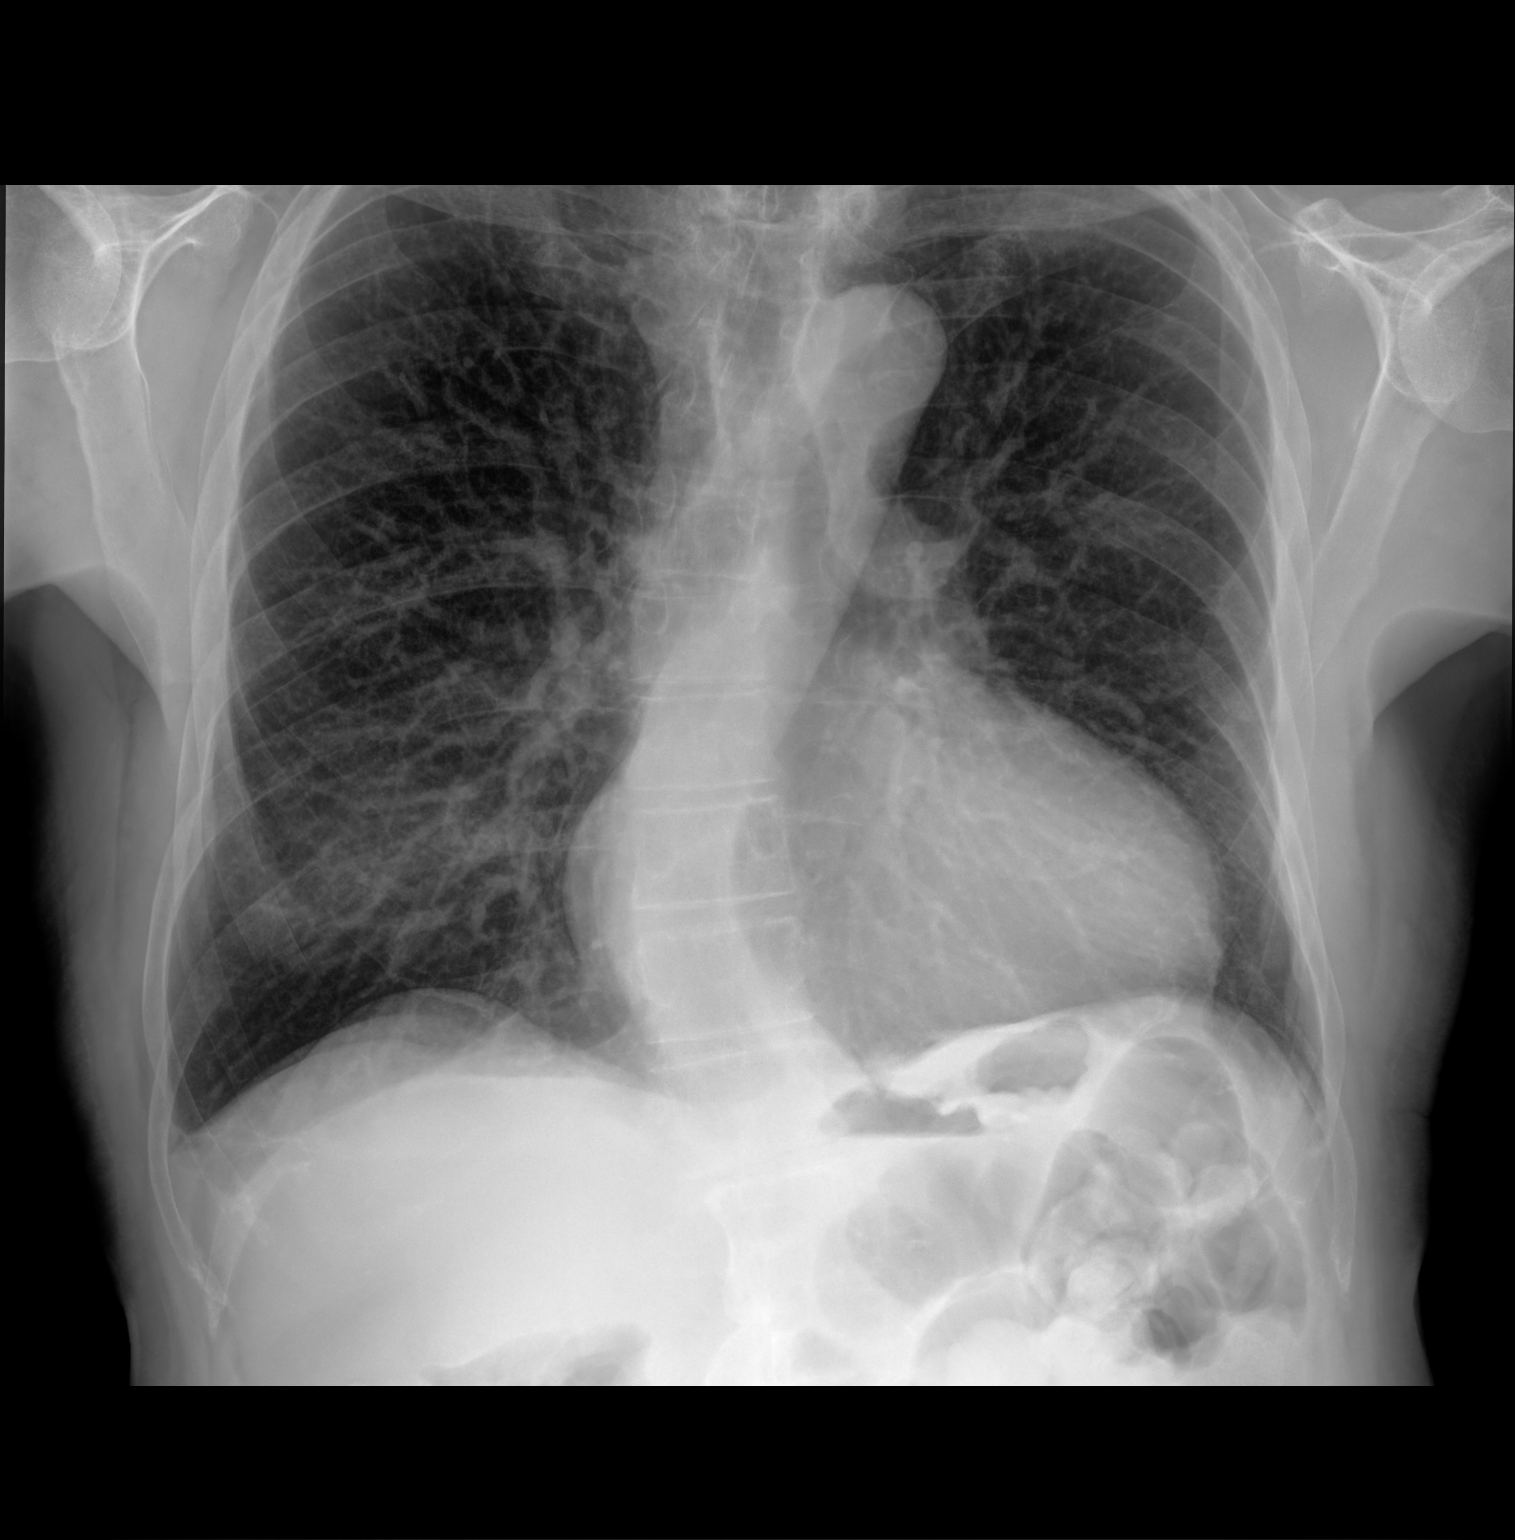

[chest lat]
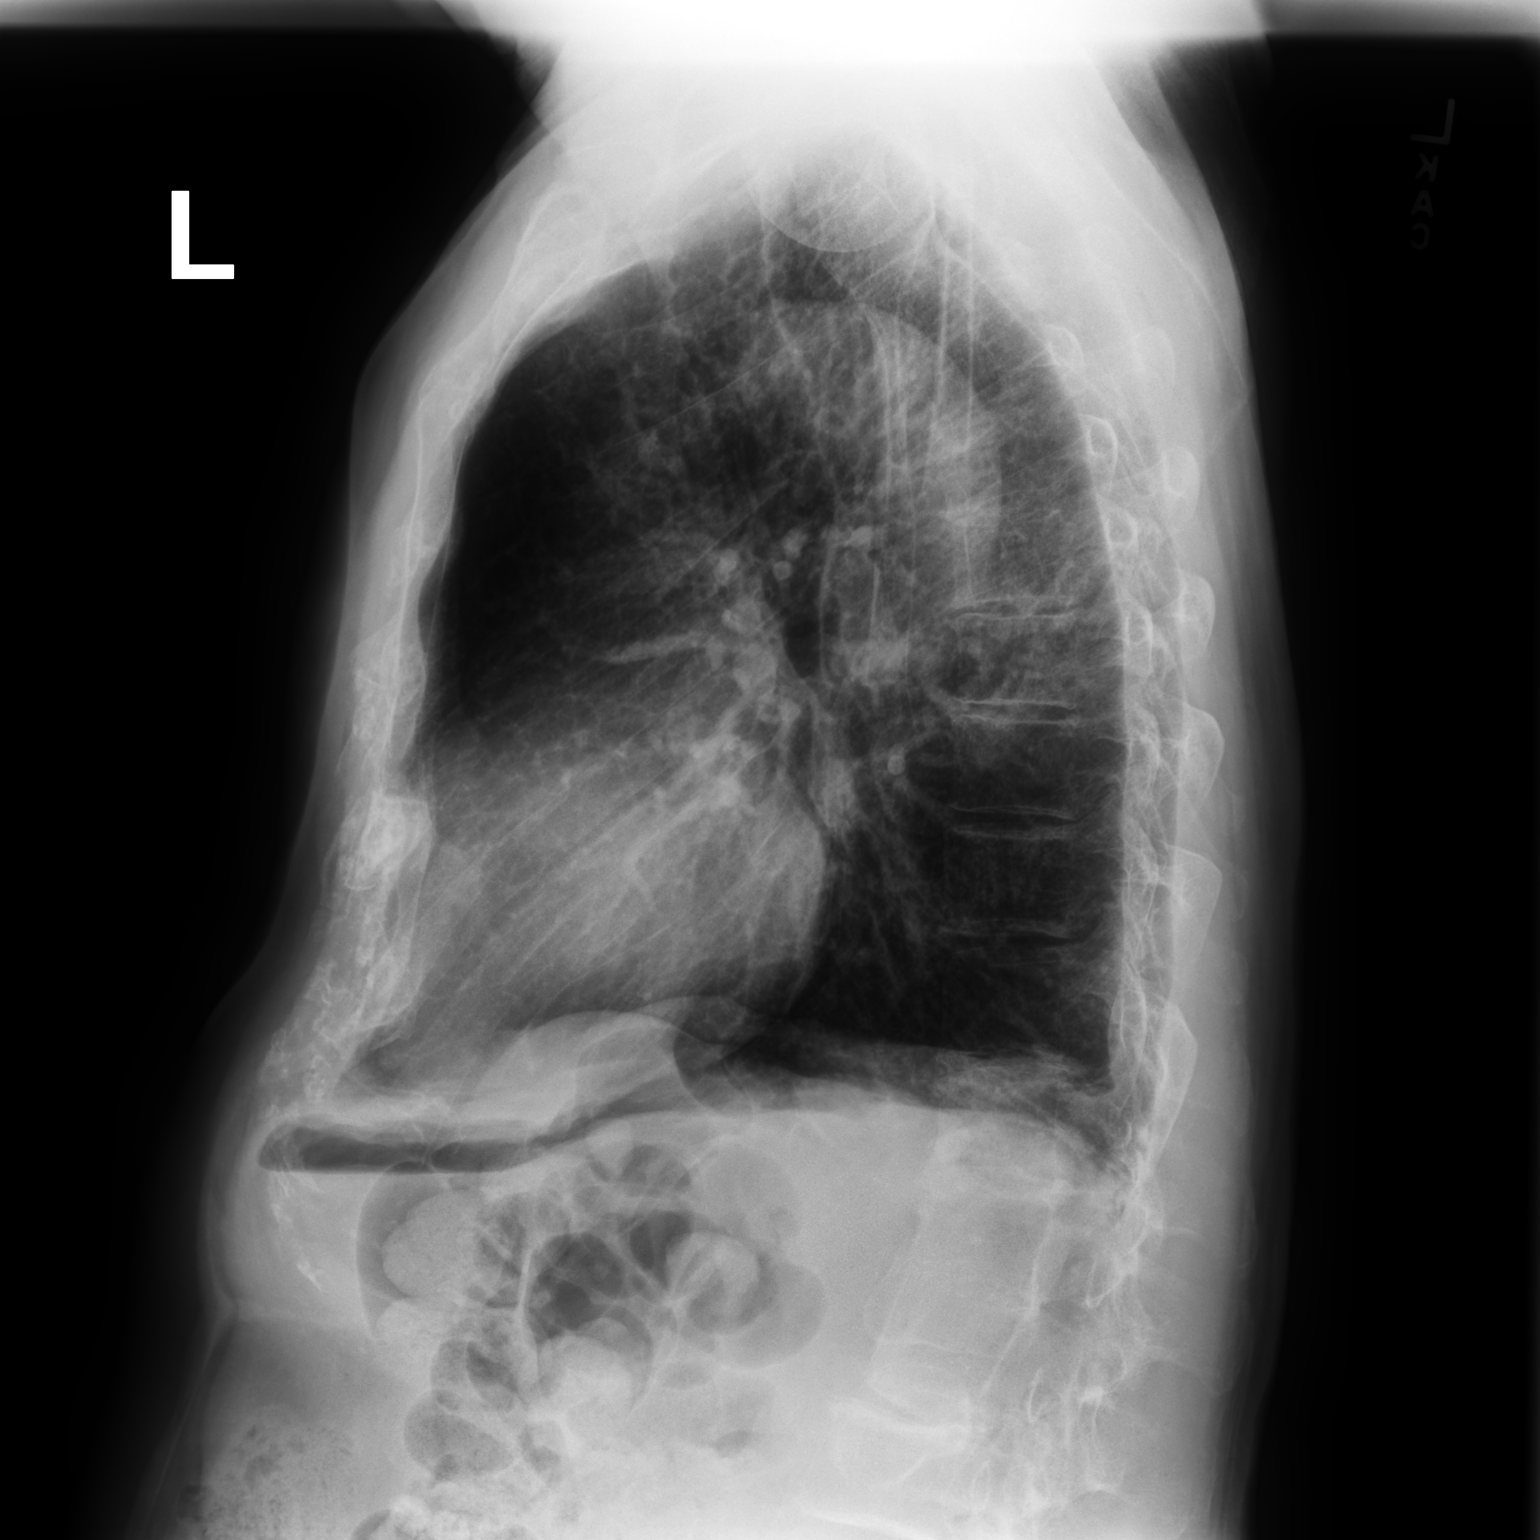

[2 of 2 positions shown; findings below may reference images not displayed]

FINDINGS: Cardiac contours upper limits of normal. Tortuosity of the thoracic
aorta. No large area pulmonary consolidation. No pleural effusion or
pneumothorax. Rightward curvature of the thoracic spine.
IMPRESSION: No active cardiopulmonary disease.

## 2024-02-10 ENCOUNTER — Ambulatory Visit: Admitting: Family Medicine

## 2024-02-10 VITALS — BP 138/88 | HR 54 | Temp 98.3°F | Ht 69.0 in | Wt 174.4 lb

## 2024-02-10 DIAGNOSIS — R066 Hiccough: Secondary | ICD-10-CM | POA: Diagnosis not present

## 2024-02-10 NOTE — Patient Instructions (Signed)
 See if taking pepcid /famotidine  helps.  If so, take for about 1 month.   Then try tapering slowly by 1 pill per week.  Update me as needed.  Take care.  Glad to see you.

## 2024-02-10 NOTE — Progress Notes (Unsigned)
 Hiccups noted after eating.  Worse with chocolate but can happen with mult foods.  Not with every meal.  Swallowing well.  Regurgitated once.  No blood in stools, no black stools.  No fevers, no chills.    D/w pt about ENT eval.  That is pending.   Meds, vitals, and allergies reviewed.   ROS: Per HPI unless specifically indicated in ROS section   Trace BLE edema.  OP wnl.

## 2024-02-13 DIAGNOSIS — R066 Hiccough: Secondary | ICD-10-CM | POA: Insufficient documentation

## 2024-02-13 NOTE — Assessment & Plan Note (Signed)
 Could be GERD related.  He can see if taking pepcid /famotidine  helps.  If so, take for about 1 month.   Then try tapering slowly by 1 pill per week.  Update me as needed.  He agrees.

## 2024-02-21 ENCOUNTER — Encounter: Payer: Self-pay | Admitting: Family Medicine

## 2024-02-21 NOTE — Telephone Encounter (Signed)
 Copied from CRM #[id number]. Topic: Clinical - Medical Advice >> Feb 21, 2024 11:15 AM Rea ORN wrote: Reason for CRM: Pt wife called to advise pt had chest pain last night while on vacation in [place] . Pt was taken to the hospital and kept overnight. The hospital advised they will be taking to [facility] for a cardiac cath this afternoon. Elieen stated will link the Mychart's so PCP can see what is going on.   Please call [phone] if there are any additional questions.

## 2024-02-22 ENCOUNTER — Telehealth: Payer: Self-pay | Admitting: Family Medicine

## 2024-02-22 ENCOUNTER — Encounter: Payer: Self-pay | Admitting: Family Medicine

## 2024-02-22 DIAGNOSIS — I2510 Atherosclerotic heart disease of native coronary artery without angina pectoris: Secondary | ICD-10-CM | POA: Insufficient documentation

## 2024-02-22 NOTE — Addendum Note (Signed)
 Addended by: CLEATUS ARLYSS RAMAN on: 02/22/2024 03:20 PM   Modules accepted: Orders

## 2024-02-22 NOTE — Telephone Encounter (Signed)
 Copied from CRM #[id number]. Topic: Referral - Question >> Feb 22, 2024  2:03 PM Norman Mitchell wrote: Reason for CRM: Patient had chest pains while in [facility] and wants to get a referral for a cardiologist.

## 2024-02-22 NOTE — Telephone Encounter (Signed)
 Referral placed.  MyChart message sent.  Thanks.

## 2024-02-25 ENCOUNTER — Telehealth: Payer: Self-pay | Admitting: Cardiology

## 2024-02-25 NOTE — Telephone Encounter (Signed)
 PT would like to do a provider switch from DR. Agbor to Dr. Gollan. Please advise.

## 2024-03-05 ENCOUNTER — Encounter: Payer: Self-pay | Admitting: Cardiovascular Disease

## 2024-03-05 DIAGNOSIS — E785 Hyperlipidemia, unspecified: Secondary | ICD-10-CM

## 2024-03-05 HISTORY — DX: Hyperlipidemia, unspecified: E78.5

## 2024-03-05 NOTE — Progress Notes (Unsigned)
 Cardiology Office Note  Date:  03/06/2024   ID:  Norman Mitchell, DOB 1940/12/12, MRN [id number]  PCP:  Cleatus Arlyss RAMAN, MD   Chief Complaint  Patient presents with   New Patient (Initial Visit)    Re-establish care for A-Fib/CAD/s/p recent cardiac cath & stent placement x 1 on 02/21/2024 in [place], [place] . Patient c/o shortness of breath more than before the procedure mostly at rest but with activity, patient states he's okay.    HPI:  Norman Mitchell is a 84 y.o. male with past medical history of: Past Medical History:  Diagnosis Date   Arthritis    Hands, knee   Back pain    Complication of anesthesia    Was told after 1 surgery that his HR went really low.  Says his normal HR is 50-55.   Coronary artery disease    DVT (deep venous thrombosis) (HCC) 2017   Bilateral knees.  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   Hyperlipidemia 03/05/2024   Hypothyroidism    Oral cancer (HCC)    Status post radiation.   Paroxysmal atrial fibrillation (HCC)    Prostatitis    Scoliosis    Thrombocytopenia    Venous insufficiency    legs  Parkinson's Coronary artery disease, non-STEMI 90% mid LAD lesion status post PCI with DES,  in [place]  PAF, details unclear History of DVT Who presents by referral from Dr. Arlyss Cleatus for coronary artery disease, non-STEMI  Followed by EP, history of sinus bradycardia, paroxysmal tachycardia Previously seen by Dr. Cindie Not on beta-blocker secondary to baseline bradycardia  Monitor June 2025 HR 42 to 135, average 55.  43 nonsustained SVT episodes, longest 15 seconds. Rhythm strip suggests  salvos of atrial tachycardia.  Frequent supraventricular ectopy, 8.7%  Rare ventricular ectopy.  No sustained arrhythmias.  No atrial fibrillation   Discussed recent events, spend his holidays in [place]  Developed angina 02/21/2024 Was taken to hospital next-door, transferred to higher tertiary care hospital for cardiac  catheterization Left heart cath shows critical 90% complex proximal -mid LAD stenosis. drug-eluting stent placement , 2.75 x 15 mm xience skypoint Currently on dual antiplatelet therapy should continue for 1 year. Minimal disease in the proximal RCA  Echocardiogram reviewed from hospital stay on day of his catheterization December 22 showing normal EF with no focal wall motion abnormality  Since his return to [place] , feels well Radial access site well-healed Tolerating aspirin , Plavix  Reports he was started on Lipitor 80  Walks for exercise, denies shortness of breath on exertion When sitting developed symptoms of shortness of breath Has lower extremity pitting edema, 1+ above the sock line  EKG personally reviewed by myself on todays visit EKG Interpretation Date/Time:  Monday March 06 2024 15:02:52 EST Ventricular Rate:  61 PR Interval:  160 QRS Duration:  138 QT Interval:  500 QTC Calculation: 503 R Axis:   -35  Text Interpretation: Sinus rhythm with Premature atrial complexes Left axis deviation Right bundle branch block Minimal voltage criteria for LVH, may be normal variant ( R in aVL ) When compared with ECG of 30-Dec-2022 11:24, Premature atrial complexes are now Present Confirmed by Perla Lye [phone]) on 03/06/2024 3:05:58 PM     PMH:   has a past medical history of Arthritis, Back pain, Complication of anesthesia, Coronary artery disease, DVT (deep venous thrombosis) (HCC) (2017), Hyperlipidemia (03/05/2024), Hypothyroidism, Oral cancer (HCC), Paroxysmal atrial fibrillation (HCC), Prostatitis, Scoliosis, Thrombocytopenia, and Venous insufficiency.   PSH:    Past  Surgical History:  Procedure Laterality Date   CATARACT EXTRACTION Bilateral    KNEE ARTHROSCOPY     KNEE ARTHROSCOPY WITH MEDIAL MENISECTOMY Right 01/09/2020   Procedure: RIGHT KNEE ARTHROSCOPY WITH MEDIAL MENISECTOMY;  Surgeon: Marchia Drivers, MD;  Location: [facility];  Service:  Orthopedics;  Laterality: Right;   LEFT HEART CATH     Stent placement in [place] 2025.   Oral biopsy     PILONIDAL CYST EXCISION     PROSTATE BIOPSY     Negative   SEPTOPLASTY     SHOULDER ARTHROSCOPY WITH ROTATOR CUFF REPAIR AND SUBACROMIAL DECOMPRESSION Right 07/02/2020   Procedure: RIGHT SHOULDER ARTHROSCOPY  DISTAL CLAVICLE EXCISION, BICEPS TENODESIS, SUBACROMIAL DECOMPRESSION, ANTERIOR LABRAL REPAIR. AND MINI OPEN ROTATOR CUFF REPAIR;  Surgeon: Krasinski, Kevin, MD;  Location: [facility];  Service: Orthopedics;  Laterality: Right;   TONSILLECTOMY      Current Outpatient Medications  Medication Sig Dispense Refill   albuterol  (VENTOLIN  HFA) 108 (90 Base) MCG/ACT inhaler INHALE ONE TO TWO PUFFS BY MOUTH EVERY 6 HOURS AS NEEDED 8.5 g 2   alfuzosin  (UROXATRAL ) 10 MG 24 hr tablet Take 1 tablet (10 mg total) by mouth daily. 90 tablet 3   aspirin  EC (ASPIRIN  LOW DOSE) 81 MG tablet TAKE 1 TABLET BY MOUTH DAILY SWALLOW WHOLE 90 tablet 3   atorvastatin  (LIPITOR) 80 MG tablet Take 80 mg by mouth daily.     clopidogrel  (PLAVIX ) 75 MG tablet Take 75 mg by mouth daily.     CRANBERRY EXTRACT PO Take 500 mg by mouth daily.     famotidine  (PEPCID ) 20 MG tablet Take 1 tablet (20 mg total) by mouth daily as needed (for hiccups or GERD.).     fluticasone  (FLONASE ) 50 MCG/ACT nasal spray Place 2 sprays into both nostrils daily. 16 g 12   levothyroxine  (SYNTHROID ) 88 MCG tablet Take 1 tablet (88 mcg total) by mouth daily. 90 tablet 3   Lutein 20 MG CAPS Take by mouth daily.     Melatonin 1 MG CAPS Take 1-3 mg by mouth at bedtime as needed (for sleep).     Multiple Vitamins-Minerals (SENIOR MULTIVITAMIN PLUS PO) Take 1 tablet by mouth daily.     polyethylene glycol powder (GLYCOLAX /MIRALAX ) 17 GM/SCOOP powder Take 17 g by mouth every other day.     sildenafil  (VIAGRA ) 100 MG tablet Take 1 tablet (100 mg total) by mouth daily as needed for erectile dysfunction. 30 tablet 11   No current  facility-administered medications for this visit.    Allergies:   Bactrim [sulfamethoxazole-trimethoprim], Ciprofloxacin, Levofloxacin, Fish protein-containing drug products, Fish oil, and Ibuprofen   Social History:  The patient  reports that he has never smoked. He has never been exposed to tobacco smoke. He has never used smokeless tobacco. He reports that he does not drink alcohol and does not use drugs.   Family History:   family history includes Atrial fibrillation in his sister and sister; Hearing loss in his father; Heart attack in his brother; Heart disease in his mother; High Cholesterol in his father; High blood pressure in his father; Parkinson's disease in his father; Parkinsonism in his father; Prostate cancer in his paternal grandfather; Stroke in his father; Varicose Veins in his mother.    Review of Systems: Review of Systems  Constitutional: Negative.   HENT: Negative.    Respiratory: Negative.    Cardiovascular: Negative.   Gastrointestinal: Negative.   Musculoskeletal: Negative.   Neurological: Negative.   Psychiatric/Behavioral: Negative.  All other systems reviewed and are negative.   PHYSICAL EXAM: VS:  BP 136/76 (BP Location: Left Arm, Patient Position: Sitting, Cuff Size: Normal)   Pulse 61   Ht 5' 10 (1.778 m)   Wt 175 lb 2 oz (79.4 kg)   SpO2 97%   BMI 25.13 kg/m  , BMI Body mass index is 25.13 kg/m. GEN: Well nourished, well developed, in no acute distress HEENT: normal Neck: no JVD, carotid bruits, or masses Cardiac: RRR; no murmurs, rubs, or gallops,no edema  Respiratory:  clear to auscultation bilaterally, normal work of breathing GI: soft, nontender, nondistended, + BS MS: no deformity or atrophy Skin: warm and dry, no rash Neuro:  Strength and sensation are intact Psych: euthymic mood, full affect  Recent Labs: 11/02/2023: ALT 15; BUN 27; Creatinine, Ser 1.19; Hemoglobin 15.2; Platelets 107.0; Potassium 4.0; Sodium 140; TSH 3.73     Lipid Panel Lab Results  Component Value Date   CHOL 145 11/02/2023   HDL 62.40 11/02/2023   LDLCALC 72 11/02/2023   TRIG 52.0 11/02/2023    Wt Readings from Last 3 Encounters:  03/06/24 175 lb 2 oz (79.4 kg)  02/10/24 174 lb 6 oz (79.1 kg)  11/30/23 166 lb (75.3 kg)     ASSESSMENT AND PLAN:  Problem List Items Addressed This Visit       Cardiology Problems   Coronary artery disease involving native coronary artery of native heart - Primary   Relevant Medications   atorvastatin  (LIPITOR) 80 MG tablet   Other Relevant Orders   EKG 12-Lead (Completed)   Paroxysmal atrial fibrillation (HCC)   Relevant Medications   atorvastatin  (LIPITOR) 80 MG tablet   Other Relevant Orders   EKG 12-Lead (Completed)   Essential hypertension   Relevant Medications   atorvastatin  (LIPITOR) 80 MG tablet   Other Relevant Orders   EKG 12-Lead (Completed)   Non-STEMI Coronary artery disease with stable angina Stent placed to proximal-mid LAD 2.5 x 15 mm Xience -Recommend he continue aspirin  Plavix , refills provided - Given statin nave, Lipitor down to 40 daily - Not on beta-blocker given baseline bradycardia - Echo with no focal wall motion abnormality  Paroxysmal atrial fibrillation/SVT - Managed by EP, not on beta-blocker secondary to baseline bradycardia - No recent events - Has follow-up with EP March 2026  Hyperlipidemia Curiously had relatively well-controlled cholesterol prior to recent non-STEMI -Given statin nave, recommend he decrease Lipitor down to 40 daily, lipid panel in several months time  Essential hypertension Blood pressure is well controlled on today's visit. No changes made to the medications.  Shortness of breath at rest/lower extremity edema Unable to exclude fluid retention versus venous insufficiency Recommend compression hose, Lasix  20 mg 3 days a week with potassium 20 on days with Lasix   Signed, Velinda Lunger, M.D., Ph.D. [facility] [place], [place] [phone]

## 2024-03-06 ENCOUNTER — Encounter: Payer: Self-pay | Admitting: Cardiovascular Disease

## 2024-03-06 ENCOUNTER — Ambulatory Visit: Attending: Cardiovascular Disease | Admitting: Cardiovascular Disease

## 2024-03-06 VITALS — BP 136/76 | HR 61 | Ht 70.0 in | Wt 175.1 lb

## 2024-03-06 DIAGNOSIS — I480 Paroxysmal atrial fibrillation: Secondary | ICD-10-CM | POA: Diagnosis not present

## 2024-03-06 DIAGNOSIS — I10 Essential (primary) hypertension: Secondary | ICD-10-CM | POA: Diagnosis not present

## 2024-03-06 DIAGNOSIS — I25118 Atherosclerotic heart disease of native coronary artery with other forms of angina pectoris: Secondary | ICD-10-CM | POA: Insufficient documentation

## 2024-03-06 MED ORDER — CLOPIDOGREL BISULFATE 75 MG PO TABS: 75.0000 mg | ORAL_TABLET | Freq: Every day | ORAL | 3 refills | Status: AC

## 2024-03-06 MED ORDER — POTASSIUM CHLORIDE CRYS ER 20 MEQ PO TBCR: 20.0000 meq | EXTENDED_RELEASE_TABLET | ORAL | 3 refills | Status: AC

## 2024-03-06 MED ORDER — FUROSEMIDE 20 MG PO TABS: 20.0000 mg | ORAL_TABLET | ORAL | 3 refills | Status: AC

## 2024-03-06 MED ORDER — ATORVASTATIN CALCIUM 40 MG PO TABS: 40.0000 mg | ORAL_TABLET | Freq: Every day | ORAL | 3 refills | Status: AC

## 2024-03-06 NOTE — Patient Instructions (Addendum)
 Medication Instructions:   Please decrease the lipitor down to 40 mg daily  Lasix  20 mg daily with potassium 20 meq Monday, Wednesday, Friday  If you need a refill on your cardiac medications before your next appointment, please call your pharmacy.   Lab work: No new labs needed  Testing/Procedures: No new testing needed  Follow-Up: At [facility], you and your health needs are our priority.  As part of our continuing mission to provide you with exceptional heart care, we have created designated Provider Care Teams.  These Care Teams include your primary Cardiologist (physician) and Advanced Practice Providers (APPs -  Physician Assistants and Nurse Practitioners) who all work together to provide you with the care you need, when you need it.  You will need a follow up appointment in 12 months  Providers on your designated Care Team:   Lonni Meager, NP Bernardino Bring, PA-C [place]ence [place], [place]  COVID-19 Vaccine Information can be found at: [email or web address] For questions related to vaccine distribution or appointments, please email [email or web address] or call [phone].

## 2024-03-07 ENCOUNTER — Ambulatory Visit: Admitting: Family Medicine

## 2024-03-07 ENCOUNTER — Encounter: Payer: Self-pay | Admitting: Family Medicine

## 2024-03-07 VITALS — BP 138/72 | HR 57 | Temp 97.9°F | Ht 70.0 in | Wt 175.0 lb

## 2024-03-07 DIAGNOSIS — I2510 Atherosclerotic heart disease of native coronary artery without angina pectoris: Secondary | ICD-10-CM

## 2024-03-07 NOTE — Patient Instructions (Addendum)
 Recheck labs in about 2-3 months at a fasting lab visit.  Update me as needed.  Take care.  Glad to see you.

## 2024-03-07 NOTE — Progress Notes (Signed)
 Inpatient course discussed with patient.  He had unexpected chest pain while out of town with positive cardiac workup requiring stent placement.  Rationale for current medications discussed with patient.  He had cardiology follow-up in the meantime.  He is not having chest pain. D/w pt about deferring umbilical hernia repair for now.   D/w pt about gradual return to exercise/boxing class.    He still has some hiccups.  D/w pt about not using omeprazole when taking Plavix ..    Discussed with patient about rationale for Lasix  use and timing of onset, etc.  Meds, vitals, and allergies reviewed.   ROS: Per HPI unless specifically indicated in ROS section   Nad Ncat Neck supple no LA Rrr Ctab Abd soft, not ttp Skin well perfused.  Resolving bruising on the arm.  Normal radial pulse B.  Trace BLE edema- he is going to try lasix  soon.

## 2024-03-09 NOTE — Progress Notes (Signed)
 "  Assessment/Plan:   Parkinson's disease  - Do think he has more evidence of clinical disease now.  -DaTscan  minimally positive.  DaTscan  was reported to show evidence of symmetric decreased radiotracer in the left and right putamen.  This was mildly so.  -Skin biopsy for alpha-synuclein was negative  - Discussed with him today that I really do think he should trial medication.  Discussed my reasoning for that.  Ultimately, he decided to hold off on medication.  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  He does follow with ENT and has done speech therapy with success.  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  I am proud of him for that.  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  History of paroxysmal A-fib  - Patient noted episodes while wearing Apple Watch and is currently wearing Zio patch to get a better idea on what is going on.  Subjective:   Norman Mitchell was seen today in follow up for history of parkinsonism.  Patient with his wife who supplements the history.  In the past, his DaTscan  was minimally positive and skin biopsy was negative.  We have just been following him clinically.  Tremors are about the same.  He is dragging the feet but no falls.  Walking and RSB for exercise.  Patient has been doing fairly well from my standpoint, but unfortunately patient was recently admitted just before Christmas with an NSTEMI.   CURRENT MEDICATIONS:  Outpatient Encounter Medications as of 03/10/2024  Medication Sig   albuterol  (VENTOLIN  HFA) 108 (90 Base) MCG/ACT inhaler INHALE ONE TO TWO PUFFS BY MOUTH EVERY 6 HOURS AS NEEDED   alfuzosin  (UROXATRAL ) 10 MG 24 hr tablet Take 1 tablet (10 mg total) by mouth daily.   aspirin  EC (ASPIRIN  LOW DOSE) 81 MG tablet TAKE 1 TABLET BY MOUTH DAILY SWALLOW WHOLE   atorvastatin  (LIPITOR) 40 MG tablet Take 1 tablet (40 mg  total) by mouth daily.   clopidogrel  (PLAVIX ) 75 MG tablet Take 1 tablet (75 mg total) by mouth daily.   CRANBERRY EXTRACT PO Take 500 mg by mouth daily.   famotidine  (PEPCID ) 20 MG tablet Take 1 tablet (20 mg total) by mouth daily as needed (for hiccups or GERD.).   fluticasone  (FLONASE ) 50 MCG/ACT nasal spray Place 2 sprays into both nostrils daily.   furosemide  (LASIX ) 20 MG tablet Take 1 tablet (20 mg total) by mouth 3 (three) times a week. Take Mon, Wed and Friday   levothyroxine  (SYNTHROID ) 88 MCG tablet Take 1 tablet (88 mcg total) by mouth daily.   Lutein 20 MG CAPS Take by mouth daily.   melatonin 3 MG TABS tablet Take 3 mg by mouth at bedtime.   Multiple Vitamins-Minerals (SENIOR MULTIVITAMIN PLUS PO) Take 1 tablet by mouth daily.   polyethylene glycol powder (GLYCOLAX /MIRALAX ) 17 GM/SCOOP powder Take 17 g by mouth every other day.   potassium chloride  SA (KLOR-CON  M20) 20 MEQ tablet Take 1 tablet (20 mEq total) by mouth 3 (three) times a week. Take Mon, Wed and Friday   sildenafil  (VIAGRA ) 100 MG tablet Take 1 tablet (100 mg total) by mouth daily as needed for erectile dysfunction.   No facility-administered encounter medications on file as of 03/10/2024.     Objective:   PHYSICAL EXAMINATION:    VITALS:   Vitals:   03/10/24  1410  BP: 124/84  Pulse: 71  SpO2: 99%  Weight: 197 lb (89.4 kg)  Height: 5' 9 (1.753 m)     GEN:  The patient appears stated age and is in NAD. HEENT:  Normocephalic, atraumatic.  The mucous membranes are moist. The superficial temporal arteries are without ropiness or tenderness. CV: RRR Lungs:  CTAB Neck/HEME:  There are no carotid bruits bilaterally.  Neurological examination:  Orientation: The patient is alert and oriented x3. Cranial nerves: There is good facial symmetry. there was facial hypomimia.  The speech is fluent and hypophonic. Soft palate rises symmetrically and there is no tongue deviation. Hearing is intact to conversational  tone. Sensation: Sensation is intact to light touch throughout Motor: Strength is at least antigravity x4.  Movement examination: Tone: There is nl tone in the bilateral upper extremities.  The tone in the lower extremities is nl.  Abnormal movements: there is RUE rest tremor; there is rare LLE rest tremor Coordination:  There is mild decremation with hand opening and closing on the R Gait and Station: The patient has no  difficulty arising out of a deep-seated chair without the use of the hands. The patient's stride length is good, although he is slightly forward flexed.  Min trouble with the R leg in the turn.   Arm swing is good.      Total time spent on today's visit was 30 minutes, including both face-to-face time and nonface-to-face time.  Time included that spent on review of records (prior notes available to me/labs/imaging if pertinent), discussing treatment and goals, answering patient's questions and coordinating care.  Cc:  Cleatus Arlyss RAMAN, MD  "

## 2024-03-10 ENCOUNTER — Ambulatory Visit (INDEPENDENT_AMBULATORY_CARE_PROVIDER_SITE_OTHER): Admitting: Neurology

## 2024-03-10 ENCOUNTER — Encounter: Payer: Self-pay | Admitting: Neurology

## 2024-03-10 VITALS — BP 124/84 | HR 71 | Ht 69.0 in | Wt 197.0 lb

## 2024-03-10 DIAGNOSIS — G20A1 Parkinson's disease without dyskinesia, without mention of fluctuations: Secondary | ICD-10-CM

## 2024-03-10 NOTE — Patient Instructions (Signed)
 Thank you for your contribution to the movement fund! It was good to see you today!  The physicians and staff at [facility] are committed to providing excellent care. You may receive a survey requesting feedback about your experience at our office. We strive to receive very good responses to the survey questions. If you feel that your experience would prevent you from giving the office a very good  response, please contact our office to try to remedy the situation. We may be reached at [phone]. Thank you for taking the time out of your busy day to complete the survey.

## 2024-03-12 NOTE — Assessment & Plan Note (Signed)
 Doing well at this point.  See above.  No chest pain.  Okay for outpatient follow-up.  Continue aspirin  and Plavix  for now with plan to taper to single antiplatelet agent after 1 year. Recheck labs in about 2-3 months at a fasting lab visit.  Update me as needed.  He agrees to plan.  I thank all involved.

## 2024-04-10 ENCOUNTER — Ambulatory Visit: Admitting: Family Medicine

## 2024-05-23 ENCOUNTER — Ambulatory Visit: Admitting: Cardiology

## 2024-05-29 ENCOUNTER — Other Ambulatory Visit

## 2024-06-28 ENCOUNTER — Ambulatory Visit: Admitting: Urology

## 2024-06-29 ENCOUNTER — Ambulatory Visit: Admitting: Urology

## 2024-09-07 ENCOUNTER — Ambulatory Visit: Payer: Self-pay | Admitting: Neurology
